# Patient Record
Sex: Female | Born: 1965 | Race: White | Hispanic: No | Marital: Single | Smoking: Never smoker
Health system: Southern US, Community
[De-identification: ages and names within clinical notes are randomized; demographics above are authoritative.]

## PROBLEM LIST (undated history)

## (undated) DIAGNOSIS — H5213 Myopia, bilateral: Secondary | ICD-10-CM

## (undated) DIAGNOSIS — R011 Cardiac murmur, unspecified: Secondary | ICD-10-CM

## (undated) DIAGNOSIS — T7840XA Allergy, unspecified, initial encounter: Secondary | ICD-10-CM

## (undated) DIAGNOSIS — Q909 Down syndrome, unspecified: Secondary | ICD-10-CM

## (undated) DIAGNOSIS — B019 Varicella without complication: Secondary | ICD-10-CM

## (undated) DIAGNOSIS — F79 Unspecified intellectual disabilities: Secondary | ICD-10-CM

## (undated) HISTORY — DX: Allergy, unspecified, initial encounter: T78.40XA

## (undated) HISTORY — DX: Down syndrome, unspecified: Q90.9

## (undated) HISTORY — DX: Cardiac murmur, unspecified: R01.1

## (undated) HISTORY — DX: Varicella without complication: B01.9

---

## 2011-11-18 HISTORY — PX: KNEE SURGERY: SHX244

## 2013-12-14 ENCOUNTER — Encounter: Payer: Self-pay | Admitting: Physician Assistant

## 2013-12-14 ENCOUNTER — Ambulatory Visit (INDEPENDENT_AMBULATORY_CARE_PROVIDER_SITE_OTHER): Payer: PRIVATE HEALTH INSURANCE | Admitting: Physician Assistant

## 2013-12-14 VITALS — BP 132/74 | HR 96 | Temp 97.4°F | Resp 16 | Wt 174.0 lb

## 2013-12-14 DIAGNOSIS — R4689 Other symptoms and signs involving appearance and behavior: Secondary | ICD-10-CM

## 2013-12-14 DIAGNOSIS — Z0000 Encounter for general adult medical examination without abnormal findings: Secondary | ICD-10-CM

## 2013-12-14 DIAGNOSIS — Q909 Down syndrome, unspecified: Secondary | ICD-10-CM

## 2013-12-14 DIAGNOSIS — F3289 Other specified depressive episodes: Secondary | ICD-10-CM

## 2013-12-14 DIAGNOSIS — F919 Conduct disorder, unspecified: Secondary | ICD-10-CM

## 2013-12-14 DIAGNOSIS — F32A Depression, unspecified: Secondary | ICD-10-CM

## 2013-12-14 DIAGNOSIS — R413 Other amnesia: Secondary | ICD-10-CM

## 2013-12-14 DIAGNOSIS — F329 Major depressive disorder, single episode, unspecified: Secondary | ICD-10-CM

## 2013-12-14 DIAGNOSIS — Z23 Encounter for immunization: Secondary | ICD-10-CM

## 2013-12-14 NOTE — Progress Notes (Signed)
Subjective:   Patient ID: Jasmine Ramirez, female    DOB: 08-Dec-1965, 48 y.o.   MRN: [id number]  HPI Comments: Patient is a 48 year old female who presents to the clinic to establish care. Patient has Down syndrome, is accompanied by her sister, Miki Kinsickey, who is primary care giver and historian. Sister reports she and her husband and patient have recently relocated to this area. Relocated from [place] where patient's father has remained. Father was handling patient's needs for most part however, is aging and less able to do all that is required.  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 Since the move patient does not want to be working and sister explains patient is acting out by lying, throwing away keepsakes, throwing away/ripping up bus tickets, having thought that her father is in the attic and left without saying good bye to her etc. Sister has concerns patient may be experiencing depression. While in [place] patient's provider was starting process to do "baseline Alzheimer's testing" and the sister would like to initiate the process her, stating patient seems to have some memory issues. Family will sign for record release and is requesting referral to GYN, Neurologist and Behavior Medicine. Last mammogram in 2014. Would like flu vaccine.    Review of Systems  Constitutional: Negative for chills and fatigue.  HENT: Negative for ear pain and trouble swallowing.        Wears hearing aids, patient threw out  Eyes: Negative for pain and visual disturbance.  Respiratory: Negative for cough.   Cardiovascular: Negative for chest pain and palpitations.  Gastrointestinal: Negative for nausea, diarrhea and constipation.  Genitourinary: Negative for dysuria.  Skin: Negative for rash.  Neurological: Negative for dizziness, weakness, light-headedness and numbness.  All other  systems reviewed and are negative.      Objective:   Physical Exam  Vitals reviewed. Constitutional: She appears well-developed and well-nourished. No distress.  HENT:  Head: Normocephalic and atraumatic.  Right Ear: Tympanic membrane, external ear and ear canal normal.  Left Ear: Tympanic membrane, external ear and ear canal normal.  Nose: Nose normal.  Mouth/Throat: Uvula is midline, oropharynx is clear and moist and mucous membranes are normal.  Cerumen in right canal removed with loop, patient tolerated well.   Eyes: Conjunctivae are normal. Pupils are equal, round, and reactive to light.  Unable to perform EOM   Neck: Trachea normal and normal range of motion.  Cardiovascular: Normal rate and regular rhythm.  Exam reveals no gallop and no friction rub.   No murmur heard. Pulses:      Radial pulses are 2+ on the right side, and 2+ on the left side.       Posterior tibial pulses are 2+ on the right side, and 2+ on the left side.  Pulmonary/Chest: Effort normal and breath sounds normal. She has no wheezes. She has no rhonchi. She has no rales.  Abdominal: Soft. Normal appearance and bowel sounds are normal. There is no tenderness.  Genitourinary:  Deferred to GYN  Musculoskeletal: Normal range of motion.  FROM U/LE bilateral  Lymphadenopathy:    She has no cervical adenopathy.       Right: No supraclavicular adenopathy present.       Left: No supraclavicular adenopathy present.  Neurological: She is alert. She has normal strength. Coordination and gait normal.  Reflex  Scores:      Patellar reflexes are 2+ on the right side and 2+ on the left side. Skin: Skin is warm and dry.  Psychiatric: She has a normal mood and affect. Her behavior is normal.   Past Medical History  Diagnosis Date  . Chicken pox   . Allergy   . Heart murmur   . Down syndrome    Family History  Problem Relation Age of Onset  . Cancer Father     Prostate  . Diabetes Father   . Cancer Maternal  Grandmother     Breast  . Cancer Paternal Grandfather     Colon   History   Social History  . Marital Status: Single    Spouse Name: N/A    Number of Children: N/A  . Years of Education: N/A   Social History Main Topics  . Smoking status: Never Smoker   . Smokeless tobacco: None  . Alcohol Use: None  . Drug Use: None  . Sexual Activity: None   Other Topics Concern  . None   Social History Narrative  . None   Past Surgical History  Procedure Laterality Date  . Knee surgery  2013      Assessment & Plan:   Patient here to establish care.   Labs ordered and will be reviewed.  Flu vaccine updated.  Patient here with sister, sister very concerned regarding patients recent behavior and acting up.  Discussed counseling for patient and sister, sister states was hoping they could do this, is apprehensive to start oral medications. Counseled sister on use of Debrox to assist in preventing cerumen impaction. Patient recently seen at ENT for impaction removal.   Provided referral to  GYN Neuro - for alzheimer assessment, non -acute Behavior medicine

## 2013-12-14 NOTE — Patient Instructions (Signed)
It was great meeting you today Victorino DikeJennifer and WiltonNickey!  I have made referrals as discussed. Depression, Adult Depression is feeling sad, low, down in the dumps, blue, gloomy, or empty. In general, there are two kinds of depression:  Normal sadness or grief. This can happen after something upsetting. It often goes away on its own within 2 weeks. After losing a loved one (bereavement), normal sadness and grief may last longer than two weeks. It usually gets better with time.  Clinical depression. This kind lasts longer than normal sadness or grief. It keeps you from doing the things you normally do in life. It is often hard to function at home, work, or at school. It may affect your relationships with others. Treatment is often needed. GET HELP RIGHT AWAY IF:  You have thoughts about hurting yourself or others.  You lose touch with reality (psychotic symptoms). You may:  See or hear things that are not real.  Have untrue beliefs about your life or people around you.  Your medicine is giving you problems. MAKE SURE YOU:  Understand these instructions.  Will watch your condition.  Will get help right away if you are not doing well or get worse. Document Released: 12/06/2010 Document Revised: 07/28/2012 Document Reviewed: 03/04/2012 [facility] Patient Information 2014 [place] [place].

## 2013-12-14 NOTE — Progress Notes (Signed)
Pre-visit discussion using our clinic review tool. No additional management support is needed unless otherwise documented below in the visit note.  

## 2014-01-03 ENCOUNTER — Ambulatory Visit: Payer: PRIVATE HEALTH INSURANCE | Admitting: Psychiatry

## 2014-01-11 ENCOUNTER — Ambulatory Visit: Payer: PRIVATE HEALTH INSURANCE | Admitting: Psychiatry

## 2014-01-12 ENCOUNTER — Ambulatory Visit: Payer: PRIVATE HEALTH INSURANCE | Admitting: Psychiatry

## 2014-01-25 ENCOUNTER — Ambulatory Visit (INDEPENDENT_AMBULATORY_CARE_PROVIDER_SITE_OTHER): Payer: PRIVATE HEALTH INSURANCE | Admitting: Neurology

## 2014-01-25 ENCOUNTER — Encounter: Payer: Self-pay | Admitting: Neurology

## 2014-01-25 VITALS — BP 118/68 | HR 70 | Temp 98.5°F | Resp 16 | Ht <= 58 in | Wt 175.7 lb

## 2014-01-25 DIAGNOSIS — F919 Conduct disorder, unspecified: Secondary | ICD-10-CM

## 2014-01-25 DIAGNOSIS — R4689 Other symptoms and signs involving appearance and behavior: Secondary | ICD-10-CM

## 2014-01-25 DIAGNOSIS — F329 Major depressive disorder, single episode, unspecified: Secondary | ICD-10-CM

## 2014-01-25 DIAGNOSIS — F32A Depression, unspecified: Secondary | ICD-10-CM

## 2014-01-25 DIAGNOSIS — R4189 Other symptoms and signs involving cognitive functions and awareness: Secondary | ICD-10-CM

## 2014-01-25 DIAGNOSIS — F3289 Other specified depressive episodes: Secondary | ICD-10-CM

## 2014-01-25 DIAGNOSIS — Q909 Down syndrome, unspecified: Secondary | ICD-10-CM

## 2014-01-25 NOTE — Patient Instructions (Addendum)
I would first focus on treating the depression first, particularly since these symptoms started after moving.  We will check a B12 and thyroid levels.  I would like to re-evaluate in 6 months.  Call with questions or concerns.

## 2014-01-25 NOTE — Progress Notes (Signed)
NEUROLOGY CONSULTATION NOTE  Jasmine Ramirez MRN: [id number] DOB: 05/22/1966  Referring provider: Baltazar Apo, PA-C Primary care provider: Baltazar Apo, PA-C  Reason for consult:  Evaluate cognition.  HISTORY OF PRESENT ILLNESS: Jasmine Ramirez is a 48 year old right-handed woman with history of Down syndrome who presents for evaluation of memory problems.  She is accompanied by her sister..  Records and images were personally reviewed where available.    For over 20 years, she lived in an independent assisted living group home in [place].  She was very social and engaged in many activities.  She also worked part-time as a Public affairs consultant at [employer].  Her father was her primary caregiver and would take her to all her appointments.  At baseline, she is able to communicate fairly well.  She is able to read and write short phrases.  She is oriented to time and place.    She moved to [place] from [place] with her sister, brother-in-law and nephew back in August 2014.  Since her move, she has been a little more depressed.  Her nephew moved out and joined the [employer] in January 2015.  Around this time, she had a drastic decline in cognition and behavior.  After her nephew left, she would accuse him of having stolen her possessions.  Other times, she would be screaming out for him.  She would speak to people that weren't there.  One time, she was found in the middle of the night packing her suitcase.  She said she was packing for a trip.  She refuses to wear her hearing aids.  She is more fatigued.  She was ripping up bus tickets and throws away pictures.  When she is confronted, she will say "I don't know; I'm confused, I'm mixed up; I'm sorry, I apologize."  She is not oriented to time or place as much.  She has been much more depressed.  She wasn't eating as much.  She would go upstairs to her room and not watch her favorite TV shows.  She still is obsessive about keeping her  room clean.  She does not work, but she goes to [employer].  A caretaker was hired to come in the mornings.  She is able to perform her ADLs and the caregiver makes her breakfast.  They will talk or go for walks.  Over the past few weeks, her symptoms have improved and leveled off, but she is still not at baseline as she was prior to moving in August.  PAST MEDICAL HISTORY: Past Medical History  Diagnosis Date  . Chicken pox   . Allergy   . Heart murmur   . Down syndrome   . Hearing loss     PAST SURGICAL HISTORY: Past Surgical History  Procedure Laterality Date  . Knee surgery  2013    MEDICATIONS: Current Outpatient Prescriptions on File Prior to Visit  Medication Sig Dispense Refill  . Cetirizine HCl (ZYRTEC PO) Take by mouth.      . Multiple Vitamins-Minerals (MULTIVITAMIN PO) Take by mouth.       No current facility-administered medications on file prior to visit.    ALLERGIES: No Known Allergies  FAMILY HISTORY: Family History  Problem Relation Age of Onset  . Cancer Father     Prostate  . Diabetes Father   . Cancer Maternal Grandmother     Breast  . Cancer Paternal Grandfather     Colon    SOCIAL HISTORY: History  Social History  . Marital Status: Single    Spouse Name: N/A    Number of Children: N/A  . Years of Education: N/A   Occupational History  . Not on file.   Social History Main Topics  . Smoking status: Never Smoker   . Smokeless tobacco: Not on file  . Alcohol Use: Not on file  . Drug Use: Not on file  . Sexual Activity: Not on file   Other Topics Concern  . Not on file   Social History Narrative  . No narrative on file    REVIEW OF SYSTEMS: Constitutional: No fevers, chills, or sweats, no generalized fatigue, change in appetite Eyes: No visual changes, double vision, eye pain Ear, nose and throat: No hearing loss, ear pain, nasal congestion, sore throat Cardiovascular: No chest pain, palpitations Respiratory:  No shortness of  breath at rest or with exertion, wheezes GastrointestinaI: No nausea, vomiting, diarrhea, abdominal pain, fecal incontinence Genitourinary:  No dysuria, urinary retention or frequency Musculoskeletal:  No neck pain, back pain Integumentary: No rash, pruritus, skin lesions Neurological: as above Psychiatric: No depression, insomnia, anxiety Endocrine: No palpitations, fatigue, diaphoresis, mood swings, change in appetite, change in weight, increased thirst Hematologic/Lymphatic:  No anemia, purpura, petechiae. Allergic/Immunologic: no itchy/runny eyes, nasal congestion, recent allergic reactions, rashes  PHYSICAL EXAM: Filed Vitals:   01/25/14 1237  BP: 118/68  Pulse: 70  Temp: 98.5 F (36.9 C)  Resp: 16   General: No acute distress Head:  Normocephalic/atraumatic Neck: supple, no paraspinal tenderness, full range of motion Back: No paraspinal tenderness Heart: regular rate and rhythm Lungs: Clear to auscultation bilaterally. Vascular: No carotid bruits. Neurological Exam: Mental status: alert and oriented to person, day of week and month, but not year or place.  Speech fluent.  She was able to name, repeat, and read.  Unable to follow 3 step commands across midline.  Unable to spell WORLD backwards but can spell simpler words such as CAT backwards (which is baseline).  Unable to recall 3 words after a couple of minutes.  Able to name.  Unable to repeat "no ifs, ands and buts".  Able to write her name but did not write a brief sentence as requested.  Able to copy intersecting pentagons correctly.  MMSE 7/30 Cranial nerves: CN I: not tested CN II: pupils equal, round and reactive to light, visual fields intact, fundi unremarkable. CN III, IV, VI:  full range of motion, no nystagmus, no ptosis CN V: facial sensation intact CN VII: upper and lower face symmetric CN VIII: hearing reduced bilaterally CN IX, X: gag intact, uvula midline CN XI: sternocleidomastoid and trapezius muscles  intact CN XII: tongue midline Bulk & Tone: normal, no fasciculations. Motor: 5/5 throughout Sensation: pinprick and vibration intact. Deep Tendon Reflexes: 2+ throughout, toes down Finger to nose testing: no dysmetria Gait: mildly wide-based but steady stance and stride.  Able to turn. Romberg negative.  IMPRESSION: Down Syndrome Cognitive impairment and behavioral changes.  At this point, I would favor that it is related to depression over dementia.  The sudden onset of symptoms timed with multiple changes, such as moving to another state, moving away from her father, leaving her friends and job, and her nephew leaving, have likely contributed to this.  Also, it has improved (although not at baseline) over the past few weeks.  PLAN: 1.  I would like to see her in 6 months after having started therapy with Behavioral Medicine for a re-evaluation. 2.  Check B12  and TSH.  60 minutes spent with patient, over 50% spent counseling and coordinating care.  Thank you for allowing me to take part in the care of this patient.  Shon Millet, DO  CC:  Baltazar Apo, PA-C

## 2014-01-26 LAB — VITAMIN B12: Vitamin B-12: 308 pg/mL (ref 211–911)

## 2014-01-26 LAB — TSH: TSH: 3.034 u[IU]/mL (ref 0.350–4.500)

## 2014-01-27 LAB — METHYLMALONIC ACID, SERUM: Methylmalonic Acid, Quant: 0.35 umol/L (ref <0.40)

## 2014-01-31 ENCOUNTER — Ambulatory Visit (INDEPENDENT_AMBULATORY_CARE_PROVIDER_SITE_OTHER): Payer: PRIVATE HEALTH INSURANCE | Admitting: Psychiatry

## 2014-01-31 DIAGNOSIS — F4324 Adjustment disorder with disturbance of conduct: Secondary | ICD-10-CM

## 2014-02-01 ENCOUNTER — Encounter: Payer: TRICARE For Life (TFL) | Admitting: Obstetrics & Gynecology

## 2014-02-22 ENCOUNTER — Ambulatory Visit (INDEPENDENT_AMBULATORY_CARE_PROVIDER_SITE_OTHER): Payer: PRIVATE HEALTH INSURANCE | Admitting: Psychiatry

## 2014-02-22 DIAGNOSIS — F4324 Adjustment disorder with disturbance of conduct: Secondary | ICD-10-CM

## 2014-02-27 ENCOUNTER — Encounter: Payer: Self-pay | Admitting: Internal Medicine

## 2014-02-27 ENCOUNTER — Ambulatory Visit (INDEPENDENT_AMBULATORY_CARE_PROVIDER_SITE_OTHER): Payer: PRIVATE HEALTH INSURANCE | Admitting: Internal Medicine

## 2014-02-27 ENCOUNTER — Telehealth: Payer: Self-pay

## 2014-02-27 ENCOUNTER — Other Ambulatory Visit: Payer: PRIVATE HEALTH INSURANCE

## 2014-02-27 VITALS — BP 120/72 | HR 50 | Temp 98.6°F | Wt 175.5 lb

## 2014-02-27 DIAGNOSIS — H6123 Impacted cerumen, bilateral: Secondary | ICD-10-CM

## 2014-02-27 DIAGNOSIS — Q909 Down syndrome, unspecified: Secondary | ICD-10-CM

## 2014-02-27 DIAGNOSIS — H9190 Unspecified hearing loss, unspecified ear: Secondary | ICD-10-CM

## 2014-02-27 DIAGNOSIS — F32A Depression, unspecified: Secondary | ICD-10-CM | POA: Insufficient documentation

## 2014-02-27 DIAGNOSIS — F3289 Other specified depressive episodes: Secondary | ICD-10-CM

## 2014-02-27 DIAGNOSIS — H6120 Impacted cerumen, unspecified ear: Secondary | ICD-10-CM

## 2014-02-27 DIAGNOSIS — F329 Major depressive disorder, single episode, unspecified: Secondary | ICD-10-CM | POA: Insufficient documentation

## 2014-02-27 MED ORDER — PAROXETINE HCL 10 MG PO TABS: 10.0000 mg | ORAL_TABLET | Freq: Every day | ORAL | Status: DC

## 2014-02-27 MED ORDER — PAROXETINE HCL 10 MG PO TABS
10.0000 mg | ORAL_TABLET | Freq: Every day | ORAL | Status: DC
Start: 2014-02-27 — End: 2015-03-26

## 2014-02-27 NOTE — Telephone Encounter (Signed)
MD informed and labs entered.  Tried to call the sister of this patient to inform to bring the patient back in at convenience to do labs.  Had to leave a message.

## 2014-02-27 NOTE — Patient Instructions (Signed)
It was good to see you today.  We have reviewed your prior records including labs and tests today  Test(s) ordered today. Your results will be released to MyChart (or called to you) after review, usually within 72hours after test completion. If any changes need to be made, you will be notified at that same time.  Medications reviewed and updated Begin low-dose generic Paxil 10 mg once daily for depression and behavioral changes - no other changes recommended at this time. Your prescription(s) have been submitted to your pharmacy. Please take as directed and contact our office if you believe you are having problem(s) with the medication(s).  Your ears have been irrigated of wax today -  we'll make referral to audiologist for hearing test and review of hearing aides/ear molds . Our office will contact you regarding appointment(s) once made.  Please schedule followup in 6-8 weeks to review medications , call sooner if problems.

## 2014-02-27 NOTE — Progress Notes (Signed)
Subjective:    Patient ID: Jasmine DodgeJennifer Ramirez, female    DOB: 03/17/1966, 48 y.o.   MRN: [id number]  HPI  Patient here today for follow up.   New to me today - previously seen by [name], PA.    Chronic medical issues reviewed. [name]. Recommendation to check estrogen level to look for any other causes of depression.    Neurology consult 01/2014 reviewed.    Sister reports mood improving, still not at baseline.  Also requesting cerumen removal today.    Past Medical History  Diagnosis Date  . Chicken pox   . Allergy   . Heart murmur   . Down syndrome   . Hearing loss     Review of Systems  Constitutional: Negative for fever, chills, activity change and appetite change.  HENT: Negative for congestion, postnasal drip, sinus pressure and sore throat.   Respiratory: Negative for cough, choking, shortness of breath and wheezing.   Cardiovascular: Negative for chest pain, palpitations and leg swelling.  Gastrointestinal: Negative for nausea, vomiting, diarrhea and constipation.  Musculoskeletal: Negative for arthralgias, back pain and myalgias.  Skin: Negative for rash and wound.  Neurological: Negative for dizziness, seizures, syncope, weakness and headaches.  Psychiatric/Behavioral: Positive for behavioral problems (chronic - improving), dysphoric mood and decreased concentration. Negative for sleep disturbance.       Objective:   Physical Exam  Constitutional: She appears well-developed and well-nourished. No distress.  Typical Down's features -sister at side  HENT:  Head: Normocephalic and atraumatic.  Mouth/Throat: No oropharyngeal exudate.  Bilateral cerumen impaction  Eyes: Conjunctivae are normal. Pupils are equal, round, and reactive to light. Right eye exhibits no discharge. Left eye exhibits no discharge.  Neck: Normal range of motion. Neck supple. No thyromegaly present.  Cardiovascular: Normal  rate, regular rhythm and normal heart sounds.   Pulmonary/Chest: Effort normal and breath sounds normal. No respiratory distress. She has no wheezes.  Abdominal: Soft. Bowel sounds are normal. She exhibits no distension. There is no tenderness.  Musculoskeletal: Normal range of motion. She exhibits no edema and no tenderness.  Lymphadenopathy:    She has no cervical adenopathy.  Neurological: She is alert.  Skin: Skin is warm and dry. No rash noted. She is not diaphoretic.  Psychiatric: She has a normal mood and affect. Her behavior is normal. Judgment and thought content normal.    Wt Readings from Last 3 Encounters:  02/27/14 175 lb 8 oz (79.606 kg)  01/25/14 175 lb 11.2 oz (79.697 kg)  12/14/13 174 lb (78.926 kg)   BP Readings from Last 3 Encounters:  02/27/14 120/72  01/25/14 118/68  12/14/13 132/74   Lab Results  Component Value Date   TSH 3.034 01/25/2014   Procedure: wax removal, bilateral Reason: wax impaction, bilateral Risks and benefits of procedure discussed with the patient who agrees to proceed. Ear(s) irrigated with warm water. Large amount of wax removed. Instrumentation with metal ear loop was performed to accomplish wax removal. the patient tolerated procedure well.     Assessment & Plan:   Problem List Items Addressed This Visit   Depression     sister reports improvements in behavior since initiating counseling with behavioral medicine ( Dr. [name]) Given neurology opinion that depression more prevalent and dementia, we'll initiate SSRI therapy at low dose at this time paxil 10,g qd - we reviewed potential risk/benefit and possible side effects - pt understands and agrees to same  Followup  in 6-8 weeks to review symptoms and titer medication as needed, sister agrees to call if symptoms worse or unimproved No SI/HI evident    Relevant Medications      PARoxetine (PAXIL) tablet   Down syndrome - Primary     Progressive disease with behavioral changes noted  in past 9 months [facility] -felt depression more likely than dementia contributing to underlying behavioral change Review screening labs today to ensure no underlying metabolic abnormality Soft heart murmur without change, consider 2-D echo at future date to watch for MR Sister reports contemplation of [place] back [place] lived for [place] years -will help support medically until which time a decision was made to return there    Relevant Orders      Ambulatory referral to Audiology    Other Visit Diagnoses   Impacted cerumen of both ears        Change in hearing        Relevant Orders       Ambulatory referral to Audiology      Cerumen irrigation performed today, see procedure note above. Will refer to audiology as noted

## 2014-02-27 NOTE — Telephone Encounter (Signed)
The patient called from the lab downstairs hoping to get orders updated.  Please call pt back as soon as possible, thanks!

## 2014-02-27 NOTE — Assessment & Plan Note (Signed)
sister reports improvements in behavior since initiating counseling with behavioral medicine ( Dr. [name]) Given neurology opinion that depression more prevalent and dementia, we'll initiate SSRI therapy at low dose at this time paxil 10,g qd - we reviewed potential risk/benefit and possible side effects - pt understands and agrees to same  Followup in 6-8 weeks to review symptoms and titer medication as needed, sister agrees to call if symptoms worse or unimproved No SI/HI evident

## 2014-02-27 NOTE — Progress Notes (Signed)
Pre visit review using our clinic review tool, if applicable. No additional management support is needed unless otherwise documented below in the visit note. 

## 2014-02-27 NOTE — Assessment & Plan Note (Signed)
Progressive disease with behavioral changes noted in past 9 months since [facility] -felt depression more likely than dementia contributing to underlying behavioral change Review screening labs today to ensure no underlying metabolic abnormality Soft heart murmur without change, consider 2-D echo at future date to watch for MR Sister reports contemplation of [place] lived for [place] years -will help support medically until which time a decision was made to return there

## 2014-08-02 ENCOUNTER — Ambulatory Visit: Payer: TRICARE For Life (TFL) | Admitting: Neurology

## 2015-03-26 ENCOUNTER — Ambulatory Visit (INDEPENDENT_AMBULATORY_CARE_PROVIDER_SITE_OTHER): Payer: PRIVATE HEALTH INSURANCE | Admitting: Internal Medicine

## 2015-03-26 ENCOUNTER — Encounter: Payer: Self-pay | Admitting: Internal Medicine

## 2015-03-26 VITALS — BP 100/80 | HR 56 | Temp 98.5°F | Ht <= 58 in | Wt 177.5 lb

## 2015-03-26 DIAGNOSIS — Q909 Down syndrome, unspecified: Secondary | ICD-10-CM | POA: Diagnosis not present

## 2015-03-26 DIAGNOSIS — F79 Unspecified intellectual disabilities: Secondary | ICD-10-CM | POA: Diagnosis not present

## 2015-03-26 NOTE — Progress Notes (Signed)
Pre visit review using our clinic review tool, if applicable. No additional management support is needed unless otherwise documented below in the visit note. 

## 2015-03-26 NOTE — Progress Notes (Signed)
   Subjective:    Patient ID: Jasmine Ramirez, female    DOB: Apr 13, 1966, 49 y.o.   MRN: [id number]  HPI She is a 49 year old female with Down syndrome. She has [place] in [place] group home [place]. Her father who was her healthcare power of attorney died of leukemia in October 2015 and [place] with her sister Jasmine Ramirez.  She was depressed leaving [place] and with the death of her father. There was difficulty getting coverage for Paxil last Fall and it was never taken. She's on no prescription drugs @ this time.  According to her sister she has no active symptoms.  Document completion to allow continuation of the child support is necessary in reference to her mental ability. Additionally mental status evaluation is needed by psychologist or psychiatrist.   Review of Systems Review of systems cannot be completed. When she is asked any question ; she may reply positively but then quickly says "no, I am fine".  Her sister does not feel she's depressed at this time .       Objective:   Physical Exam Pertinent or positive findings include: Subtle Down's findings facially and of body habitus. Decreased hearing.  Increased second heart sound.  Minimal crepitus of the knees.  General appearance :adequately nourished; in no distress. Eyes: No conjunctival inflammation or scleral icterus is present. Oral exam:  Lips and gums are healthy appearing.There is no oropharyngeal erythema or exudate noted. Dental hygiene is good. Heart:  Normal rate and regular rhythm. S1 normal without gallop, murmur, click, rub or other extra sounds  Lungs:Chest clear to auscultation; no wheezes, rhonchi,rales ,or rubs present.No increased work of breathing.  Abdomen: bowel sounds normal, soft and non-tender without masses, organomegaly or hernias noted.  No guarding or rebound.  Vascular : all pulses equal ; no bruits present. Skin:Warm & dry.  Intact  without suspicious lesions or rashes ; no tenting or jaundice  Lymphatic: No lymphadenopathy is noted about the head, neck, axilla Neuro: Strength, tone & DTRs normal.        Assessment & Plan:  #1 Down syndrome; she is mentally incapable of caring for self or conducting her financial affairs  Plan: Form completed. Referral will be made to our psychology associates for the mental status evaluation.

## 2015-03-26 NOTE — Patient Instructions (Signed)
The Psychology referral will be scheduled and you'll be notified of the time.Please call the Referral Co-Ordinator @ [phone] if you have not been notified of appointment time within 7-10 days. 

## 2015-05-04 ENCOUNTER — Ambulatory Visit (INDEPENDENT_AMBULATORY_CARE_PROVIDER_SITE_OTHER): Payer: PRIVATE HEALTH INSURANCE | Admitting: Psychology

## 2015-05-04 DIAGNOSIS — F71 Moderate intellectual disabilities: Secondary | ICD-10-CM

## 2015-05-04 DIAGNOSIS — F438 Other reactions to severe stress: Secondary | ICD-10-CM | POA: Diagnosis not present

## 2015-06-29 ENCOUNTER — Ambulatory Visit (INDEPENDENT_AMBULATORY_CARE_PROVIDER_SITE_OTHER): Payer: PRIVATE HEALTH INSURANCE | Admitting: Psychology

## 2015-06-29 DIAGNOSIS — F430 Acute stress reaction: Secondary | ICD-10-CM | POA: Diagnosis not present

## 2015-06-29 DIAGNOSIS — F70 Mild intellectual disabilities: Secondary | ICD-10-CM | POA: Diagnosis not present

## 2015-10-09 ENCOUNTER — Ambulatory Visit (INDEPENDENT_AMBULATORY_CARE_PROVIDER_SITE_OTHER): Payer: PRIVATE HEALTH INSURANCE | Admitting: Internal Medicine

## 2015-10-09 ENCOUNTER — Other Ambulatory Visit (INDEPENDENT_AMBULATORY_CARE_PROVIDER_SITE_OTHER): Payer: Medicare Other

## 2015-10-09 ENCOUNTER — Encounter: Payer: Self-pay | Admitting: Internal Medicine

## 2015-10-09 VITALS — BP 100/70 | HR 48 | Wt 167.0 lb

## 2015-10-09 DIAGNOSIS — Z0000 Encounter for general adult medical examination without abnormal findings: Secondary | ICD-10-CM

## 2015-10-09 DIAGNOSIS — R202 Paresthesia of skin: Secondary | ICD-10-CM | POA: Diagnosis not present

## 2015-10-09 DIAGNOSIS — Z23 Encounter for immunization: Secondary | ICD-10-CM | POA: Diagnosis not present

## 2015-10-09 DIAGNOSIS — R21 Rash and other nonspecific skin eruption: Secondary | ICD-10-CM | POA: Diagnosis not present

## 2015-10-09 DIAGNOSIS — Q909 Down syndrome, unspecified: Secondary | ICD-10-CM | POA: Diagnosis not present

## 2015-10-09 DIAGNOSIS — R7989 Other specified abnormal findings of blood chemistry: Secondary | ICD-10-CM | POA: Insufficient documentation

## 2015-10-09 LAB — HEPATIC FUNCTION PANEL
ALBUMIN: 3.9 g/dL (ref 3.5–5.2)
ALT: 13 U/L (ref 0–35)
AST: 18 U/L (ref 0–37)
Alkaline Phosphatase: 69 U/L (ref 39–117)
Bilirubin, Direct: 0.1 mg/dL (ref 0.0–0.3)
Total Bilirubin: 0.4 mg/dL (ref 0.2–1.2)
Total Protein: 7.1 g/dL (ref 6.0–8.3)

## 2015-10-09 LAB — CBC WITH DIFFERENTIAL/PLATELET
Basophils Absolute: 0 10*3/uL (ref 0.0–0.1)
Basophils Relative: 0.7 % (ref 0.0–3.0)
EOS ABS: 0.1 10*3/uL (ref 0.0–0.7)
Eosinophils Relative: 1.9 % (ref 0.0–5.0)
HCT: 45.5 % (ref 36.0–46.0)
Hemoglobin: 15.1 g/dL — ABNORMAL HIGH (ref 12.0–15.0)
LYMPHS ABS: 2.1 10*3/uL (ref 0.7–4.0)
Lymphocytes Relative: 35.2 % (ref 12.0–46.0)
MCHC: 33.2 g/dL (ref 30.0–36.0)
MCV: 96 fl (ref 78.0–100.0)
MONOS PCT: 6.7 % (ref 3.0–12.0)
Monocytes Absolute: 0.4 10*3/uL (ref 0.1–1.0)
NEUTROS ABS: 3.3 10*3/uL (ref 1.4–7.7)
Neutrophils Relative %: 55.5 % (ref 43.0–77.0)
PLATELETS: 212 10*3/uL (ref 150.0–400.0)
RBC: 4.73 Mil/uL (ref 3.87–5.11)
RDW: 15.1 % (ref 11.5–15.5)
WBC: 6 10*3/uL (ref 4.0–10.5)

## 2015-10-09 LAB — BASIC METABOLIC PANEL
BUN: 17 mg/dL (ref 6–23)
CO2: 30 mEq/L (ref 19–32)
Calcium: 9.3 mg/dL (ref 8.4–10.5)
Chloride: 100 mEq/L (ref 96–112)
Creatinine, Ser: 0.8 mg/dL (ref 0.40–1.20)
GFR: 80.35 mL/min (ref >60.00)
Glucose, Bld: 93 mg/dL (ref 70–99)
POTASSIUM: 3.8 meq/L (ref 3.5–5.1)
Sodium: 137 mEq/L (ref 135–145)

## 2015-10-09 LAB — VITAMIN B12: Vitamin B-12: 259 pg/mL (ref 211–911)

## 2015-10-09 LAB — TSH: TSH: 6.16 u[IU]/mL — AB (ref 0.35–4.50)

## 2015-10-09 MED ORDER — TRIAMCINOLONE ACETONIDE 0.5 % EX OINT: 1.0000 "application " | TOPICAL_OINTMENT | Freq: Four times a day (QID) | CUTANEOUS | Status: DC

## 2015-10-09 MED ORDER — VITAMIN B-12 500 MCG SL SUBL: SUBLINGUAL_TABLET | SUBLINGUAL | Status: DC

## 2015-10-09 NOTE — Assessment & Plan Note (Signed)
Group home placement pending

## 2015-10-09 NOTE — Progress Notes (Signed)
Pre visit review using our clinic review tool, if applicable. No additional management support is needed unless otherwise documented below in the visit note. 

## 2015-10-09 NOTE — Assessment & Plan Note (Signed)
11/16 slightly elevated TSH Re-check TSH in 3 mo

## 2015-10-09 NOTE — Progress Notes (Signed)
Subjective:  Patient ID: Jasmine Ramirez, female    DOB: 01/02/1966  Age: 49 y.o. MRN: [id number]  CC: No chief complaint on file.   HPI Nohealani Medinger presents for well exam for a group home admission. C/o rash on lips attributed to a reaction to lotion >7days  Outpatient Prescriptions Prior to Visit  Medication Sig Dispense Refill  . Multiple Vitamins-Minerals (MULTIVITAMIN PO) Take by mouth.    . Cetirizine HCl (ZYRTEC PO) Take by mouth.    . mometasone (NASONEX) 50 MCG/ACT nasal spray Place 2 sprays into the nose daily.     No facility-administered medications prior to visit.    ROS Review of Systems  Constitutional: Negative for chills, activity change, appetite change, fatigue and unexpected weight change.  HENT: Negative for congestion, mouth sores and sinus pressure.   Eyes: Negative for visual disturbance.  Respiratory: Negative for cough and chest tightness.   Gastrointestinal: Negative for nausea and abdominal pain.  Genitourinary: Negative for frequency, difficulty urinating and vaginal pain.  Musculoskeletal: Negative for back pain and gait problem.  Skin: Positive for rash. Negative for pallor.  Neurological: Negative for dizziness, tremors, weakness, numbness and headaches.  Psychiatric/Behavioral: Positive for confusion and decreased concentration. Negative for suicidal ideas, behavioral problems, sleep disturbance, self-injury and agitation. The patient is not nervous/anxious.     Objective:  BP 100/70 mmHg  Pulse 48  Wt 167 lb (75.751 kg)  SpO2 97%  BP Readings from Last 3 Encounters:  10/09/15 100/70  03/26/15 100/80  02/27/14 120/72    Wt Readings from Last 3 Encounters:  10/09/15 167 lb (75.751 kg)  03/26/15 177 lb 8 oz (80.513 kg)  02/27/14 175 lb 8 oz (79.606 kg)    Physical Exam  Constitutional: She appears well-developed. No distress.  HENT:  Head: Normocephalic.  Right Ear: External ear normal.  Left Ear: External ear normal.    Nose: Nose normal.  Mouth/Throat: Oropharynx is clear and moist.  Eyes: Conjunctivae are normal. Pupils are equal, round, and reactive to light. Right eye exhibits no discharge. Left eye exhibits no discharge.  Neck: Normal range of motion. Neck supple. No JVD present. No tracheal deviation present. No thyromegaly present.  Cardiovascular: Normal rate, regular rhythm and normal heart sounds.   Pulmonary/Chest: No stridor. No respiratory distress. She has no wheezes.  Abdominal: Soft. Bowel sounds are normal. She exhibits no distension and no mass. There is no tenderness. There is no rebound and no guarding.  Musculoskeletal: She exhibits no edema or tenderness.  Lymphadenopathy:    She has no cervical adenopathy.  Neurological: She displays normal reflexes. No cranial nerve deficit. She exhibits normal muscle tone. Coordination normal.  Skin: No rash noted. No erythema.  Psychiatric: She has a normal mood and affect.  alert and cooperative Lips w/papular rash - extensive Obese  Lab Results  Component Value Date   WBC 6.0 10/09/2015   HGB 15.1* 10/09/2015   HCT 45.5 10/09/2015   PLT 212.0 10/09/2015   GLUCOSE 93 10/09/2015   ALT 13 10/09/2015   AST 18 10/09/2015   NA 137 10/09/2015   K 3.8 10/09/2015   CL 100 10/09/2015   CREATININE 0.80 10/09/2015   BUN 17 10/09/2015   CO2 30 10/09/2015   TSH 6.16* 10/09/2015    Patient was never admitted.  Assessment & Plan:   Diagnoses and all orders for this visit:  Well adult exam -     Hepatic function panel; Future -     CBC  with Differential/Platelet; Future -     Basic metabolic panel; Future -     TSH; Future -     Urinalysis; Future -     Vitamin B12; Future -     Quantiferon tb gold assay  Down syndrome -     Hepatic function panel; Future -     CBC with Differential/Platelet; Future -     Basic metabolic panel; Future -     TSH; Future -     Urinalysis; Future -     Vitamin B12; Future -     Quantiferon tb gold  assay  Paresthesia -     Hepatic function panel; Future -     CBC with Differential/Platelet; Future -     Basic metabolic panel; Future -     TSH; Future -     Urinalysis; Future -     Vitamin B12; Future -     Quantiferon tb gold assay  Need for influenza vaccination -     Flu Vaccine QUAD 36+ mos IM  Need for prophylactic vaccination against Streptococcus pneumoniae (pneumococcus) -     Pneumococcal polysaccharide vaccine 23-valent greater than or equal to 2yo subcutaneous/IM  Need for TD vaccine -     Td vaccine greater than or equal to 7yo preservative free IM  Other orders -     triamcinolone ointment (KENALOG) 0.5 %; Apply 1 application topically 4 (four) times daily. To lips   I have discontinued Ms. Rafalski's Cetirizine HCl (ZYRTEC PO) and mometasone. I am also having her start on triamcinolone ointment. Additionally, I am having her maintain her Multiple Vitamins-Minerals (MULTIVITAMIN PO).  Meds ordered this encounter  Medications  . triamcinolone ointment (KENALOG) 0.5 %    Sig: Apply 1 application topically 4 (four) times daily. To lips    Dispense:  15 g    Refill:  1     Follow-up: No Follow-up on file.  Sonda PrimesAlex Plotnikov, MD

## 2015-10-09 NOTE — Assessment & Plan Note (Signed)
We discussed age appropriate health related issues, including available/recomended screening tests and vaccinations. We discussed a need for adhering to healthy diet and exercise. Labs were reviewed/ordered. All questions were answered. Group home placement pending

## 2015-10-09 NOTE — Assessment & Plan Note (Signed)
11/16 rash on lips - ?allergic vs other Kenalog oint qid Call if new lesions - would start Valtrex

## 2016-12-19 DIAGNOSIS — Z961 Presence of intraocular lens: Secondary | ICD-10-CM | POA: Diagnosis not present

## 2016-12-19 DIAGNOSIS — H5213 Myopia, bilateral: Secondary | ICD-10-CM | POA: Diagnosis not present

## 2016-12-19 DIAGNOSIS — H26492 Other secondary cataract, left eye: Secondary | ICD-10-CM | POA: Diagnosis not present

## 2017-01-28 DIAGNOSIS — F79 Unspecified intellectual disabilities: Secondary | ICD-10-CM | POA: Diagnosis not present

## 2017-01-28 DIAGNOSIS — E039 Hypothyroidism, unspecified: Secondary | ICD-10-CM | POA: Diagnosis not present

## 2017-01-28 DIAGNOSIS — G26 Extrapyramidal and movement disorders in diseases classified elsewhere: Secondary | ICD-10-CM | POA: Diagnosis not present

## 2017-02-11 DIAGNOSIS — E039 Hypothyroidism, unspecified: Secondary | ICD-10-CM | POA: Diagnosis not present

## 2017-02-11 DIAGNOSIS — F79 Unspecified intellectual disabilities: Secondary | ICD-10-CM | POA: Diagnosis not present

## 2017-05-04 DIAGNOSIS — Z1231 Encounter for screening mammogram for malignant neoplasm of breast: Secondary | ICD-10-CM | POA: Diagnosis not present

## 2017-06-16 DIAGNOSIS — N390 Urinary tract infection, site not specified: Secondary | ICD-10-CM | POA: Diagnosis not present

## 2017-06-24 DIAGNOSIS — E039 Hypothyroidism, unspecified: Secondary | ICD-10-CM | POA: Diagnosis not present

## 2017-06-24 DIAGNOSIS — F79 Unspecified intellectual disabilities: Secondary | ICD-10-CM | POA: Diagnosis not present

## 2017-08-12 DIAGNOSIS — F79 Unspecified intellectual disabilities: Secondary | ICD-10-CM | POA: Diagnosis not present

## 2017-08-12 DIAGNOSIS — L0390 Cellulitis, unspecified: Secondary | ICD-10-CM | POA: Diagnosis not present

## 2017-08-12 DIAGNOSIS — E039 Hypothyroidism, unspecified: Secondary | ICD-10-CM | POA: Diagnosis not present

## 2017-09-09 DIAGNOSIS — Z23 Encounter for immunization: Secondary | ICD-10-CM | POA: Diagnosis not present

## 2017-09-09 DIAGNOSIS — E039 Hypothyroidism, unspecified: Secondary | ICD-10-CM | POA: Diagnosis not present

## 2017-09-09 DIAGNOSIS — F79 Unspecified intellectual disabilities: Secondary | ICD-10-CM | POA: Diagnosis not present

## 2017-09-10 DIAGNOSIS — Z23 Encounter for immunization: Secondary | ICD-10-CM | POA: Diagnosis not present

## 2017-10-21 DIAGNOSIS — H9193 Unspecified hearing loss, bilateral: Secondary | ICD-10-CM | POA: Diagnosis not present

## 2017-10-21 DIAGNOSIS — F79 Unspecified intellectual disabilities: Secondary | ICD-10-CM | POA: Diagnosis not present

## 2017-10-21 DIAGNOSIS — E039 Hypothyroidism, unspecified: Secondary | ICD-10-CM | POA: Diagnosis not present

## 2017-11-25 DIAGNOSIS — R6889 Other general symptoms and signs: Secondary | ICD-10-CM | POA: Diagnosis not present

## 2017-11-25 DIAGNOSIS — E559 Vitamin D deficiency, unspecified: Secondary | ICD-10-CM | POA: Diagnosis not present

## 2017-11-25 DIAGNOSIS — Z1329 Encounter for screening for other suspected endocrine disorder: Secondary | ICD-10-CM | POA: Diagnosis not present

## 2017-11-25 DIAGNOSIS — Z13220 Encounter for screening for lipoid disorders: Secondary | ICD-10-CM | POA: Diagnosis not present

## 2017-11-25 DIAGNOSIS — R7309 Other abnormal glucose: Secondary | ICD-10-CM | POA: Diagnosis not present

## 2017-12-02 DIAGNOSIS — N390 Urinary tract infection, site not specified: Secondary | ICD-10-CM | POA: Diagnosis not present

## 2017-12-09 DIAGNOSIS — Z6839 Body mass index (BMI) 39.0-39.9, adult: Secondary | ICD-10-CM | POA: Diagnosis not present

## 2017-12-09 DIAGNOSIS — F79 Unspecified intellectual disabilities: Secondary | ICD-10-CM | POA: Diagnosis not present

## 2017-12-09 DIAGNOSIS — E039 Hypothyroidism, unspecified: Secondary | ICD-10-CM | POA: Diagnosis not present

## 2017-12-09 DIAGNOSIS — N390 Urinary tract infection, site not specified: Secondary | ICD-10-CM | POA: Diagnosis not present

## 2017-12-16 DIAGNOSIS — G4700 Insomnia, unspecified: Secondary | ICD-10-CM | POA: Diagnosis not present

## 2017-12-16 DIAGNOSIS — Z6839 Body mass index (BMI) 39.0-39.9, adult: Secondary | ICD-10-CM | POA: Diagnosis not present

## 2017-12-16 DIAGNOSIS — F79 Unspecified intellectual disabilities: Secondary | ICD-10-CM | POA: Diagnosis not present

## 2017-12-16 DIAGNOSIS — Q909 Down syndrome, unspecified: Secondary | ICD-10-CM | POA: Diagnosis not present

## 2017-12-29 DIAGNOSIS — E039 Hypothyroidism, unspecified: Secondary | ICD-10-CM | POA: Diagnosis not present

## 2017-12-29 DIAGNOSIS — G26 Extrapyramidal and movement disorders in diseases classified elsewhere: Secondary | ICD-10-CM | POA: Diagnosis not present

## 2017-12-29 DIAGNOSIS — F79 Unspecified intellectual disabilities: Secondary | ICD-10-CM | POA: Diagnosis not present

## 2018-02-04 DIAGNOSIS — E039 Hypothyroidism, unspecified: Secondary | ICD-10-CM | POA: Diagnosis not present

## 2018-02-18 DIAGNOSIS — E039 Hypothyroidism, unspecified: Secondary | ICD-10-CM | POA: Diagnosis not present

## 2018-02-18 DIAGNOSIS — F79 Unspecified intellectual disabilities: Secondary | ICD-10-CM | POA: Diagnosis not present

## 2018-02-24 DIAGNOSIS — E039 Hypothyroidism, unspecified: Secondary | ICD-10-CM | POA: Diagnosis not present

## 2018-02-24 DIAGNOSIS — F79 Unspecified intellectual disabilities: Secondary | ICD-10-CM | POA: Diagnosis not present

## 2018-02-24 DIAGNOSIS — Q909 Down syndrome, unspecified: Secondary | ICD-10-CM | POA: Diagnosis not present

## 2018-02-24 DIAGNOSIS — G4700 Insomnia, unspecified: Secondary | ICD-10-CM | POA: Diagnosis not present

## 2018-02-24 DIAGNOSIS — Z0000 Encounter for general adult medical examination without abnormal findings: Secondary | ICD-10-CM | POA: Diagnosis not present

## 2018-02-24 DIAGNOSIS — H9193 Unspecified hearing loss, bilateral: Secondary | ICD-10-CM | POA: Diagnosis not present

## 2018-03-17 DIAGNOSIS — H26492 Other secondary cataract, left eye: Secondary | ICD-10-CM | POA: Diagnosis not present

## 2018-03-17 DIAGNOSIS — Z961 Presence of intraocular lens: Secondary | ICD-10-CM | POA: Diagnosis not present

## 2018-03-17 DIAGNOSIS — H52223 Regular astigmatism, bilateral: Secondary | ICD-10-CM | POA: Diagnosis not present

## 2018-03-17 DIAGNOSIS — H5213 Myopia, bilateral: Secondary | ICD-10-CM | POA: Diagnosis not present

## 2018-03-18 DIAGNOSIS — E039 Hypothyroidism, unspecified: Secondary | ICD-10-CM | POA: Diagnosis not present

## 2018-03-18 DIAGNOSIS — F79 Unspecified intellectual disabilities: Secondary | ICD-10-CM | POA: Diagnosis not present

## 2018-03-24 DIAGNOSIS — E039 Hypothyroidism, unspecified: Secondary | ICD-10-CM | POA: Diagnosis not present

## 2018-03-24 DIAGNOSIS — H52209 Unspecified astigmatism, unspecified eye: Secondary | ICD-10-CM | POA: Diagnosis not present

## 2018-03-24 DIAGNOSIS — H5210 Myopia, unspecified eye: Secondary | ICD-10-CM | POA: Diagnosis not present

## 2018-03-24 DIAGNOSIS — Q909 Down syndrome, unspecified: Secondary | ICD-10-CM | POA: Diagnosis not present

## 2018-03-24 DIAGNOSIS — G4700 Insomnia, unspecified: Secondary | ICD-10-CM | POA: Diagnosis not present

## 2018-03-24 DIAGNOSIS — F79 Unspecified intellectual disabilities: Secondary | ICD-10-CM | POA: Diagnosis not present

## 2018-04-21 DIAGNOSIS — F79 Unspecified intellectual disabilities: Secondary | ICD-10-CM | POA: Diagnosis not present

## 2018-04-21 DIAGNOSIS — Z01419 Encounter for gynecological examination (general) (routine) without abnormal findings: Secondary | ICD-10-CM | POA: Diagnosis not present

## 2018-04-21 DIAGNOSIS — Z0000 Encounter for general adult medical examination without abnormal findings: Secondary | ICD-10-CM | POA: Diagnosis not present

## 2018-04-21 DIAGNOSIS — M25569 Pain in unspecified knee: Secondary | ICD-10-CM | POA: Diagnosis not present

## 2018-04-21 DIAGNOSIS — E039 Hypothyroidism, unspecified: Secondary | ICD-10-CM | POA: Diagnosis not present

## 2018-05-26 DIAGNOSIS — Q909 Down syndrome, unspecified: Secondary | ICD-10-CM | POA: Diagnosis not present

## 2018-05-26 DIAGNOSIS — R946 Abnormal results of thyroid function studies: Secondary | ICD-10-CM | POA: Diagnosis not present

## 2018-05-26 DIAGNOSIS — E785 Hyperlipidemia, unspecified: Secondary | ICD-10-CM | POA: Diagnosis not present

## 2018-05-26 DIAGNOSIS — H9193 Unspecified hearing loss, bilateral: Secondary | ICD-10-CM | POA: Diagnosis not present

## 2018-05-26 DIAGNOSIS — F79 Unspecified intellectual disabilities: Secondary | ICD-10-CM | POA: Diagnosis not present

## 2018-05-26 DIAGNOSIS — E039 Hypothyroidism, unspecified: Secondary | ICD-10-CM | POA: Diagnosis not present

## 2018-06-11 ENCOUNTER — Emergency Department (HOSPITAL_COMMUNITY): Payer: Medicare Other

## 2018-06-11 ENCOUNTER — Inpatient Hospital Stay (HOSPITAL_COMMUNITY)
Admission: EM | Admit: 2018-06-11 | Discharge: 2018-06-19 | DRG: 052 | Disposition: A | Discharge status: Skilled Nursing Facility | Payer: Medicare Other | Attending: Internal Medicine | Admitting: Internal Medicine

## 2018-06-11 ENCOUNTER — Encounter (HOSPITAL_COMMUNITY): Payer: Self-pay | Admitting: Emergency Medicine

## 2018-06-11 DIAGNOSIS — R260 Ataxic gait: Secondary | ICD-10-CM | POA: Diagnosis present

## 2018-06-11 DIAGNOSIS — S14109D Unspecified injury at unspecified level of cervical spinal cord, subsequent encounter: Secondary | ICD-10-CM | POA: Diagnosis not present

## 2018-06-11 DIAGNOSIS — S14109A Unspecified injury at unspecified level of cervical spinal cord, initial encounter: Secondary | ICD-10-CM | POA: Diagnosis not present

## 2018-06-11 DIAGNOSIS — N179 Acute kidney failure, unspecified: Secondary | ICD-10-CM | POA: Diagnosis not present

## 2018-06-11 DIAGNOSIS — S0083XA Contusion of other part of head, initial encounter: Secondary | ICD-10-CM | POA: Diagnosis present

## 2018-06-11 DIAGNOSIS — M2550 Pain in unspecified joint: Secondary | ICD-10-CM | POA: Diagnosis not present

## 2018-06-11 DIAGNOSIS — F0280 Dementia in other diseases classified elsewhere without behavioral disturbance: Secondary | ICD-10-CM | POA: Diagnosis not present

## 2018-06-11 DIAGNOSIS — Z6836 Body mass index (BMI) 36.0-36.9, adult: Secondary | ICD-10-CM

## 2018-06-11 DIAGNOSIS — R6250 Unspecified lack of expected normal physiological development in childhood: Secondary | ICD-10-CM | POA: Diagnosis not present

## 2018-06-11 DIAGNOSIS — G4700 Insomnia, unspecified: Secondary | ICD-10-CM | POA: Diagnosis not present

## 2018-06-11 DIAGNOSIS — E669 Obesity, unspecified: Secondary | ICD-10-CM | POA: Diagnosis present

## 2018-06-11 DIAGNOSIS — M542 Cervicalgia: Secondary | ICD-10-CM | POA: Diagnosis present

## 2018-06-11 DIAGNOSIS — R531 Weakness: Secondary | ICD-10-CM | POA: Diagnosis not present

## 2018-06-11 DIAGNOSIS — R488 Other symbolic dysfunctions: Secondary | ICD-10-CM | POA: Diagnosis not present

## 2018-06-11 DIAGNOSIS — Z79890 Hormone replacement therapy (postmenopausal): Secondary | ICD-10-CM | POA: Diagnosis not present

## 2018-06-11 DIAGNOSIS — S1980XA Other specified injuries of unspecified part of neck, initial encounter: Secondary | ICD-10-CM | POA: Diagnosis not present

## 2018-06-11 DIAGNOSIS — G9589 Other specified diseases of spinal cord: Secondary | ICD-10-CM | POA: Diagnosis not present

## 2018-06-11 DIAGNOSIS — R451 Restlessness and agitation: Secondary | ICD-10-CM | POA: Diagnosis not present

## 2018-06-11 DIAGNOSIS — Z79899 Other long term (current) drug therapy: Secondary | ICD-10-CM

## 2018-06-11 DIAGNOSIS — S14102A Unspecified injury at C2 level of cervical spinal cord, initial encounter: Principal | ICD-10-CM | POA: Diagnosis present

## 2018-06-11 DIAGNOSIS — B019 Varicella without complication: Secondary | ICD-10-CM

## 2018-06-11 DIAGNOSIS — F0281 Dementia in other diseases classified elsewhere with behavioral disturbance: Secondary | ICD-10-CM | POA: Diagnosis not present

## 2018-06-11 DIAGNOSIS — K219 Gastro-esophageal reflux disease without esophagitis: Secondary | ICD-10-CM | POA: Diagnosis not present

## 2018-06-11 DIAGNOSIS — T7840XA Allergy, unspecified, initial encounter: Secondary | ICD-10-CM

## 2018-06-11 DIAGNOSIS — E039 Hypothyroidism, unspecified: Secondary | ICD-10-CM | POA: Diagnosis not present

## 2018-06-11 DIAGNOSIS — W19XXXA Unspecified fall, initial encounter: Secondary | ICD-10-CM | POA: Diagnosis present

## 2018-06-11 DIAGNOSIS — S13131S Dislocation of C2/C3 cervical vertebrae, sequela: Secondary | ICD-10-CM | POA: Diagnosis not present

## 2018-06-11 DIAGNOSIS — M79672 Pain in left foot: Secondary | ICD-10-CM | POA: Diagnosis not present

## 2018-06-11 DIAGNOSIS — I5030 Unspecified diastolic (congestive) heart failure: Secondary | ICD-10-CM | POA: Diagnosis not present

## 2018-06-11 DIAGNOSIS — D72829 Elevated white blood cell count, unspecified: Secondary | ICD-10-CM | POA: Diagnosis not present

## 2018-06-11 DIAGNOSIS — F419 Anxiety disorder, unspecified: Secondary | ICD-10-CM | POA: Diagnosis not present

## 2018-06-11 DIAGNOSIS — Q909 Down syndrome, unspecified: Secondary | ICD-10-CM

## 2018-06-11 DIAGNOSIS — W19XXXS Unspecified fall, sequela: Secondary | ICD-10-CM | POA: Diagnosis not present

## 2018-06-11 DIAGNOSIS — G300 Alzheimer's disease with early onset: Secondary | ICD-10-CM | POA: Diagnosis not present

## 2018-06-11 DIAGNOSIS — M6281 Muscle weakness (generalized): Secondary | ICD-10-CM | POA: Diagnosis not present

## 2018-06-11 DIAGNOSIS — H9190 Unspecified hearing loss, unspecified ear: Secondary | ICD-10-CM | POA: Diagnosis not present

## 2018-06-11 DIAGNOSIS — S14102D Unspecified injury at C2 level of cervical spinal cord, subsequent encounter: Secondary | ICD-10-CM | POA: Diagnosis not present

## 2018-06-11 DIAGNOSIS — Z7401 Bed confinement status: Secondary | ICD-10-CM | POA: Diagnosis not present

## 2018-06-11 DIAGNOSIS — L03116 Cellulitis of left lower limb: Secondary | ICD-10-CM | POA: Diagnosis not present

## 2018-06-11 DIAGNOSIS — S14103A Unspecified injury at C3 level of cervical spinal cord, initial encounter: Secondary | ICD-10-CM | POA: Diagnosis not present

## 2018-06-11 DIAGNOSIS — R740 Nonspecific elevation of levels of transaminase and lactic acid dehydrogenase [LDH]: Secondary | ICD-10-CM | POA: Diagnosis not present

## 2018-06-11 DIAGNOSIS — G309 Alzheimer's disease, unspecified: Secondary | ICD-10-CM | POA: Diagnosis present

## 2018-06-11 DIAGNOSIS — R2681 Unsteadiness on feet: Secondary | ICD-10-CM | POA: Diagnosis not present

## 2018-06-11 DIAGNOSIS — R5383 Other fatigue: Secondary | ICD-10-CM | POA: Diagnosis not present

## 2018-06-11 DIAGNOSIS — X509XXA Other and unspecified overexertion or strenuous movements or postures, initial encounter: Secondary | ICD-10-CM

## 2018-06-11 DIAGNOSIS — Z9181 History of falling: Secondary | ICD-10-CM | POA: Diagnosis not present

## 2018-06-11 DIAGNOSIS — S90822D Blister (nonthermal), left foot, subsequent encounter: Secondary | ICD-10-CM | POA: Diagnosis not present

## 2018-06-11 DIAGNOSIS — F0390 Unspecified dementia without behavioral disturbance: Secondary | ICD-10-CM | POA: Diagnosis not present

## 2018-06-11 DIAGNOSIS — R011 Cardiac murmur, unspecified: Secondary | ICD-10-CM

## 2018-06-11 HISTORY — DX: Down syndrome, unspecified: Q90.9

## 2018-06-11 HISTORY — DX: Allergy, unspecified, initial encounter: T78.40XA

## 2018-06-11 HISTORY — DX: Varicella without complication: B01.9

## 2018-06-11 HISTORY — DX: Cardiac murmur, unspecified: R01.1

## 2018-06-11 LAB — URINALYSIS, ROUTINE W REFLEX MICROSCOPIC
Bilirubin Urine: NEGATIVE
GLUCOSE, UA: NEGATIVE mg/dL
Hgb urine dipstick: NEGATIVE
Ketones, ur: NEGATIVE mg/dL
LEUKOCYTES UA: NEGATIVE
NITRITE: NEGATIVE
PH: 8 (ref 5.0–8.0)
Protein, ur: NEGATIVE mg/dL
Specific Gravity, Urine: 1.014 (ref 1.005–1.030)

## 2018-06-11 LAB — COMPREHENSIVE METABOLIC PANEL
ALK PHOS: 53 U/L (ref 38–126)
ALT: 27 U/L (ref 0–44)
AST: 30 U/L (ref 15–41)
Albumin: 3.6 g/dL (ref 3.5–5.0)
Anion gap: 8 (ref 5–15)
BILIRUBIN TOTAL: 0.7 mg/dL (ref 0.3–1.2)
BUN: 19 mg/dL (ref 6–20)
CHLORIDE: 102 mmol/L (ref 98–111)
CO2: 27 mmol/L (ref 22–32)
CREATININE: 0.87 mg/dL (ref 0.44–1.00)
Calcium: 9 mg/dL (ref 8.9–10.3)
GLUCOSE: 117 mg/dL — AB (ref 70–99)
Potassium: 4.4 mmol/L (ref 3.5–5.1)
Sodium: 137 mmol/L (ref 135–145)
Total Protein: 6.7 g/dL (ref 6.5–8.1)

## 2018-06-11 LAB — CBC
HEMATOCRIT: 42.5 % (ref 36.0–46.0)
Hemoglobin: 14.7 g/dL (ref 12.0–15.0)
MCH: 33.3 pg (ref 26.0–34.0)
MCHC: 34.6 g/dL (ref 30.0–36.0)
MCV: 96.4 fL (ref 78.0–100.0)
PLATELETS: 171 10*3/uL (ref 150–400)
RBC: 4.41 MIL/uL (ref 3.87–5.11)
RDW: 14.5 % (ref 11.5–15.5)
WBC: 7.4 10*3/uL (ref 4.0–10.5)

## 2018-06-11 LAB — I-STAT TROPONIN, ED
TROPONIN I, POC: 0.02 ng/mL (ref 0.00–0.08)
Troponin i, poc: 0.01 ng/mL (ref 0.00–0.08)

## 2018-06-11 LAB — MAGNESIUM: MAGNESIUM: 2 mg/dL (ref 1.7–2.4)

## 2018-06-11 LAB — I-STAT BETA HCG BLOOD, ED (MC, WL, AP ONLY): I-stat hCG, quantitative: <5 m[IU]/mL (ref <5)

## 2018-06-11 MED ORDER — LORAZEPAM 2 MG/ML IJ SOLN
1.0000 mg | Freq: Once | INTRAMUSCULAR | Status: AC
  Administered 2018-06-11: 1 mg via INTRAVENOUS
  Filled 2018-06-11: qty 1

## 2018-06-11 MED ORDER — VITAMIN D 1000 UNITS PO TABS
2000.0000 [IU] | ORAL_TABLET | Freq: Every day | ORAL | Status: DC
  Administered 2018-06-13 – 2018-06-19 (×7): 2000 [IU] via ORAL
  Filled 2018-06-11 (×7): qty 2

## 2018-06-11 MED ORDER — CALCIUM CARBONATE-VITAMIN D 500-200 MG-UNIT PO TABS
2.0000 | ORAL_TABLET | Freq: Every day | ORAL | Status: DC
  Administered 2018-06-13 – 2018-06-19 (×7): 2 via ORAL
  Filled 2018-06-11 (×7): qty 2

## 2018-06-11 MED ORDER — SODIUM CHLORIDE 0.9 % IV SOLN
INTRAVENOUS | Status: DC
  Administered 2018-06-11 – 2018-06-13 (×4): via INTRAVENOUS

## 2018-06-11 MED ORDER — ONDANSETRON HCL 4 MG/2ML IJ SOLN: 4.0000 mg | Freq: Four times a day (QID) | INTRAMUSCULAR | Status: DC | PRN

## 2018-06-11 MED ORDER — IBUPROFEN 200 MG PO TABS
600.0000 mg | ORAL_TABLET | Freq: Once | ORAL | Status: AC
  Administered 2018-06-12: 600 mg via ORAL
  Filled 2018-06-11 (×3): qty 3

## 2018-06-11 MED ORDER — ACETAMINOPHEN 325 MG PO TABS
650.0000 mg | ORAL_TABLET | Freq: Once | ORAL | Status: DC
  Filled 2018-06-11 (×2): qty 2

## 2018-06-11 MED ORDER — LEVOTHYROXINE SODIUM 25 MCG PO TABS
25.0000 ug | ORAL_TABLET | Freq: Every day | ORAL | Status: DC
  Administered 2018-06-13 – 2018-06-19 (×7): 25 ug via ORAL
  Filled 2018-06-11 (×7): qty 1

## 2018-06-11 MED ORDER — ACETAMINOPHEN 650 MG RE SUPP: 650.0000 mg | Freq: Four times a day (QID) | RECTAL | Status: DC | PRN

## 2018-06-11 MED ORDER — MORPHINE SULFATE (PF) 4 MG/ML IV SOLN
4.0000 mg | Freq: Once | INTRAVENOUS | Status: AC
  Administered 2018-06-11: 4 mg via INTRAVENOUS
  Filled 2018-06-11: qty 1

## 2018-06-11 MED ORDER — DEXAMETHASONE SODIUM PHOSPHATE 4 MG/ML IJ SOLN
4.0000 mg | Freq: Three times a day (TID) | INTRAMUSCULAR | Status: DC
  Administered 2018-06-11: 4 mg via INTRAVENOUS
  Filled 2018-06-11: qty 1

## 2018-06-11 MED ORDER — ONDANSETRON HCL 4 MG PO TABS: 4.0000 mg | ORAL_TABLET | Freq: Four times a day (QID) | ORAL | Status: DC | PRN

## 2018-06-11 MED ORDER — ACETAMINOPHEN 325 MG PO TABS
650.0000 mg | ORAL_TABLET | Freq: Four times a day (QID) | ORAL | Status: DC | PRN
  Administered 2018-06-12 – 2018-06-17 (×4): 650 mg via ORAL
  Filled 2018-06-11 (×3): qty 2

## 2018-06-11 NOTE — ED Provider Notes (Signed)
[facility]-EMERGENCY DEPT Provider Note   CSN: [id number] Arrival date & time: 06/11/18  1043     History   Chief Complaint Chief Complaint  Patient presents with  . Altered Mental Status  . Fall  . Abrasion    HPI Jasmine Ramirez is a 52 y.o. female.  The history is provided by the patient and a caregiver. No language interpreter was used.  Altered Mental Status    Fall    Jasmine Ramirez is a 52 y.o. female who presents to the Emergency Department complaining of weakness.  Level V caveat due to altered mental status. History is provided by a group home care taker. She presents to the emergency department for altered mental status with difficulty holding things in her hands. They state that she was found in the bathroom today with stool around her. When they went to clean her they noticed that she had an abrasion to her forehead. No reports of falls but she has difficulty with communication. She has been complaining of pain to both of her shoulders to staff. Staff note that she has been having difficulty holding a cup in her left hand or trying to eat or drink due to frequent dropping of objects. No vomiting, diarrhea, chest pain, abdominal pain. She has no medical problems other than Down syndrome. Past Medical History:  Diagnosis Date  . Allergy   . Chicken pox   . Down syndrome   . Heart murmur     Patient Active Problem List   Diagnosis Date Noted  . Well adult exam 10/09/2015  . Abnormal TSH 10/09/2015  . Rash and nonspecific skin eruption 10/09/2015  . Depression 02/27/2014  . Down syndrome 12/14/2013    Past Surgical History:  Procedure Laterality Date  . KNEE SURGERY  2013     OB History    Gravida  0   Para  0   Term  0   Preterm  0   AB  0   Living  0     SAB  0   TAB  0   Ectopic  0   Multiple  0   Live Births               Home Medications    Prior to Admission medications   Medication Sig Start  Date End Date Taking? Authorizing Provider  Calcium Carbonate-Vitamin D (CALCIUM 500/D PO) Take 500 mg by mouth daily.   Yes [provider]  Cholecalciferol (VITAMIN D3) 2000 units TABS Take 2,000 Units by mouth daily.   Yes [provider]  levothyroxine (SYNTHROID, LEVOTHROID) 25 MCG tablet Take 25 mcg by mouth daily before breakfast.   Yes [provider]  Melatonin 3 MG TABS Take 3 mg by mouth at bedtime.   Yes [provider]  Multiple Vitamins-Minerals (MULTIVITAMIN PO) Take by mouth.   Yes [provider]  Cyanocobalamin (VITAMIN B-12) 500 MCG SUBL Use 1 tab SL qd Patient not taking: Reported on 06/11/2018 10/09/15   Plotnikov, Georgina Quint, MD  triamcinolone ointment (KENALOG) 0.5 % Apply 1 application topically 4 (four) times daily. To lips Patient not taking: Reported on 06/11/2018 10/09/15   Plotnikov, Georgina Quint, MD    Family History Family History  Problem Relation Age of Onset  . Cancer Father        Prostate  . Diabetes Father   . Cancer Maternal Grandmother        Breast  . Cancer Paternal  Grandfather        Colon    Social History Social History   Tobacco Use  . Smoking status: Never Smoker  Substance Use Topics  . Alcohol use: Not on file  . Drug use: Not on file     Allergies   Patient has no known allergies.   Review of Systems Review of Systems  All other systems reviewed and are negative.    Physical Exam Updated Vital Signs BP (!) 135/110   Pulse (!) 51   Temp 98.5 F (36.9 C) (Oral)   Resp 17   SpO2 100%   Physical Exam  Constitutional: She is oriented to person, place, and time. She appears well-developed and well-nourished.  HENT:  Head: Normocephalic.  Ecchymosis to forehead  Cardiovascular: Normal rate and regular rhythm.  No murmur heard. Pulmonary/Chest: Effort normal and breath sounds normal. No respiratory distress.  Abdominal: Soft. There is no tenderness. There is no rebound and no  guarding.  Musculoskeletal: She exhibits no edema or tenderness.  2+ radial pulses bilaterally. No consistently reproducible tenderness to palpation on the arms or back.  Neurological: She is alert and oriented to person, place, and time.  Confused. Developmental delay. 3+ to 4/5 strength and bilateral upper extremities, weakness in the proximal upper extremities. Five out of five strength in bilateral lower extremities. Ataxic gait.  Skin: Skin is warm and dry.  Psychiatric: She has a normal mood and affect. Her behavior is normal.  Nursing note and vitals reviewed.    ED Treatments / Results  Labs (all labs ordered are listed, but only abnormal results are displayed) Labs Reviewed  COMPREHENSIVE METABOLIC PANEL - Abnormal; Notable for the following components:      Result Value   Glucose, Bld 117 (*)    All other components within normal limits  CBC  URINALYSIS, ROUTINE W REFLEX MICROSCOPIC  CBG MONITORING, ED  I-STAT BETA HCG BLOOD, ED ([facility], [facility], [facility] ONLY)  I-STAT TROPONIN, ED  I-STAT TROPONIN, ED    EKG EKG Interpretation  Date/Time:  Friday June 11 2018 12:43:01 EDT Ventricular Rate:  52 PR Interval:    QRS Duration: 104 QT Interval:  456 QTC Calculation: 425 R Axis:   99 Text Interpretation:  Right and left arm electrode reversal, interpretation assumes no reversal Sinus rhythm Atrial premature complex Borderline right axis deviation no prior available for comparison Confirmed by Tilden Fossa [phone]) on 06/11/2018 12:49:38 PM   Radiology Dg Chest 2 View  Result Date: 06/11/2018 CLINICAL DATA:  Altered mental status.  Recent fall. EXAM: CHEST - 2 VIEW COMPARISON:  None. FINDINGS: There is mild left base atelectasis. No edema or consolidation. Heart size and pulmonary vascularity are normal. No adenopathy. No pneumothorax. There is a bone island in the right proximal humerus. There is a probable bone infarct in the proximal left humerus. IMPRESSION: Slight atelectasis  left base. No edema or consolidation. Heart size within normal limits. No pneumothorax evident. Electronically Signed   By: Bretta Bang III M.D.   On: 06/11/2018 15:05   Ct Head Wo Contrast  Result Date: 06/11/2018 CLINICAL DATA:  Altered mental status.  Fall this morning. EXAM: CT HEAD WITHOUT CONTRAST CT CERVICAL SPINE WITHOUT CONTRAST TECHNIQUE: Multidetector CT imaging of the head and cervical spine was performed following the standard protocol without intravenous contrast. Multiplanar CT image reconstructions of the cervical spine were also generated. COMPARISON:  None. FINDINGS: Examination is limited by motion artifact. CT HEAD FINDINGS Brain: No evidence of  acute infarction, hemorrhage, hydrocephalus, extra-axial collection or mass lesion/mass effect. Mild generalized cerebral atrophy, advanced for age. Vascular: No hyperdense vessel or unexpected calcification. Skull: Normal. Negative for fracture or focal lesion. Sinuses/Orbits: No acute finding.  Underdeveloped mastoid air cells. Other: Small right frontal scalp hematoma. CT CERVICAL SPINE FINDINGS Alignment: Degenerative trace retrolisthesis at C2-C3 and trace anterolisthesis at C4-C5. No traumatic malalignment. Skull base and vertebrae: No acute fracture. No primary bone lesion or focal pathologic process. Soft tissues and spinal canal: No prevertebral fluid or swelling. No visible canal hematoma. Disc levels: Moderate degenerative disc disease and uncovertebral hypertrophy at C2-C3 and from C5-C6 through C7-T1. Moderate left-sided facet arthropathy throughout the cervical spine. Upper chest: Negative. Other: None. IMPRESSION: 1. Motion limited exam. No definite acute intracranial abnormality. Small right frontal scalp hematoma. 2. No definite acute cervical spine fracture. Moderate degenerative changes of the cervical spine. Electronically Signed   By: Obie Dredge M.D.   On: 06/11/2018 12:42   Ct Cervical Spine Wo Contrast  Result  Date: 06/11/2018 CLINICAL DATA:  Altered mental status.  Fall this morning. EXAM: CT HEAD WITHOUT CONTRAST CT CERVICAL SPINE WITHOUT CONTRAST TECHNIQUE: Multidetector CT imaging of the head and cervical spine was performed following the standard protocol without intravenous contrast. Multiplanar CT image reconstructions of the cervical spine were also generated. COMPARISON:  None. FINDINGS: Examination is limited by motion artifact. CT HEAD FINDINGS Brain: No evidence of acute infarction, hemorrhage, hydrocephalus, extra-axial collection or mass lesion/mass effect. Mild generalized cerebral atrophy, advanced for age. Vascular: No hyperdense vessel or unexpected calcification. Skull: Normal. Negative for fracture or focal lesion. Sinuses/Orbits: No acute finding.  Underdeveloped mastoid air cells. Other: Small right frontal scalp hematoma. CT CERVICAL SPINE FINDINGS Alignment: Degenerative trace retrolisthesis at C2-C3 and trace anterolisthesis at C4-C5. No traumatic malalignment. Skull base and vertebrae: No acute fracture. No primary bone lesion or focal pathologic process. Soft tissues and spinal canal: No prevertebral fluid or swelling. No visible canal hematoma. Disc levels: Moderate degenerative disc disease and uncovertebral hypertrophy at C2-C3 and from C5-C6 through C7-T1. Moderate left-sided facet arthropathy throughout the cervical spine. Upper chest: Negative. Other: None. IMPRESSION: 1. Motion limited exam. No definite acute intracranial abnormality. Small right frontal scalp hematoma. 2. No definite acute cervical spine fracture. Moderate degenerative changes of the cervical spine. Electronically Signed   By: Obie Dredge M.D.   On: 06/11/2018 12:42    Procedures Procedures (including critical care time)  Medications Ordered in ED Medications  acetaminophen (TYLENOL) tablet 650 mg (0 mg Oral Hold 06/11/18 1646)  ibuprofen (ADVIL,MOTRIN) tablet 600 mg (0 mg Oral Hold 06/11/18 1647)  LORazepam  (ATIVAN) injection 1 mg (1 mg Intravenous Given 06/11/18 1744)     Initial Impression / Assessment and Plan / ED Course  I have reviewed the triage vital signs and the nursing notes.  Pertinent labs & imaging results that were available during my care of the patient were reviewed by me and considered in my medical decision making (see chart for details).     Patient here for evaluation of injuries following a fall, change in her mental status. Developmental delay limits examination as it is difficult to ascertain points of tenderness and she has difficulty following commands. She does appear to be weak, greatest in her proximal upper extremities. She does not want to put forth effort for strength testing and does not want to try to reach for objects. CT head and neck are negative for acute injuries, concern for possible  occult C-spine injury versus CVA. Plan to obtain MRI to further evaluate. Patient care transferred pending imaging.  Final Clinical Impressions(s) / ED Diagnoses   Final diagnoses:  None    ED Discharge Orders    None       Tilden Fossaees, Keaton Stirewalt, MD 06/11/18 1758

## 2018-06-11 NOTE — H&P (Signed)
History and Physical    [name] ZOX:[id number] DOB: 1966-10-17 DOA: 06/11/2018  PCP: Lucretia Field  Patient coming from: SNF / Group home  I have personally briefly reviewed patient's old medical records in [facility]  Chief Complaint: Weakness, fall  HPI: Jasmine Ramirez is a 52 y.o. female with medical history significant of Down syndrome, dementia.  Today patient seems to have had unwitnessed fall at facility.  Likely struck front of forehead (based on the early bruising to forehead).  Since that time she has had to have 2 person assist with walking (normally ambulates unassisted without walker), inability to feed self with BUE, apparent weakness in BUE dropping things, etc.   ED Course: CT head and neck neg for acute fx or finding.  MRI head showed nothing acute on the parts of it they could get, but patient couldn't tolerate any further in the scan.  MRI neck couldn't be completed.   Review of Systems: As per HPI otherwise 10 point review of systems negative.   Past Medical History:  Diagnosis Date  . Allergy   . Chicken pox   . Down syndrome   . Heart murmur     Past Surgical History:  Procedure Laterality Date  . KNEE SURGERY  2013     reports that she has never smoked. She does not have any smokeless tobacco history on file. Her alcohol and drug histories are not on file.  No Known Allergies  Family History  Problem Relation Age of Onset  . Cancer Father        Prostate  . Diabetes Father   . Cancer Maternal Grandmother        Breast  . Cancer Paternal Grandfather        Colon     Prior to Admission medications   Medication Sig Start Date End Date Taking? Authorizing Provider  Calcium Carbonate-Vitamin D (CALCIUM 500/D PO) Take 500 mg by mouth daily.   Yes [provider]  Cholecalciferol (VITAMIN D3) 2000 units TABS Take 2,000 Units by mouth daily.   Yes [provider]  levothyroxine (SYNTHROID, LEVOTHROID) 25  MCG tablet Take 25 mcg by mouth daily before breakfast.   Yes [provider]  Melatonin 3 MG TABS Take 3 mg by mouth at bedtime.   Yes [provider]  Multiple Vitamins-Minerals (MULTIVITAMIN PO) Take by mouth.   Yes [provider]    Physical Exam: Vitals:   06/11/18 1315 06/11/18 1418 06/11/18 1907 06/11/18 2026  BP:  (!) 135/110 112/78 123/65  Pulse: 60 (!) 51 (!) 58 62  Resp: 16 17 15    Temp:      TempSrc:      SpO2: 100% 100% 100% 97%    Constitutional: NAD, calm, comfortable Eyes: PERRL, lids and conjunctivae normal ENMT: Mucous membranes are moist. Posterior pharynx clear of any exudate or lesions.Normal dentition.  Neck: In aspen collar, TTP posterior Respiratory: clear to auscultation bilaterally, no wheezing, no crackles. Normal respiratory effort. No accessory muscle use.  Cardiovascular: Regular rate and rhythm, no murmurs / rubs / gallops. No extremity edema. 2+ pedal pulses. No carotid bruits.  Abdomen: no tenderness, no masses palpated. No hepatosplenomegaly. Bowel sounds positive.  Musculoskeletal: no clubbing / cyanosis. No joint deformity upper and lower extremities. Good ROM, no contractures. Normal muscle tone.  Skin: Bruising to forehead Neurologic: Weakness in BUE, BLE.  Difficult exam though as patient only intermittently obeys commands. Psychiatric: Minimally verbal, intermittently obeying commands, not  really able to answer questions.  However, it seems that all this is apparently baseline per care-giver   Labs on Admission: I have personally reviewed following labs and imaging studies  CBC: Recent Labs  Lab 06/11/18 1139  WBC 7.4  HGB 14.7  HCT 42.5  MCV 96.4  PLT 171   Basic Metabolic Panel: Recent Labs  Lab 06/11/18 1139  NA 137  K 4.4  CL 102  CO2 27  GLUCOSE 117*  BUN 19  CREATININE 0.87  CALCIUM 9.0   GFR: CrCl cannot be calculated (Unknown ideal weight.). Liver Function Tests: Recent Labs  Lab  06/11/18 1139  AST 30  ALT 27  ALKPHOS 53  BILITOT 0.7  PROT 6.7  ALBUMIN 3.6   No results for input(s): LIPASE, AMYLASE in the last 168 hours. No results for input(s): AMMONIA in the last 168 hours. Coagulation Profile: No results for input(s): INR, PROTIME in the last 168 hours. Cardiac Enzymes: No results for input(s): CKTOTAL, CKMB, CKMBINDEX, TROPONINI in the last 168 hours. BNP (last 3 results) No results for input(s): PROBNP in the last 8760 hours. HbA1C: No results for input(s): HGBA1C in the last 72 hours. CBG: No results for input(s): GLUCAP in the last 168 hours. Lipid Profile: No results for input(s): CHOL, HDL, LDLCALC, TRIG, CHOLHDL, LDLDIRECT in the last 72 hours. Thyroid Function Tests: No results for input(s): TSH, T4TOTAL, FREET4, T3FREE, THYROIDAB in the last 72 hours. Anemia Panel: No results for input(s): VITAMINB12, FOLATE, FERRITIN, TIBC, IRON, RETICCTPCT in the last 72 hours. Urine analysis:    Component Value Date/Time   COLORURINE YELLOW 06/11/2018 1644   APPEARANCEUR CLEAR 06/11/2018 1644   LABSPEC 1.014 06/11/2018 1644   PHURINE 8.0 06/11/2018 1644   GLUCOSEU NEGATIVE 06/11/2018 1644   HGBUR NEGATIVE 06/11/2018 1644   BILIRUBINUR NEGATIVE 06/11/2018 1644   KETONESUR NEGATIVE 06/11/2018 1644   PROTEINUR NEGATIVE 06/11/2018 1644   NITRITE NEGATIVE 06/11/2018 1644   LEUKOCYTESUR NEGATIVE 06/11/2018 1644    Radiological Exams on Admission: Dg Chest 2 View  Result Date: 06/11/2018 CLINICAL DATA:  Altered mental status.  Recent fall. EXAM: CHEST - 2 VIEW COMPARISON:  None. FINDINGS: There is mild left base atelectasis. No edema or consolidation. Heart size and pulmonary vascularity are normal. No adenopathy. No pneumothorax. There is a bone island in the right proximal humerus. There is a probable bone infarct in the proximal left humerus. IMPRESSION: Slight atelectasis left base. No edema or consolidation. Heart size within normal limits. No  pneumothorax evident. Electronically Signed   By: Bretta Bang III M.D.   On: 06/11/2018 15:05   Ct Head Wo Contrast  Result Date: 06/11/2018 CLINICAL DATA:  Altered mental status.  Fall this morning. EXAM: CT HEAD WITHOUT CONTRAST CT CERVICAL SPINE WITHOUT CONTRAST TECHNIQUE: Multidetector CT imaging of the head and cervical spine was performed following the standard protocol without intravenous contrast. Multiplanar CT image reconstructions of the cervical spine were also generated. COMPARISON:  None. FINDINGS: Examination is limited by motion artifact. CT HEAD FINDINGS Brain: No evidence of acute infarction, hemorrhage, hydrocephalus, extra-axial collection or mass lesion/mass effect. Mild generalized cerebral atrophy, advanced for age. Vascular: No hyperdense vessel or unexpected calcification. Skull: Normal. Negative for fracture or focal lesion. Sinuses/Orbits: No acute finding.  Underdeveloped mastoid air cells. Other: Small right frontal scalp hematoma. CT CERVICAL SPINE FINDINGS Alignment: Degenerative trace retrolisthesis at C2-C3 and trace anterolisthesis at C4-C5. No traumatic malalignment. Skull base and vertebrae: No acute fracture. No primary bone lesion  or focal pathologic process. Soft tissues and spinal canal: No prevertebral fluid or swelling. No visible canal hematoma. Disc levels: Moderate degenerative disc disease and uncovertebral hypertrophy at C2-C3 and from C5-C6 through C7-T1. Moderate left-sided facet arthropathy throughout the cervical spine. Upper chest: Negative. Other: None. IMPRESSION: 1. Motion limited exam. No definite acute intracranial abnormality. Small right frontal scalp hematoma. 2. No definite acute cervical spine fracture. Moderate degenerative changes of the cervical spine. Electronically Signed   By: Obie Dredge M.D.   On: 06/11/2018 12:42   Ct Cervical Spine Wo Contrast  Result Date: 06/11/2018 CLINICAL DATA:  Altered mental status.  Fall this morning.  EXAM: CT HEAD WITHOUT CONTRAST CT CERVICAL SPINE WITHOUT CONTRAST TECHNIQUE: Multidetector CT imaging of the head and cervical spine was performed following the standard protocol without intravenous contrast. Multiplanar CT image reconstructions of the cervical spine were also generated. COMPARISON:  None. FINDINGS: Examination is limited by motion artifact. CT HEAD FINDINGS Brain: No evidence of acute infarction, hemorrhage, hydrocephalus, extra-axial collection or mass lesion/mass effect. Mild generalized cerebral atrophy, advanced for age. Vascular: No hyperdense vessel or unexpected calcification. Skull: Normal. Negative for fracture or focal lesion. Sinuses/Orbits: No acute finding.  Underdeveloped mastoid air cells. Other: Small right frontal scalp hematoma. CT CERVICAL SPINE FINDINGS Alignment: Degenerative trace retrolisthesis at C2-C3 and trace anterolisthesis at C4-C5. No traumatic malalignment. Skull base and vertebrae: No acute fracture. No primary bone lesion or focal pathologic process. Soft tissues and spinal canal: No prevertebral fluid or swelling. No visible canal hematoma. Disc levels: Moderate degenerative disc disease and uncovertebral hypertrophy at C2-C3 and from C5-C6 through C7-T1. Moderate left-sided facet arthropathy throughout the cervical spine. Upper chest: Negative. Other: None. IMPRESSION: 1. Motion limited exam. No definite acute intracranial abnormality. Small right frontal scalp hematoma. 2. No definite acute cervical spine fracture. Moderate degenerative changes of the cervical spine. Electronically Signed   By: Obie Dredge M.D.   On: 06/11/2018 12:42   Mr Brain Wo Contrast  Result Date: 06/11/2018 CLINICAL DATA:  Initial evaluation for neuro decline, head trauma. EXAM: MRI HEAD WITHOUT CONTRAST TECHNIQUE: Multiplanar, multiecho pulse sequences of the brain and surrounding structures were obtained without intravenous contrast. COMPARISON:  Prior CT from earlier the same  day. FINDINGS: Brain: Examination technically limited as the patient was unable to tolerate the full length of the exam. Axial DWI and T2 weighted sequences, with sagittal T1 weighted sequence were performed. Additionally, images provided are degraded by motion. Diffusion-weighted imaging demonstrates no evidence for acute or subacute ischemia. Cerebral volume grossly within normal limits for age. No definite encephalomalacia to suggest chronic infarction. No appreciable mass lesion. No midline shift or mass effect. No hydrocephalus. No extra-axial fluid collection. Vascular: Major intracranial vascular flow voids are grossly maintained at the skull base. Skull and upper cervical spine: Craniocervical junction demonstrates no acute finding. Degree of basilar invagination related to down syndrome suspected. Bone marrow signal intensity within normal limits. No scalp soft tissue abnormality. Sinuses/Orbits: Globes and orbital soft tissues demonstrate no obvious abnormality. Paranasal sinuses grossly clear. Other: None. IMPRESSION: 1. Limited study due to patient's inability to tolerate the full length of the exam and motion artifact. 2. No definite acute intracranial abnormality. Electronically Signed   By: Rise Mu M.D.   On: 06/11/2018 18:40    EKG: Independently reviewed.  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 Hyperextension injury of cervical spine, suspect cord injury - 1. EDP spoke with Dr. Wynetta Emeryram: 1. Decadron 4mg  Q8H 2. Get MRI 2. [facility] which will require anesthesia, suspect they wont do this till AM however. 3. Aspen collar 4. No displaced or obvious bony fx on CT scan. 5. Dr. Amada JupiterKirkpatrick evaluated patient: 1. Agrees traumatic cord injury is most  likely. 2. Recd calling NS (which was then done as above). 6. NPO after MN 7. IVF: NS at 73 2. Down's syndrome with alzheimer's - 1. Chronic and stable 2. Mental status is actually at baseline it seems per caregivers  DVT prophylaxis: SCDs Code Status: Full Family Communication: Caregiver's at bedside Disposition Plan: SNF after admit Consults called: Dr. Amada JupiterKirkpatrick evaluated patient at bedside.  EDP spoke with Dr. Wynetta Emeryram Admission status: Admit to inpatient   Hillary BowGARDNER, Maria Gallicchio M. DO [facility] Pager [phone] Only works nights!  If 7AM-7PM, please contact the primary day team physician taking care of patient  [email or web address] Password [email or web address]  06/11/2018, 9:07 PM

## 2018-06-11 NOTE — ED Provider Notes (Signed)
Assumed care at change of shift with MRIs pending.  Unfortunately, patient was unable to tolerate MRI. Only able to obtain some motion degraded images of brain. Clinically she has a cord injury. She will be need to be admitted for imaging under sedation.  She was placed in Aspen collar.  She admitted to the hospitalist service. Daughter and caretaker report that she does appear to be in pain and she does seem tender on palpation of cervical spine. Some pain meds were ordered. Will discuss with neurosurgery if they have any further recommendations pending MRI.  Discussed with neurosurgery. 4mg  decadron q8h. Not much more to add at this point without imaging.   CRITICAL CARE Performed by: Raeford RazorStephen Taite Baldassari Total critical care time: 40 minutes Critical care time was exclusive of separately billable procedures and treating other patients. Critical care was necessary to treat or prevent imminent or life-threatening deterioration. Trauma. Neck pain with neurological deficits. Clinically spinal cord injury. Critical care was time spent personally by me on the following activities: development of treatment plan with patient and/or surrogate as well as nursing, discussions with consultants, evaluation of patient's response to treatment, examination of patient, obtaining history from patient or surrogate, ordering and performing treatments and interventions, ordering and review of laboratory studies, ordering and review of radiographic studies, pulse oximetry and re-evaluation of patient's condition.    Raeford RazorKohut, Shukri Nistler, MD 06/13/18 2004

## 2018-06-11 NOTE — ED Triage Notes (Signed)
Patient here from group home with complaints of altered mental status. More increased than normal. Unable to feed self and hold a cup. Fell this morning abrasion to face.

## 2018-06-11 NOTE — ED Notes (Signed)
Patient transported to MRI 

## 2018-06-11 NOTE — Consult Note (Signed)
Neurology Consultation Reason for Consult: Weakness Referring Physician: Juleen ChinaKohut, S  CC: Weakness  History is obtained from: Patient's caregiver  HPI: Jasmine Ramirez is a 52 y.o. female with a history of Down syndrome and memory problems who presents with weakness after a fall today. The timing of the fall is unclear, the patient was sitting on the toilet, then another caregiver came in the room and found her still sitting on the toilet but in the interim had developed a scrape on her for head indicating injury there.  At baseline, she is not able to give much history regarding events like this. She is, however able to feed herself and was noted that she was not doing as well which prompted evaluation in the emergency department.   ROS:  Unable to obtain due to altered mental status.   Past Medical History:  Diagnosis Date  . Allergy   . Chicken pox   . Down syndrome   . Heart murmur      Family History  Problem Relation Age of Onset  . Cancer Father        Prostate  . Diabetes Father   . Cancer Maternal Grandmother        Breast  . Cancer Paternal Grandfather        Colon     Social History:  reports that she has never smoked. She does not have any smokeless tobacco history on file. Her alcohol and drug histories are not on file.   Exam: Current vital signs: BP 123/65 (BP Location: Left Arm)   Pulse 62   Temp 98.5 F (36.9 C) (Oral)   Resp 15   SpO2 97%  Vital signs in last 24 hours: Temp:  [98.5 F (36.9 C)] 98.5 F (36.9 C) (07/26 1107) Pulse Rate:  [50-62] 62 (07/26 2026) Resp:  [13-18] 15 (07/26 1907) BP: (103-135)/(65-110) 123/65 (07/26 2026) SpO2:  [97 %-100 %] 97 % (07/26 2026)   Physical Exam  Constitutional: Appears well-developed and well-nourished.  Psych: Affect appropriate to situation Eyes: No scleral injection HENT: No OP obstrucion Head: Normocephalic.  Cardiovascular: Normal rate and regular rhythm.  Respiratory: Effort normal,  non-labored breathing GI: Soft.  No distension. There is no tenderness.  Skin: WDI  Neuro: Mental Status: Patient is awake, alert, she is able to tell me her name, but response to other questions do not seem very reliable.(Baseline per caregiver's) Cranial Nerves: II: Blinks to threat bilaterally. Pupils are equal, round, and reactive to light.   III,IV, VI: EOMI without ptosis or diploplia.  V: Facial sensation is symmetric to temperature VII: Facial movement is symmetric.  VIII: hearing is intact to voice X: Uvula elevates symmetrically XI: Shoulder shrug is symmetric. XII: tongue is midline without atrophy or fasciculations.  Motor: She doe snot cooperate with formal testing. I do see her lift her right arm against gravity reaching for her collar, however I do not see her lift her left arm or her left leg against gravity, but there is some movement in both Sensory: She responds to noxious stimulation in all 4 extremities Deep Tendon Reflexes: 3+ and symmetric in the biceps and patellae.  Plantars: Toes are downgoing bilaterally.  Cerebellar: She does not perform   I have reviewed labs in epic and the results pertinent to this consultation are: CMP-unremarkable  I have reviewed the images obtained: CT C-spine-no bony injury  Impression: 52 year old female with Down syndrome and likely Alzheimer's who presents with weakness following fall. I suspect she  had a hyperextension injury to her cervical spine given the negative MRI of her brain.  Recommendations: 1) MRI C-spine 2) neurology will be available if MRI of her cervical spine does not reveal an etiology for her weakness.   Ritta Slot, MD [facility] [phone]  If 7pm- 7am, please page neurology on call as listed in AMION.

## 2018-06-12 ENCOUNTER — Inpatient Hospital Stay (HOSPITAL_COMMUNITY): Payer: Medicare Other | Admitting: Certified Registered Nurse Anesthetist

## 2018-06-12 ENCOUNTER — Encounter (HOSPITAL_COMMUNITY): Payer: Self-pay

## 2018-06-12 ENCOUNTER — Other Ambulatory Visit: Payer: Self-pay

## 2018-06-12 ENCOUNTER — Encounter (HOSPITAL_COMMUNITY)
Admission: EM | Disposition: A | Discharge status: Skilled Nursing Facility | Payer: Self-pay | Source: Home / Self Care | Attending: Internal Medicine

## 2018-06-12 ENCOUNTER — Inpatient Hospital Stay (HOSPITAL_COMMUNITY): Payer: Medicare Other

## 2018-06-12 DIAGNOSIS — S14109A Unspecified injury at unspecified level of cervical spinal cord, initial encounter: Secondary | ICD-10-CM

## 2018-06-12 HISTORY — PX: RADIOLOGY WITH ANESTHESIA: SHX6223

## 2018-06-12 SURGERY — MRI WITH ANESTHESIA
Anesthesia: General

## 2018-06-12 MED ORDER — LACTATED RINGERS IV SOLN: INTRAVENOUS | Status: DC

## 2018-06-12 MED ORDER — DEXAMETHASONE SODIUM PHOSPHATE 4 MG/ML IJ SOLN
6.0000 mg | Freq: Three times a day (TID) | INTRAMUSCULAR | Status: DC
  Administered 2018-06-12 – 2018-06-18 (×18): 6 mg via INTRAVENOUS
  Filled 2018-06-12 (×19): qty 2

## 2018-06-12 MED ORDER — MIDAZOLAM HCL 2 MG/2ML IJ SOLN
INTRAMUSCULAR | Status: AC
  Filled 2018-06-12: qty 2

## 2018-06-12 MED ORDER — DEXAMETHASONE SODIUM PHOSPHATE 4 MG/ML IJ SOLN: 6.0000 mg | Freq: Three times a day (TID) | INTRAMUSCULAR | Status: DC

## 2018-06-12 MED ORDER — FENTANYL CITRATE (PF) 250 MCG/5ML IJ SOLN
INTRAMUSCULAR | Status: AC
  Filled 2018-06-12: qty 5

## 2018-06-12 NOTE — Anesthesia Postprocedure Evaluation (Signed)
Anesthesia Post Note  Patient: Henrene DodgeJennifer Sellick  Procedure(s) Performed: MRI WITH ANESTHESIA (N/A )     Patient location during evaluation: PACU Anesthesia Type: General Level of consciousness: awake and alert Pain management: pain level controlled Vital Signs Assessment: post-procedure vital signs reviewed and stable Respiratory status: spontaneous breathing, nonlabored ventilation, respiratory function stable and patient connected to nasal cannula oxygen Cardiovascular status: blood pressure returned to baseline and stable Postop Assessment: no apparent nausea or vomiting Anesthetic complications: no    Last Vitals:  Vitals:   06/12/18 1334 06/12/18 1336  BP: (!) 98/47   Pulse: 72 (!) 55  Resp: 12 11  Temp:  (!) 36.4 C  SpO2: 96% 95%    Last Pain:  Vitals:   06/12/18 0843  TempSrc: Axillary  PainSc:                  Shelton SilvasKevin D Hollis

## 2018-06-12 NOTE — Progress Notes (Signed)
PROGRESS NOTE  Jasmine DodgeJennifer Ramirez XBJ:[id number] DOB: Feb 09, 1966 DOA: 06/11/2018 PCP: Lucretia Fieldoyals, [place] M   LOS: 1 day   Brief Narrative / Interim history: 52 year old female with history of dementia, Down syndrome, presented to the hospital with an unwitnessed fall in her group home.  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 Wynetta Emeryram and recommended Decadron and MRI of the C-spine.  Limited MRI of the brain and CT scan imaging did not show anything acute -Patient to get the MRI under general anesthesia today -Neurology aware, discussed with Dr. Laurence SlateAroor  Down syndrome with Alzheimer's -Chronic and baseline  Hypothyroidism -Continue Synthroid   DVT prophylaxis: SCDs Code Status: Full code Family Communication: Discussed with sister at bedside Disposition Plan: To be determined based on MRI findings  Consultants:   Neurology  Procedures:   None   Antimicrobials:  None    Subjective: -No complaints, not able to give any history  Objective: Vitals:   06/11/18 2252 06/12/18 0427 06/12/18 0434 06/12/18 0843  BP: (!) 116/52 (!) 86/45 (!) 92/52 115/69  Pulse: 63 (!) 43 (!) 43 (!) 45  Resp: 16 16 16 20   Temp: 98.6 F (37 C)   97.7 F (36.5 C)  TempSrc: Oral   Axillary  SpO2: 100% 100% 95% 97%  Weight:   78.3 kg (172 lb 9.9 oz)   Height: 4\' 10"  (1.473 m)       Intake/Output Summary (Last 24 hours) at 06/12/2018 1218 Last data filed at 06/12/2018 0900 Gross per 24 hour  Intake 762.5 ml  Output -  Net 762.5 ml   Filed Weights   06/12/18 0434    Weight: 78.3 kg (172 lb 9.9 oz)    Examination:  Constitutional: NAD Eyes: No scleral icterus ENMT: Mucous membranes are moist. Neck: normal, supple Respiratory: clear to auscultation bilaterally, no wheezing, no crackles. Cardiovascular: Regular rate and rhythm, no murmurs / rubs / gallops. No LE edema. Abdomen: no tenderness. Bowel sounds positive.  Skin: no rashes Neurologic: Unable to assess accurately and she intermittently follows commands    Data Reviewed: I have independently reviewed following labs and imaging studies   CT scan of the C-spine, head, MRI of the brain personally reviewed-I see no significant acute findings  CBC: Recent Labs  Lab 06/11/18 1139  WBC 7.4  HGB 14.7  HCT 42.5  MCV 96.4  PLT 171   Basic Metabolic Panel: Recent Labs  Lab 06/11/18 1139 06/11/18 2030  NA 137  --   K 4.4  --   CL 102  --   CO2 27  --   GLUCOSE 117*  --   BUN 19  --   CREATININE 0.87  --   CALCIUM 9.0  --   MG  --  2.0   GFR: Estimated Creatinine Clearance: 66 mL/min (by C-G formula based on SCr of 0.87 mg/dL). Liver Function Tests: Recent Labs  Lab 06/11/18 1139  AST 30  ALT 27  ALKPHOS 53  BILITOT 0.7  PROT 6.7  ALBUMIN 3.6   No results for input(s): LIPASE, AMYLASE in the last  168 hours. No results for input(s): AMMONIA in the last 168 hours. Coagulation Profile: No results for input(s): INR, PROTIME in the last 168 hours. Cardiac Enzymes: No results for input(s): CKTOTAL, CKMB, CKMBINDEX, TROPONINI in the last 168 hours. BNP (last 3 results) No results for input(s): PROBNP in the last 8760 hours. HbA1C: No results for input(s): HGBA1C in the last 72 hours. CBG: No results for input(s): GLUCAP in the last 168 hours. Lipid Profile: No results for input(s): CHOL, HDL, LDLCALC, TRIG, CHOLHDL, LDLDIRECT in the last 72 hours. Thyroid Function Tests: No results for input(s): TSH, T4TOTAL, FREET4, T3FREE, THYROIDAB in the last 72 hours. Anemia  Panel: No results for input(s): VITAMINB12, FOLATE, FERRITIN, TIBC, IRON, RETICCTPCT in the last 72 hours. Urine analysis:    Component Value Date/Time   COLORURINE YELLOW 06/11/2018 1644   APPEARANCEUR CLEAR 06/11/2018 1644   LABSPEC 1.014 06/11/2018 1644   PHURINE 8.0 06/11/2018 1644   GLUCOSEU NEGATIVE 06/11/2018 1644   HGBUR NEGATIVE 06/11/2018 1644   BILIRUBINUR NEGATIVE 06/11/2018 1644   KETONESUR NEGATIVE 06/11/2018 1644   PROTEINUR NEGATIVE 06/11/2018 1644   NITRITE NEGATIVE 06/11/2018 1644   LEUKOCYTESUR NEGATIVE 06/11/2018 1644   Sepsis Labs: Invalid input(s): PROCALCITONIN, LACTICIDVEN  No results found for this or any previous visit (from the past 240 hour(s)).    Radiology Studies: Dg Chest 2 View  Result Date: 06/11/2018 CLINICAL DATA:  Altered mental status.  Recent fall. EXAM: CHEST - 2 VIEW COMPARISON:  None. FINDINGS: There is mild left base atelectasis. No edema or consolidation. Heart size and pulmonary vascularity are normal. No adenopathy. No pneumothorax. There is a bone island in the right proximal humerus. There is a probable bone infarct in the proximal left humerus. IMPRESSION: Slight atelectasis left base. No edema or consolidation. Heart size within normal limits. No pneumothorax evident. Electronically Signed   By: Bretta Bang III M.D.   On: 06/11/2018 15:05   Ct Head Wo Contrast  Result Date: 06/11/2018 CLINICAL DATA:  Altered mental status.  Fall this morning. EXAM: CT HEAD WITHOUT CONTRAST CT CERVICAL SPINE WITHOUT CONTRAST TECHNIQUE: Multidetector CT imaging of the head and cervical spine was performed following the standard protocol without intravenous contrast. Multiplanar CT image reconstructions of the cervical spine were also generated. COMPARISON:  None. FINDINGS: Examination is limited by motion artifact. CT HEAD FINDINGS Brain: No evidence of acute infarction, hemorrhage, hydrocephalus, extra-axial collection or mass lesion/mass effect.  Mild generalized cerebral atrophy, advanced for age. Vascular: No hyperdense vessel or unexpected calcification. Skull: Normal. Negative for fracture or focal lesion. Sinuses/Orbits: No acute finding.  Underdeveloped mastoid air cells. Other: Small right frontal scalp hematoma. CT CERVICAL SPINE FINDINGS Alignment: Degenerative trace retrolisthesis at C2-C3 and trace anterolisthesis at C4-C5. No traumatic malalignment. Skull base and vertebrae: No acute fracture. No primary bone lesion or focal pathologic process. Soft tissues and spinal canal: No prevertebral fluid or swelling. No visible canal hematoma. Disc levels: Moderate degenerative disc disease and uncovertebral hypertrophy at C2-C3 and from C5-C6 through C7-T1. Moderate left-sided facet arthropathy throughout the cervical spine. Upper chest: Negative. Other: None. IMPRESSION: 1. Motion limited exam. No definite acute intracranial abnormality. Small right frontal scalp hematoma. 2. No definite acute cervical spine fracture. Moderate degenerative changes of the cervical spine. Electronically Signed   By: Obie Dredge M.D.   On: 06/11/2018 12:42   Ct Cervical Spine Wo Contrast  Result Date: 06/11/2018 CLINICAL DATA:  Altered mental status.  Fall this morning. EXAM: CT HEAD WITHOUT CONTRAST  CT CERVICAL SPINE WITHOUT CONTRAST TECHNIQUE: Multidetector CT imaging of the head and cervical spine was performed following the standard protocol without intravenous contrast. Multiplanar CT image reconstructions of the cervical spine were also generated. COMPARISON:  None. FINDINGS: Examination is limited by motion artifact. CT HEAD FINDINGS Brain: No evidence of acute infarction, hemorrhage, hydrocephalus, extra-axial collection or mass lesion/mass effect. Mild generalized cerebral atrophy, advanced for age. Vascular: No hyperdense vessel or unexpected calcification. Skull: Normal. Negative for fracture or focal lesion. Sinuses/Orbits: No acute finding.   Underdeveloped mastoid air cells. Other: Small right frontal scalp hematoma. CT CERVICAL SPINE FINDINGS Alignment: Degenerative trace retrolisthesis at C2-C3 and trace anterolisthesis at C4-C5. No traumatic malalignment. Skull base and vertebrae: No acute fracture. No primary bone lesion or focal pathologic process. Soft tissues and spinal canal: No prevertebral fluid or swelling. No visible canal hematoma. Disc levels: Moderate degenerative disc disease and uncovertebral hypertrophy at C2-C3 and from C5-C6 through C7-T1. Moderate left-sided facet arthropathy throughout the cervical spine. Upper chest: Negative. Other: None. IMPRESSION: 1. Motion limited exam. No definite acute intracranial abnormality. Small right frontal scalp hematoma. 2. No definite acute cervical spine fracture. Moderate degenerative changes of the cervical spine. Electronically Signed   By: Obie Dredge M.D.   On: 06/11/2018 12:42   Mr Brain Wo Contrast  Result Date: 06/11/2018 CLINICAL DATA:  Initial evaluation for neuro decline, head trauma. EXAM: MRI HEAD WITHOUT CONTRAST TECHNIQUE: Multiplanar, multiecho pulse sequences of the brain and surrounding structures were obtained without intravenous contrast. COMPARISON:  Prior CT from earlier the same day. FINDINGS: Brain: Examination technically limited as the patient was unable to tolerate the full length of the exam. Axial DWI and T2 weighted sequences, with sagittal T1 weighted sequence were performed. Additionally, images provided are degraded by motion. Diffusion-weighted imaging demonstrates no evidence for acute or subacute ischemia. Cerebral volume grossly within normal limits for age. No definite encephalomalacia to suggest chronic infarction. No appreciable mass lesion. No midline shift or mass effect. No hydrocephalus. No extra-axial fluid collection. Vascular: Major intracranial vascular flow voids are grossly maintained at the skull base. Skull and upper cervical spine:  Craniocervical junction demonstrates no acute finding. Degree of basilar invagination related to down syndrome suspected. Bone marrow signal intensity within normal limits. No scalp soft tissue abnormality. Sinuses/Orbits: Globes and orbital soft tissues demonstrate no obvious abnormality. Paranasal sinuses grossly clear. Other: None. IMPRESSION: 1. Limited study due to patient's inability to tolerate the full length of the exam and motion artifact. 2. No definite acute intracranial abnormality. Electronically Signed   By: Rise Mu M.D.   On: 06/11/2018 18:40     Scheduled Meds: . [MAR Hold] acetaminophen  650 mg Oral Once  . [MAR Hold] calcium-vitamin D  2 tablet Oral Daily  . [MAR Hold] cholecalciferol  2,000 Units Oral Daily  . [MAR Hold] dexamethasone  4 mg Intravenous Q8H  . [MAR Hold] levothyroxine  25 mcg Oral QAC breakfast   Continuous Infusions: . sodium chloride 75 mL/hr at 06/11/18 2338  . lactated ringers      Pamella Pert, MD, PhD [facility] Pager [phone]  If 7PM-7AM, please contact night-coverage [email or web address] Password [facility] 06/12/2018, 12:18 PM

## 2018-06-12 NOTE — Progress Notes (Signed)
Reviewed MRI C spine- shows spinal cord trauma, no acute compression.  Primary team informed me and that neurosurgery has been consulted. Will sign off.

## 2018-06-12 NOTE — Progress Notes (Signed)
Orthopedic Tech Progress Note Patient Details:  Jasmine DodgeJennifer Ramirez Jun 03, 1966 [id number]  Patient ID: Jasmine DodgeJennifer Ramirez, female   DOB: Jun 03, 1966, 52 y.o.   MRN: [id number]   [name] 06/12/2018, 9:37 AMCalled [employer] for aspen collar.

## 2018-06-12 NOTE — Progress Notes (Signed)
Attempt to call report.

## 2018-06-12 NOTE — Transfer of Care (Signed)
Immediate Anesthesia Transfer of Care Note  Patient: Jasmine Ramirez  Procedure(s) Performed: MRI WITH ANESTHESIA (N/A )  Patient Location: PACU  Anesthesia Type:General  Level of Consciousness: drowsy  Airway & Oxygen Therapy: Patient Spontanous Breathing  Post-op Assessment: Report given to RN, Post -op Vital signs reviewed and stable and Patient moving all extremities  Post vital signs: Reviewed and stable  Last Vitals:  Vitals Value Taken Time  BP 99/50 06/12/2018  1:09 PM  Temp    Pulse 71 06/12/2018  1:17 PM  Resp 12 06/12/2018  1:17 PM  SpO2 96 % 06/12/2018  1:17 PM  Vitals shown include unvalidated device data.  Last Pain:  Vitals:   06/12/18 0843  TempSrc: Axillary  PainSc:          Complications: No apparent anesthesia complications

## 2018-06-12 NOTE — Anesthesia Preprocedure Evaluation (Addendum)
Anesthesia Evaluation  Patient identified by MRN, date of birth, ID band Patient awake    Reviewed: Allergy & Precautions, NPO status , Patient's Chart, lab work & pertinent test results  Airway Mallampati: III  TM Distance: <3 FB Neck ROM: Limited    Dental  (+) Teeth Intact, Dental Advisory Given   Pulmonary neg pulmonary ROS,    breath sounds clear to auscultation       Cardiovascular + Peripheral Vascular Disease  + Valvular Problems/Murmurs  Rhythm:Regular Rate:Normal     Neuro/Psych PSYCHIATRIC DISORDERS Depression Dementia negative neurological ROS     GI/Hepatic negative GI ROS, Neg liver ROS,   Endo/Other  Hypothyroidism   Renal/GU negative Renal ROS     Musculoskeletal negative musculoskeletal ROS (+)   Abdominal (+) + obese,   Peds  Hematology   Anesthesia Other Findings   Reproductive/Obstetrics                            Anesthesia Physical Anesthesia Plan  ASA: II  Anesthesia Plan: General   Post-op Pain Management:    Induction: Intravenous  PONV Risk Score and Plan: 4 or greater and Ondansetron and Midazolam  Airway Management Planned: Oral ETT and Video Laryngoscope Planned  Additional Equipment: None  Intra-op Plan:   Post-operative Plan: Extubation in OR  Informed Consent: I have reviewed the patients History and Physical, chart, labs and discussed the procedure including the risks, benefits and alternatives for the proposed anesthesia with the patient or authorized representative who has indicated his/her understanding and acceptance.   Dental advisory given  Plan Discussed with: CRNA  Anesthesia Plan Comments:        Anesthesia Quick Evaluation

## 2018-06-12 NOTE — Consult Note (Signed)
Reason for Consult:cord contusion Referring Physician: edp  Mellody Masri is an 52 y.o. female.   HPI:  52 year old patient presented to the ED last night after falling forward on her forehead. She resides in an assisted living. She has a history of down syndrome and dementia.  After her fall she was unable to ambulate without assistance which is different from her baseline as she is independent. She was unable to cooperate last night to get an MRI so they had to do it under anesthesia this morning. She does not report any neck or arm pain but history and exam is difficult to obtain as she is not very verbal and does not follow commands very well. Spoke with physician last night, ordered aspen collar and decadron until MRI could be done  Past Medical History:  Diagnosis Date  . Allergy 06/11/2018  . Chicken pox 06/11/2018  . Down syndrome 06/11/2018  . Heart murmur 06/11/2018    Past Surgical History:  Procedure Laterality Date  . KNEE SURGERY  2013    No Known Allergies  Social History   Tobacco Use  . Smoking status: Never Smoker  Substance Use Topics  . Alcohol use: Not on file    Family History  Problem Relation Age of Onset  . Cancer Father        Prostate  . Diabetes Father   . Cancer Maternal Grandmother        Breast  . Cancer Paternal Grandfather        Colon     Review of Systems  Positive ROS: as above  All other systems have been reviewed and were otherwise negative with the exception of those mentioned in the HPI and as above.  Objective: Vital signs in last 24 hours: Temp:  [97.5 F (36.4 C)-98.6 F (37 C)] 97.5 F (36.4 C) (07/27 1336) Pulse Rate:  [43-94] 55 (07/27 1336) Resp:  [9-20] 11 (07/27 1336) BP: (84-123)/(45-78) 98/47 (07/27 1334) SpO2:  [92 %-100 %] 95 % (07/27 1336) Weight:  [78.3 kg (172 lb 9.9 oz)] 78.3 kg (172 lb 9.9 oz) (07/27 0434)  General Appearance: Alert,not cooperative, no distress, appears stated age Head:  Normocephalic, without obvious abnormality, bruise to forehead Eyes: conjunctiva/corneas clear, EOM's intact  Back: Symmetric, no curvature, ROM normal, no CVA tenderness Lungs: respirations unlabored Heart: Regular rate and rhythm Skin: Skin color, texture, turgor normal, no rashes or lesions, bruising on forehead  NEUROLOGIC:   Mental status: unable to examine Motor Exam - RUE 2+, LUE4+, lower extremities 4+ Sensory Exam - grossly normal Reflexes: symmetric, no pathologic reflexes, No Hoffman's, No clonus Coordination - not tested Gait - not tested Balance - not tested Cranial Nerves: I: smell Not tested  II: visual acuity  OS: na  OD: na  II: visual fields   II: pupils   III,VII: ptosis   III,IV,VI: extraocular muscles    V: mastication   V: facial light touch sensation    V,VII: corneal reflex    VII: facial muscle function - upper    VII: facial muscle function - lower   VIII: hearing   IX: soft palate elevation    IX,X: gag reflex   XI: trapezius strength    XI: sternocleidomastoid strength   XI: neck flexion strength    XII: tongue strength      Data Review Lab Results  Component Value Date   WBC 7.4 06/11/2018   HGB 14.7 06/11/2018   HCT 42.5 06/11/2018  MCV 96.4 06/11/2018   PLT 171 06/11/2018   Lab Results  Component Value Date   NA 137 06/11/2018   K 4.4 06/11/2018   CL 102 06/11/2018   CO2 27 06/11/2018   BUN 19 06/11/2018   CREATININE 0.87 06/11/2018   GLUCOSE 117 (H) 06/11/2018   No results found for: INR, PROTIME  Radiology: Dg Chest 2 View  Result Date: 06/11/2018 CLINICAL DATA:  Altered mental status.  Recent fall. EXAM: CHEST - 2 VIEW COMPARISON:  None. FINDINGS: There is mild left base atelectasis. No edema or consolidation. Heart size and pulmonary vascularity are normal. No adenopathy. No pneumothorax. There is a bone island in the right proximal humerus. There is a probable bone infarct in the proximal left humerus. IMPRESSION: Slight  atelectasis left base. No edema or consolidation. Heart size within normal limits. No pneumothorax evident. Electronically Signed   By: Bretta BangWilliam  Woodruff III M.D.   On: 06/11/2018 15:05   Ct Head Wo Contrast  Result Date: 06/11/2018 CLINICAL DATA:  Altered mental status.  Fall this morning. EXAM: CT HEAD WITHOUT CONTRAST CT CERVICAL SPINE WITHOUT CONTRAST TECHNIQUE: Multidetector CT imaging of the head and cervical spine was performed following the standard protocol without intravenous contrast. Multiplanar CT image reconstructions of the cervical spine were also generated. COMPARISON:  None. FINDINGS: Examination is limited by motion artifact. CT HEAD FINDINGS Brain: No evidence of acute infarction, hemorrhage, hydrocephalus, extra-axial collection or mass lesion/mass effect. Mild generalized cerebral atrophy, advanced for age. Vascular: No hyperdense vessel or unexpected calcification. Skull: Normal. Negative for fracture or focal lesion. Sinuses/Orbits: No acute finding.  Underdeveloped mastoid air cells. Other: Small right frontal scalp hematoma. CT CERVICAL SPINE FINDINGS Alignment: Degenerative trace retrolisthesis at C2-C3 and trace anterolisthesis at C4-C5. No traumatic malalignment. Skull base and vertebrae: No acute fracture. No primary bone lesion or focal pathologic process. Soft tissues and spinal canal: No prevertebral fluid or swelling. No visible canal hematoma. Disc levels: Moderate degenerative disc disease and uncovertebral hypertrophy at C2-C3 and from C5-C6 through C7-T1. Moderate left-sided facet arthropathy throughout the cervical spine. Upper chest: Negative. Other: None. IMPRESSION: 1. Motion limited exam. No definite acute intracranial abnormality. Small right frontal scalp hematoma. 2. No definite acute cervical spine fracture. Moderate degenerative changes of the cervical spine. Electronically Signed   By: Obie DredgeWilliam T Derry M.D.   On: 06/11/2018 12:42   Ct Cervical Spine Wo  Contrast  Result Date: 06/11/2018 CLINICAL DATA:  Altered mental status.  Fall this morning. EXAM: CT HEAD WITHOUT CONTRAST CT CERVICAL SPINE WITHOUT CONTRAST TECHNIQUE: Multidetector CT imaging of the head and cervical spine was performed following the standard protocol without intravenous contrast. Multiplanar CT image reconstructions of the cervical spine were also generated. COMPARISON:  None. FINDINGS: Examination is limited by motion artifact. CT HEAD FINDINGS Brain: No evidence of acute infarction, hemorrhage, hydrocephalus, extra-axial collection or mass lesion/mass effect. Mild generalized cerebral atrophy, advanced for age. Vascular: No hyperdense vessel or unexpected calcification. Skull: Normal. Negative for fracture or focal lesion. Sinuses/Orbits: No acute finding.  Underdeveloped mastoid air cells. Other: Small right frontal scalp hematoma. CT CERVICAL SPINE FINDINGS Alignment: Degenerative trace retrolisthesis at C2-C3 and trace anterolisthesis at C4-C5. No traumatic malalignment. Skull base and vertebrae: No acute fracture. No primary bone lesion or focal pathologic process. Soft tissues and spinal canal: No prevertebral fluid or swelling. No visible canal hematoma. Disc levels: Moderate degenerative disc disease and uncovertebral hypertrophy at C2-C3 and from C5-C6 through C7-T1. Moderate left-sided facet arthropathy throughout  the cervical spine. Upper chest: Negative. Other: None. IMPRESSION: 1. Motion limited exam. No definite acute intracranial abnormality. Small right frontal scalp hematoma. 2. No definite acute cervical spine fracture. Moderate degenerative changes of the cervical spine. Electronically Signed   By: Obie Dredge M.D.   On: 06/11/2018 12:42   Mr Brain Wo Contrast  Result Date: 06/11/2018 CLINICAL DATA:  Initial evaluation for neuro decline, head trauma. EXAM: MRI HEAD WITHOUT CONTRAST TECHNIQUE: Multiplanar, multiecho pulse sequences of the brain and surrounding  structures were obtained without intravenous contrast. COMPARISON:  Prior CT from earlier the same day. FINDINGS: Brain: Examination technically limited as the patient was unable to tolerate the full length of the exam. Axial DWI and T2 weighted sequences, with sagittal T1 weighted sequence were performed. Additionally, images provided are degraded by motion. Diffusion-weighted imaging demonstrates no evidence for acute or subacute ischemia. Cerebral volume grossly within normal limits for age. No definite encephalomalacia to suggest chronic infarction. No appreciable mass lesion. No midline shift or mass effect. No hydrocephalus. No extra-axial fluid collection. Vascular: Major intracranial vascular flow voids are grossly maintained at the skull base. Skull and upper cervical spine: Craniocervical junction demonstrates no acute finding. Degree of basilar invagination related to down syndrome suspected. Bone marrow signal intensity within normal limits. No scalp soft tissue abnormality. Sinuses/Orbits: Globes and orbital soft tissues demonstrate no obvious abnormality. Paranasal sinuses grossly clear. Other: None. IMPRESSION: 1. Limited study due to patient's inability to tolerate the full length of the exam and motion artifact. 2. No definite acute intracranial abnormality. Electronically Signed   By: Rise Mu M.D.   On: 06/11/2018 18:40   Mr Cervical Spine Wo Contrast  Result Date: 06/12/2018 CLINICAL DATA:  Cervical spine trauma with ligamentous injury suspected. Arm and leg weakness after fall with forehead injury today. EXAM: MRI CERVICAL SPINE WITHOUT CONTRAST TECHNIQUE: Multiplanar, multisequence MR imaging of the cervical spine was performed. No intravenous contrast was administered. COMPARISON:  Cervical spine CT from yesterday FINDINGS: Alignment: C4-5 2 mm of anterolisthesis which is likely chronic and facet mediated. Slight anterolisthesis at C7-T1. No edema at this level. Vertebrae: No  occult fracture is noted. Cord: Focal central and dorsal cord edema at C2 and C3 with mild expansion. Given the history this is most consistent with contusion. No blood products or canal collection. Posterior Fossa, vertebral arteries, paraspinal tissues: Prevertebral edema at C2 and C3. Intrinsic neck muscle edema at C2 and C3 dorsally The ligamentum flavum appears to be intact. No acute disc injury is seen. Disc levels: C2-3: Disc narrowing and endplate ridging. Left uncovertebral and facet spurring. Mild to moderate left foraminal narrowing C3-4: Unremarkable. C4-5: Facet spurring greater on the left. Mild anterolisthesis. No herniation or impingement C5-6: Disc narrowing with endplate ridging. Patent canal and foramina C6-7: Disc narrowing with endplate ridging. Negative facets. No impingement C7-T1:Mild facet spurring on the left. Slight anterolisthesis. Disc narrowing and endplate degeneration. No impingement. These results were called by telephone at the time of interpretation on 06/12/2018 at 1:19 pm to Dr. Elvera Lennox , who verbally acknowledged these results. IMPRESSION: 1. Cord contusion at C2 and C3 without blood products. No canal hematoma or impingement. 2. Prevertebral edema and posterior muscular strain at C2 and C3. No major ligamentous disruption noted. No occult fracture. 3. Degenerative changes described above. Electronically Signed   By: Marnee Spring M.D.   On: 06/12/2018 13:32     Assessment/Plan: 52 year old female with a history of down syndrome and dementia presented  to the ED last night after a fall. CT was unremarkable for any fractures. MRI showed a cord contusion at C2-3 with no spinal stenosis or cord compression. Chronic anterolisthesis at C7-T1 and C4-5. We will put her on scheduled decadron and keep her in the aspen collar. Will get flex-ex xrays in a couple weeks to make sure she does not have any ligamentous injury. No surgical intervention indicated at this time.    Tiana Loft Meyran 06/12/2018 5:05 PM

## 2018-06-13 ENCOUNTER — Encounter (HOSPITAL_COMMUNITY): Payer: Self-pay

## 2018-06-13 DIAGNOSIS — E039 Hypothyroidism, unspecified: Secondary | ICD-10-CM

## 2018-06-13 DIAGNOSIS — S14109D Unspecified injury at unspecified level of cervical spinal cord, subsequent encounter: Secondary | ICD-10-CM

## 2018-06-13 DIAGNOSIS — F0280 Dementia in other diseases classified elsewhere without behavioral disturbance: Secondary | ICD-10-CM

## 2018-06-13 NOTE — Progress Notes (Signed)
NEUROSURGERY PROGRESS NOTE  Doing well. Aspen collar was fitted appropriately. Resting comfortably in bed  Temp:  [97.5 F (36.4 C)-98.5 F (36.9 C)] 98.5 F (36.9 C) (07/28 0815) Pulse Rate:  [50-94] 50 (07/28 0815) Resp:  [9-18] 16 (07/28 0815) BP: (84-115)/(46-67) 110/67 (07/28 0815) SpO2:  [86 %-98 %] 96 % (07/28 0815)  Plan: Would recommend a 2 week taper of decadron. Will follow up with her in our office in a couple weeks. No new nsgy recom.   Sherryl MangesKimberly Hannah Jnya Brossard, NP 06/13/2018 8:47 AM

## 2018-06-13 NOTE — Progress Notes (Signed)
PROGRESS NOTE  Jasmine Ramirez ZOX:[id number] DOB: Sep 27, 1966 DOA: 06/11/2018 PCP: Lucretia Field   LOS: 2 days   Brief Narrative / Interim history: 52 year old female with history of dementia, Down syndrome, presented to the hospital with an unwitnessed fall in her group home.  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  Neurosurgery consulted, discussed with Dr. Wynetta Emery this morning, recommending a 2-week taper of Decadron and outpatient follow-up in 2 weeks.  She is to wear a c-collar meanwhile -Obtain PT evaluation, the outpatient will be able to go to group home and may need to be in an SNF until this is improving  Down syndrome with Alzheimer's -Chronic and at baseline  Hypothyroidism -Continue Synthroid   DVT prophylaxis: SCDs Code Status: Full code Family Communication: Discussed with sister outside the room Disposition Plan: Will likely need SNF, PT evaluation pending  Consultants:   Neurology   Neurosurgery  Procedures:   None   Antimicrobials:  None    Subjective: -Seems comfortable, answers my questions intermittently but does not seem to have any acute complaints  Objective: Vitals:   06/12/18 1742 06/12/18 2302 06/13/18 0357 06/13/18 0815  BP: (!) 101/53 (!) 115/56 (!) 110/54 110/67  Pulse: 69 (!) 54 61 (!) 50  Resp: 18 16  16   Temp:  98.3 F (36.8 C) 98.3 F (36.8 C) 98.5 F (36.9 C)  TempSrc:    Axillary  SpO2: 96% (!) 86% 91% 96%  Weight:       Height:        Intake/Output Summary (Last 24 hours) at 06/13/2018 0948 Last data filed at 06/13/2018 0700 Gross per 24 hour  Intake 1998.73 ml  Output 1750 ml  Net 248.73 ml   Filed Weights   06/12/18 0434  Weight: 78.3 kg (172 lb 9.9 oz)    Examination:  Constitutional: No distress Eyes: No scleral icterus, lids and conjunctivae normal ENMT: Moist mucous membranes Neck: C-collar in place Respiratory: Clear to auscultation bilaterally on anterior fields without wheezing or crackles Cardiovascular: Regular rate and rhythm without murmurs.  No peripheral edema Abdomen: Soft, nontender, nondistended, positive bowel sounds Skin: No rashes Neurologic: Appears to spontaneously move bilateral lower extremities and right upper extremity, still does not move left upper arm for me    Data Reviewed: I have independently reviewed following labs and imaging studies   CBC: Recent Labs  Lab 06/11/18 1139  WBC 7.4  HGB 14.7  HCT 42.5  MCV 96.4  PLT 171   Basic Metabolic Panel: Recent Labs  Lab 06/11/18 1139 06/11/18 2030  NA 137  --   K 4.4  --   CL 102  --   CO2 27  --   GLUCOSE 117*  --   BUN 19  --   CREATININE 0.87  --   CALCIUM 9.0  --   MG  --  2.0   GFR: Estimated Creatinine Clearance: 66 mL/min (by C-G formula based on SCr of 0.87 mg/dL). Liver Function Tests: Recent  Labs  Lab 06/11/18 1139  AST 30  ALT 27  ALKPHOS 53  BILITOT 0.7  PROT 6.7  ALBUMIN 3.6   No results for input(s): LIPASE, AMYLASE in the last 168 hours. No results for input(s): AMMONIA in the last 168 hours. Coagulation Profile: No results for input(s): INR, PROTIME in the last 168 hours. Cardiac Enzymes: No results for input(s): CKTOTAL, CKMB, CKMBINDEX, TROPONINI in the last 168 hours. BNP (last 3 results) No results for input(s): PROBNP in the last 8760 hours. HbA1C: No results for input(s): HGBA1C in the last 72 hours. CBG: No results for input(s): GLUCAP in the last 168  hours. Lipid Profile: No results for input(s): CHOL, HDL, LDLCALC, TRIG, CHOLHDL, LDLDIRECT in the last 72 hours. Thyroid Function Tests: No results for input(s): TSH, T4TOTAL, FREET4, T3FREE, THYROIDAB in the last 72 hours. Anemia Panel: No results for input(s): VITAMINB12, FOLATE, FERRITIN, TIBC, IRON, RETICCTPCT in the last 72 hours. Urine analysis:    Component Value Date/Time   COLORURINE YELLOW 06/11/2018 1644   APPEARANCEUR CLEAR 06/11/2018 1644   LABSPEC 1.014 06/11/2018 1644   PHURINE 8.0 06/11/2018 1644   GLUCOSEU NEGATIVE 06/11/2018 1644   HGBUR NEGATIVE 06/11/2018 1644   BILIRUBINUR NEGATIVE 06/11/2018 1644   KETONESUR NEGATIVE 06/11/2018 1644   PROTEINUR NEGATIVE 06/11/2018 1644   NITRITE NEGATIVE 06/11/2018 1644   LEUKOCYTESUR NEGATIVE 06/11/2018 1644   Sepsis Labs: Invalid input(s): PROCALCITONIN, LACTICIDVEN  No results found for this or any previous visit (from the past 240 hour(s)).    Radiology Studies: Dg Chest 2 View  Result Date: 06/11/2018 CLINICAL DATA:  Altered mental status.  Recent fall. EXAM: CHEST - 2 VIEW COMPARISON:  None. FINDINGS: There is mild left base atelectasis. No edema or consolidation. Heart size and pulmonary vascularity are normal. No adenopathy. No pneumothorax. There is a bone island in the right proximal humerus. There is a probable bone infarct in the proximal left humerus. IMPRESSION: Slight atelectasis left base. No edema or consolidation. Heart size within normal limits. No pneumothorax evident. Electronically Signed   By: Bretta Bang III M.D.   On: 06/11/2018 15:05   Ct Head Wo Contrast  Result Date: 06/11/2018 CLINICAL DATA:  Altered mental status.  Fall this morning. EXAM: CT HEAD WITHOUT CONTRAST CT CERVICAL SPINE WITHOUT CONTRAST TECHNIQUE: Multidetector CT imaging of the head and cervical spine was performed following the standard protocol without intravenous contrast. Multiplanar CT image reconstructions of the cervical  spine were also generated. COMPARISON:  None. FINDINGS: Examination is limited by motion artifact. CT HEAD FINDINGS Brain: No evidence of acute infarction, hemorrhage, hydrocephalus, extra-axial collection or mass lesion/mass effect. Mild generalized cerebral atrophy, advanced for age. Vascular: No hyperdense vessel or unexpected calcification. Skull: Normal. Negative for fracture or focal lesion. Sinuses/Orbits: No acute finding.  Underdeveloped mastoid air cells. Other: Small right frontal scalp hematoma. CT CERVICAL SPINE FINDINGS Alignment: Degenerative trace retrolisthesis at C2-C3 and trace anterolisthesis at C4-C5. No traumatic malalignment. Skull base and vertebrae: No acute fracture. No primary bone lesion or focal pathologic process. Soft tissues and spinal canal: No prevertebral fluid or swelling. No visible canal hematoma. Disc levels: Moderate degenerative disc disease and uncovertebral hypertrophy at C2-C3 and from C5-C6 through C7-T1. Moderate left-sided facet arthropathy throughout the cervical spine. Upper chest: Negative. Other: None. IMPRESSION: 1. Motion limited exam. No definite acute intracranial abnormality. Small right frontal scalp hematoma. 2. No definite acute cervical spine fracture. Moderate degenerative changes of the cervical spine. Electronically Signed   By: Chrissie Noa  Howell Pringle M.D.   On: 06/11/2018 12:42   Ct Cervical Spine Wo Contrast  Result Date: 06/11/2018 CLINICAL DATA:  Altered mental status.  Fall this morning. EXAM: CT HEAD WITHOUT CONTRAST CT CERVICAL SPINE WITHOUT CONTRAST TECHNIQUE: Multidetector CT imaging of the head and cervical spine was performed following the standard protocol without intravenous contrast. Multiplanar CT image reconstructions of the cervical spine were also generated. COMPARISON:  None. FINDINGS: Examination is limited by motion artifact. CT HEAD FINDINGS Brain: No evidence of acute infarction, hemorrhage, hydrocephalus, extra-axial collection or  mass lesion/mass effect. Mild generalized cerebral atrophy, advanced for age. Vascular: No hyperdense vessel or unexpected calcification. Skull: Normal. Negative for fracture or focal lesion. Sinuses/Orbits: No acute finding.  Underdeveloped mastoid air cells. Other: Small right frontal scalp hematoma. CT CERVICAL SPINE FINDINGS Alignment: Degenerative trace retrolisthesis at C2-C3 and trace anterolisthesis at C4-C5. No traumatic malalignment. Skull base and vertebrae: No acute fracture. No primary bone lesion or focal pathologic process. Soft tissues and spinal canal: No prevertebral fluid or swelling. No visible canal hematoma. Disc levels: Moderate degenerative disc disease and uncovertebral hypertrophy at C2-C3 and from C5-C6 through C7-T1. Moderate left-sided facet arthropathy throughout the cervical spine. Upper chest: Negative. Other: None. IMPRESSION: 1. Motion limited exam. No definite acute intracranial abnormality. Small right frontal scalp hematoma. 2. No definite acute cervical spine fracture. Moderate degenerative changes of the cervical spine. Electronically Signed   By: Obie Dredge M.D.   On: 06/11/2018 12:42   Mr Brain Wo Contrast  Result Date: 06/11/2018 CLINICAL DATA:  Initial evaluation for neuro decline, head trauma. EXAM: MRI HEAD WITHOUT CONTRAST TECHNIQUE: Multiplanar, multiecho pulse sequences of the brain and surrounding structures were obtained without intravenous contrast. COMPARISON:  Prior CT from earlier the same day. FINDINGS: Brain: Examination technically limited as the patient was unable to tolerate the full length of the exam. Axial DWI and T2 weighted sequences, with sagittal T1 weighted sequence were performed. Additionally, images provided are degraded by motion. Diffusion-weighted imaging demonstrates no evidence for acute or subacute ischemia. Cerebral volume grossly within normal limits for age. No definite encephalomalacia to suggest chronic infarction. No  appreciable mass lesion. No midline shift or mass effect. No hydrocephalus. No extra-axial fluid collection. Vascular: Major intracranial vascular flow voids are grossly maintained at the skull base. Skull and upper cervical spine: Craniocervical junction demonstrates no acute finding. Degree of basilar invagination related to down syndrome suspected. Bone marrow signal intensity within normal limits. No scalp soft tissue abnormality. Sinuses/Orbits: Globes and orbital soft tissues demonstrate no obvious abnormality. Paranasal sinuses grossly clear. Other: None. IMPRESSION: 1. Limited study due to patient's inability to tolerate the full length of the exam and motion artifact. 2. No definite acute intracranial abnormality. Electronically Signed   By: Rise Mu M.D.   On: 06/11/2018 18:40   Mr Cervical Spine Wo Contrast  Result Date: 06/12/2018 CLINICAL DATA:  Cervical spine trauma with ligamentous injury suspected. Arm and leg weakness after fall with forehead injury today. EXAM: MRI CERVICAL SPINE WITHOUT CONTRAST TECHNIQUE: Multiplanar, multisequence MR imaging of the cervical spine was performed. No intravenous contrast was administered. COMPARISON:  Cervical spine CT from yesterday FINDINGS: Alignment: C4-5 2 mm of anterolisthesis which is likely chronic and facet mediated. Slight anterolisthesis at C7-T1. No edema at this level. Vertebrae: No occult fracture is noted. Cord: Focal central and dorsal cord edema at C2 and C3 with mild expansion. Given the history this is most consistent with contusion. No blood products or canal  collection. Posterior Fossa, vertebral arteries, paraspinal tissues: Prevertebral edema at C2 and C3. Intrinsic neck muscle edema at C2 and C3 dorsally The ligamentum flavum appears to be intact. No acute disc injury is seen. Disc levels: C2-3: Disc narrowing and endplate ridging. Left uncovertebral and facet spurring. Mild to moderate left foraminal narrowing C3-4:  Unremarkable. C4-5: Facet spurring greater on the left. Mild anterolisthesis. No herniation or impingement C5-6: Disc narrowing with endplate ridging. Patent canal and foramina C6-7: Disc narrowing with endplate ridging. Negative facets. No impingement C7-T1:Mild facet spurring on the left. Slight anterolisthesis. Disc narrowing and endplate degeneration. No impingement. These results were called by telephone at the time of interpretation on 06/12/2018 at 1:19 pm to Dr. Elvera LennoxGherghe , who verbally acknowledged these results. IMPRESSION: 1. Cord contusion at C2 and C3 without blood products. No canal hematoma or impingement. 2. Prevertebral edema and posterior muscular strain at C2 and C3. No major ligamentous disruption noted. No occult fracture. 3. Degenerative changes described above. Electronically Signed   By: Marnee SpringJonathon  Watts M.D.   On: 06/12/2018 13:32     Scheduled Meds: . acetaminophen  650 mg Oral Once  . calcium-vitamin D  2 tablet Oral Daily  . cholecalciferol  2,000 Units Oral Daily  . dexamethasone  6 mg Intravenous Q8H  . levothyroxine  25 mcg Oral QAC breakfast   Continuous Infusions: . sodium chloride 75 mL/hr at 06/13/18 0700    Pamella Pertostin Varonica Siharath, MD, PhD [facility] Pager [phone]  If 7PM-7AM, please contact night-coverage [email or web address] Password [email or web address] 06/13/2018, 9:48 AM

## 2018-06-14 ENCOUNTER — Encounter (HOSPITAL_COMMUNITY): Payer: Self-pay | Admitting: *Deleted

## 2018-06-14 NOTE — Plan of Care (Signed)
Jasmine Ramirez has a history of Down's Syndrome and impaired hearing.  She is oriented to herself and her situation and is calm/cooperative within the defined limits of her condition.  The patient has 1:1 sitter for safety, social presence, and to maintain the integrity of the patient's collar.  Nursing POC:  Continue 1:1 sitter for safety, assess collar frequently for proper positioning, 2 [facility] to [facility].

## 2018-06-14 NOTE — Progress Notes (Signed)
PROGRESS NOTE  Jasmine DodgeJennifer Vanderschaaf RUE:[id number] DOB: 05/10/1966 DOA: 06/11/2018 PCP: Lucretia Fieldoyals, [place] M   LOS: 3 days   Brief Narrative / Interim history: 52 year old female with history of dementia, Down syndrome, presented to the hospital with an unwitnessed fall in her group home.  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  Neurosurgery consulted, discussed with Dr. Wynetta Emeryram this morning, recommending a 2-week taper of Decadron and outpatient follow-up in 2 weeks.  She is to wear a c-collar meanwhile -PT evaluation pending today -Neuro status is stable, she still will not move her left upper extremity for me  Down syndrome with Alzheimer's -Chronic and at baseline  Hypothyroidism -Continue Synthroid   DVT prophylaxis: SCDs Code Status: Full code Family Communication: No family at bedside Disposition Plan: Will likely need SNF, PT evaluation pending  Consultants:   Neurology   Neurosurgery  Procedures:   None   Antimicrobials:  None    Subjective: -Seems comfortable, difficult communication due to underlying dementia and hard of hearing  Objective: Vitals:   06/13/18 2300 06/14/18 0046 06/14/18 0528 06/14/18 0803  BP: 113/65 115/66 114/70 116/63  Pulse:  (!) 59 89 (!) 59  Resp:  18 19 18   Temp: 98 F (36.7 C) 98.5 F (36.9 C) (!) 97.5 F (36.4 C) 98.3 F (36.8 C)  TempSrc:  Oral Axillary Oral  SpO2: 94% 94% 97% 100%  Weight:      Height:         Intake/Output Summary (Last 24 hours) at 06/14/2018 0916 Last data filed at 06/14/2018 0700 Gross per 24 hour  Intake 1830.9 ml  Output 900 ml  Net 930.9 ml   Filed Weights   06/12/18 0434  Weight: 78.3 kg (172 lb 9.9 oz)    Examination:  Constitutional: NAD Neck: C-collar in place Respiratory: CTA biL Cardiovascular: RRR Neuro: non focal except for LUE, she doesn't follow commands and still won't move that arm for me  Data Reviewed: I have independently reviewed following labs and imaging studies   CBC: Recent Labs  Lab 06/11/18 1139  WBC 7.4  HGB 14.7  HCT 42.5  MCV 96.4  PLT 171   Basic Metabolic Panel: Recent Labs  Lab 06/11/18 1139 06/11/18 2030  NA 137  --   K 4.4  --   CL 102  --   CO2 27  --   GLUCOSE 117*  --   BUN 19  --   CREATININE 0.87  --   CALCIUM 9.0  --   MG  --  2.0   GFR: Estimated Creatinine Clearance: 66 mL/min (by C-G formula based on SCr of 0.87 mg/dL). Liver Function Tests: Recent Labs  Lab 06/11/18 1139  AST 30  ALT 27  ALKPHOS 53  BILITOT 0.7  PROT 6.7  ALBUMIN 3.6   No results for input(s): LIPASE, AMYLASE in the last 168 hours. No results for input(s): AMMONIA in the last 168 hours. Coagulation Profile: No results for input(s): INR, PROTIME in the last  168 hours. Cardiac Enzymes: No results for input(s): CKTOTAL, CKMB, CKMBINDEX, TROPONINI in the last 168 hours. BNP (last 3 results) No results for input(s): PROBNP in the last 8760 hours. HbA1C: No results for input(s): HGBA1C in the last 72 hours. CBG: No results for input(s): GLUCAP in the last 168 hours. Lipid Profile: No results for input(s): CHOL, HDL, LDLCALC, TRIG, CHOLHDL, LDLDIRECT in the last 72 hours. Thyroid Function Tests: No results for input(s): TSH, T4TOTAL, FREET4, T3FREE, THYROIDAB in the last 72 hours. Anemia Panel: No results for input(s): VITAMINB12, FOLATE, FERRITIN, TIBC, IRON, RETICCTPCT in the last 72 hours. Urine analysis:     Component Value Date/Time   COLORURINE YELLOW 06/11/2018 1644   APPEARANCEUR CLEAR 06/11/2018 1644   LABSPEC 1.014 06/11/2018 1644   PHURINE 8.0 06/11/2018 1644   GLUCOSEU NEGATIVE 06/11/2018 1644   HGBUR NEGATIVE 06/11/2018 1644   BILIRUBINUR NEGATIVE 06/11/2018 1644   KETONESUR NEGATIVE 06/11/2018 1644   PROTEINUR NEGATIVE 06/11/2018 1644   NITRITE NEGATIVE 06/11/2018 1644   LEUKOCYTESUR NEGATIVE 06/11/2018 1644   Sepsis Labs: Invalid input(s): PROCALCITONIN, LACTICIDVEN  No results found for this or any previous visit (from the past 240 hour(s)).    Radiology Studies: Mr Cervical Spine Wo Contrast  Result Date: 06/12/2018 CLINICAL DATA:  Cervical spine trauma with ligamentous injury suspected. Arm and leg weakness after fall with forehead injury today. EXAM: MRI CERVICAL SPINE WITHOUT CONTRAST TECHNIQUE: Multiplanar, multisequence MR imaging of the cervical spine was performed. No intravenous contrast was administered. COMPARISON:  Cervical spine CT from yesterday FINDINGS: Alignment: C4-5 2 mm of anterolisthesis which is likely chronic and facet mediated. Slight anterolisthesis at C7-T1. No edema at this level. Vertebrae: No occult fracture is noted. Cord: Focal central and dorsal cord edema at C2 and C3 with mild expansion. Given the history this is most consistent with contusion. No blood products or canal collection. Posterior Fossa, vertebral arteries, paraspinal tissues: Prevertebral edema at C2 and C3. Intrinsic neck muscle edema at C2 and C3 dorsally The ligamentum flavum appears to be intact. No acute disc injury is seen. Disc levels: C2-3: Disc narrowing and endplate ridging. Left uncovertebral and facet spurring. Mild to moderate left foraminal narrowing C3-4: Unremarkable. C4-5: Facet spurring greater on the left. Mild anterolisthesis. No herniation or impingement C5-6: Disc narrowing with endplate ridging. Patent canal and foramina C6-7: Disc narrowing with endplate ridging.  Negative facets. No impingement C7-T1:Mild facet spurring on the left. Slight anterolisthesis. Disc narrowing and endplate degeneration. No impingement. These results were called by telephone at the time of interpretation on 06/12/2018 at 1:19 pm to Dr. Elvera Lennox , who verbally acknowledged these results. IMPRESSION: 1. Cord contusion at C2 and C3 without blood products. No canal hematoma or impingement. 2. Prevertebral edema and posterior muscular strain at C2 and C3. No major ligamentous disruption noted. No occult fracture. 3. Degenerative changes described above. Electronically Signed   By: Marnee Spring M.D.   On: 06/12/2018 13:32     Scheduled Meds: . acetaminophen  650 mg Oral Once  . calcium-vitamin D  2 tablet Oral Daily  . cholecalciferol  2,000 Units Oral Daily  . dexamethasone  6 mg Intravenous Q8H  . levothyroxine  25 mcg Oral QAC breakfast   Continuous Infusions:   Pamella Pert, MD, PhD [facility] Pager [phone]  If 7PM-7AM, please contact night-coverage [email or web address] Password [facility] 06/14/2018, 9:16 AM

## 2018-06-14 NOTE — Evaluation (Signed)
Physical Therapy Evaluation Patient Details Name: Jasmine Ramirez MRN: [id number] DOB: Apr 29, 1966 Today's Date: 06/14/2018   History of Present Illness  Patient is a 52 y/o female presenting with an unwitnessed fall, likely to forehead due to noted bruising. MRI revealing cord contusion at C2 and C3 without canal hematoma or impingement. Admitted for hyperextension injury of cervical spine. Patient with a PMH significant for Down syndrome, dementia.    Clinical Impression  Patient admitted with the above listed diagnosis. PLOF gleaned from medical chart as patient is a limited historian. Patient was a resident of a group home and was independent with mobility. Patient today requiring Mod A for sit to stand/stand pivot transfer with patient demonstrating poor safety awareness with limited carryover with max cueing. Further mobility not progressed today due to patient safety concerns. PT to recommend SNF at discharge to continue to progress safe functional mobility. PT to continue to follow acutely.     Follow Up Recommendations SNF;Supervision/Assistance - 24 hour    Equipment Recommendations  Other (comment)(TBD)    Recommendations for Other Services OT consult     Precautions / Restrictions Precautions Precautions: Fall;Cervical Required Braces or Orthoses: Cervical Brace Cervical Brace: Hard collar;At all times Restrictions Weight Bearing Restrictions: No      Mobility  Bed Mobility Overal bed mobility: Needs Assistance Bed Mobility: Supine to Sit     Supine to sit: Min assist;HOB elevated     General bed mobility comments: max cueing to complete bed mobility to EOB; Min A for trunk control  Transfers Overall transfer level: Needs assistance Equipment used: 1 person hand held assist Transfers: Sit to/from [employer] Sit to Stand: Mod assist Stand pivot transfers: Mod assist       General transfer comment: patient very unsafe with limited  carryover of safety cueing; tendency for excessive forward flexion; will likely need +2 for further mobility due to safety concerns  Ambulation/Gait                Stairs            Wheelchair Mobility    Modified Rankin (Stroke Patients Only)       Balance Overall balance assessment: Needs assistance Sitting-balance support: Single extremity supported;Feet supported Sitting balance-Leahy Scale: Fair     Standing balance support: Single extremity supported;During functional activity Standing balance-Leahy Scale: Poor                               Pertinent Vitals/Pain Pain Assessment: Faces Faces Pain Scale: Hurts a little bit Pain Location: at times beginning to cry, but unable to state presence or absence of pain Pain Intervention(s): Monitored during session    Home Living Family/patient expects to be discharged to:: Group home(vs SNF)                      Prior Function Level of Independence: Independent         Comments: per chart, ambulated without assistance     Hand Dominance        Extremity/Trunk Assessment   Upper Extremity Assessment Upper Extremity Assessment: Defer to OT evaluation    Lower Extremity Assessment Lower Extremity Assessment: Generalized weakness    Cervical / Trunk Assessment Cervical / Trunk Assessment: Kyphotic  Communication   Communication: Expressive difficulties(answers most questions with "yes")  Cognition Arousal/Alertness: Awake/alert Behavior During Therapy: Impulsive Overall Cognitive Status: No family/caregiver present to determine  baseline cognitive functioning                                 General Comments: history of cognitive impairments      General Comments      Exercises     Assessment/Plan    PT Assessment Patient needs continued PT services  PT Problem List Decreased strength;Decreased range of motion;Decreased activity tolerance;Decreased  balance;Decreased mobility;Decreased coordination;Decreased cognition;Decreased knowledge of use of DME;Decreased safety awareness       PT Treatment Interventions DME instruction;Gait training;Functional mobility training;Therapeutic activities;Therapeutic exercise;Balance training;Neuromuscular re-education;Patient/family education    PT Goals (Current goals can be found in the Care Plan section)  Acute Rehab PT Goals Patient Stated Goal: none stated PT Goal Formulation: With patient Time For Goal Achievement: 06/28/18 Potential to Achieve Goals: Fair    Frequency Min 3X/week   Barriers to discharge        Co-evaluation               AM-PAC PT "6 Clicks" Daily Activity  Outcome Measure Difficulty turning over in bed (including adjusting bedclothes, sheets and blankets)?: Unable Difficulty moving from lying on back to sitting on the side of the bed? : Unable Difficulty sitting down on and standing up from a chair with arms (e.g., wheelchair, bedside commode, etc,.)?: Unable Help needed moving to and from a bed to chair (including a wheelchair)?: A Lot Help needed walking in hospital room?: A Lot Help needed climbing 3-5 steps with a railing? : Total 6 Click Score: 8    End of Session Equipment Utilized During Treatment: Gait belt;Cervical collar Activity Tolerance: Patient tolerated treatment well Patient left: in chair;with call bell/phone within reach;with chair alarm set;with nursing/sitter in room Nurse Communication: Mobility status PT Visit Diagnosis: Unsteadiness on feet (R26.81);Other abnormalities of gait and mobility (R26.89);Muscle weakness (generalized) (M62.81);History of falling (Z91.81)    Time: [id number] PT Time Calculation (min) (ACUTE ONLY): 19 min   Charges:   PT Evaluation $PT Eval Moderate Complexity: 1 Mod          Kipp LaurenceStephanie R Aaron, PT, DPT 06/14/18 12:46 PM Pager: [phone]

## 2018-06-14 NOTE — Care Management Note (Signed)
Case Management Note  Patient Details  Name: Jasmine DodgeJennifer Oley MRN: [id number] Date of Birth: [birth year]  Subjective/Objective:      Pt in with C2-3 cord contusion after a fall. She is from [facility] group home.           Action/Plan: Awaiting PT/OT evals. CM following for d/c disposition.   Expected Discharge Date:  (unknown)               Expected Discharge Plan:  Group Home  In-House Referral:  Clinical Social Work  Discharge planning Services     Post Acute Care Choice:    Choice offered to:     DME Arranged:    DME Agency:     HH Arranged:    HH Agency:     Status of Service:  In process, will continue to follow  If discussed at Long Length of Stay Meetings, dates discussed:    Additional Comments:  Kermit BaloKelli F Krystyl Cannell, RN 06/14/2018, 11:39 AM

## 2018-06-15 NOTE — NC FL2 (Signed)
[place] MEDICAID FL2 LEVEL OF CARE SCREENING TOOL     IDENTIFICATION  Patient Name: Jasmine Ramirez Birthdate: Apr 12, 1966 Sex: female Admission Date (Current Location): 06/11/2018  [facility] and [place] Number:  Producer, television/film/video and Address:  [facility], [address] [place], [place] [address]      Provider Number: [id number]  Attending Physician Name and Address:  Leatha Gilding, MD  Relative Name and Phone Number:       Current Level of Care: Hospital Recommended Level of Care: Skilled Nursing Facility Prior Approval Number:    Date Approved/Denied:   PASRR Number: Manual review  Discharge Plan: SNF    Current Diagnoses: Patient Active Problem List   Diagnosis Date Noted  . Hyperextension injury of cervical spine, initial encounter 06/11/2018  . Alzheimer's disease with early onset 06/11/2018  . Spinal cord injury to cervical region without bone injury (HCC) 06/11/2018  . Well adult exam 10/09/2015  . Abnormal TSH 10/09/2015  . Rash and nonspecific skin eruption 10/09/2015  . Depression 02/27/2014  . Down syndrome 12/14/2013    Orientation RESPIRATION BLADDER Height & Weight     Self, Situation  Normal Continent Weight: 172 lb 9.9 oz (78.3 kg) Height:  4\' 10"  (147.3 cm)  BEHAVIORAL SYMPTOMS/MOOD NEUROLOGICAL BOWEL NUTRITION STATUS      Continent Diet(regular)  AMBULATORY STATUS COMMUNICATION OF NEEDS Skin   Limited Assist Verbally Normal                       Personal Care Assistance Level of Assistance  Bathing, Feeding, Dressing Bathing Assistance: Limited assistance Feeding assistance: Limited assistance Dressing Assistance: Limited assistance     Functional Limitations Info  Sight, Hearing, Speech Sight Info: Adequate Hearing Info: Impaired(hard of hearing) Speech Info: Adequate    SPECIAL CARE FACTORS FREQUENCY  PT (By licensed PT), OT (By licensed OT)     PT Frequency: 5x/wk OT Frequency:  5x/wk            Contractures Contractures Info: Not present    Additional Factors Info  Code Status, Allergies Code Status Info: Full Allergies Info: NKA           Current Medications (06/15/2018):  This is the current hospital active medication list Current Facility-Administered Medications  Medication Dose Route Frequency Provider Last Rate Last Dose  . acetaminophen (TYLENOL) tablet 650 mg  650 mg Oral Q6H PRN Hillary Bow, DO   650 mg at 06/14/18 0045   Or  . acetaminophen (TYLENOL) suppository 650 mg  650 mg Rectal Q6H PRN Hillary Bow, DO      . acetaminophen (TYLENOL) tablet 650 mg  650 mg Oral Once Tilden Fossa, MD   Stopped at 06/11/18 1646  . calcium-vitamin D (OSCAL WITH D) 500-200 MG-UNIT per tablet 2 tablet  2 tablet Oral Daily Hillary Bow, DO   2 tablet at 06/14/18 0846  . cholecalciferol (VITAMIN D) tablet 2,000 Units  2,000 Units Oral Daily Hillary Bow, DO   2,000 Units at 06/14/18 0845  . dexamethasone (DECADRON) injection 6 mg  6 mg Intravenous Q8H Leatha Gilding, MD   6 mg at 06/14/18 2144  . levothyroxine (SYNTHROID, LEVOTHROID) tablet 25 mcg  25 mcg Oral QAC breakfast Hillary Bow, DO   25 mcg at 06/14/18 0845  . ondansetron (ZOFRAN) tablet 4 mg  4 mg Oral Q6H PRN Hillary Bow, DO  Or  . ondansetron (ZOFRAN) injection 4 mg  4 mg Intravenous Q6H PRN Hillary BowGardner, Jared M, DO         Discharge Medications: Please see discharge summary for a list of discharge medications.  Relevant Imaging Results:  Relevant Lab Results:   Additional Information SS#: [id number]  Baldemar LenisElizabeth M Leondro Coryell, LCSW

## 2018-06-15 NOTE — Progress Notes (Signed)
PROGRESS NOTE  Jasmine Ramirez WJX:[id number] DOB: 1966-05-23 DOA: 06/11/2018 PCP: Lucretia Field   LOS: 4 days   Brief Narrative / Interim history: 52 year old female with history of dementia, Down syndrome, presented to the hospital with an unwitnessed fall in her group home.  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  Neurosurgery consulted, discussed with Dr. Wynetta Emery this morning, recommending a 2-week taper of Decadron and outpatient follow-up in 2 weeks.  She is to wear a c-collar meanwhile -Neuro status is stable, she still will not move her left upper extremity for me -PT evaluation recommending SNF, patient sister in agreement, discussed with Child psychotherapist.  Will discontinue sitter today, if stable potential he can be discharged tomorrow.  Down syndrome with Alzheimer's -Chronic and at baseline  Hypothyroidism -Continue Synthroid   DVT prophylaxis: SCDs Code Status: Full code Family Communication: No family at bedside Disposition Plan: SNF once 24 hours without sitter  Consultants:   Neurology   Neurosurgery  Procedures:   None   Antimicrobials:  None    Subjective: -Appears comfortable, has no complaints  Objective: Vitals:   06/14/18 1600 06/14/18 2350 06/15/18 0300 06/15/18 0947  BP: 124/63 (!) 113/58 116/71 112/61  Pulse: (!) 55 (!) 43 (!) 44 (!) 57  Resp: 18   16  Temp:  97.7 F (36.5 C) 97.9 F (36.6 C) 97.6 F (36.4 C)  TempSrc:  Oral Oral  Oral  SpO2: 97% 93% (!) 75% 96%  Weight:      Height:        Intake/Output Summary (Last 24 hours) at 06/15/2018 1034 Last data filed at 06/14/2018 1700 Gross per 24 hour  Intake 480 ml  Output -  Net 480 ml   Filed Weights   06/12/18 0434  Weight: 78.3 kg (172 lb 9.9 oz)    Examination:  Constitutional: NAD Neck: C-collar in place Respiratory: CTA Cardiovascular: RRR Neuro: non focal except for LUE, she doesn't follow commands and still won't move that arm for me  Data Reviewed: I have independently reviewed following labs and imaging studies   CBC: Recent Labs  Lab 06/11/18 1139  WBC 7.4  HGB 14.7  HCT 42.5  MCV 96.4  PLT 171   Basic Metabolic Panel: Recent Labs  Lab 06/11/18 1139 06/11/18 2030  NA 137  --   K 4.4  --   CL 102  --   CO2 27  --   GLUCOSE 117*  --   BUN 19  --   CREATININE 0.87  --   CALCIUM 9.0  --   MG  --  2.0   GFR: Estimated Creatinine Clearance: 66 mL/min (by C-G formula based on SCr of 0.87 mg/dL). Liver Function Tests: Recent Labs  Lab 06/11/18 1139  AST 30  ALT 27  ALKPHOS 53  BILITOT 0.7  PROT 6.7  ALBUMIN 3.6   No results for input(s): LIPASE, AMYLASE in the last 168 hours. No results for input(s): AMMONIA in the last 168 hours.  Coagulation Profile: No results for input(s): INR, PROTIME in the last 168 hours. Cardiac Enzymes: No results for input(s): CKTOTAL, CKMB, CKMBINDEX, TROPONINI in the last 168 hours. BNP (last 3 results) No results for input(s): PROBNP in the last 8760 hours. HbA1C: No results for input(s): HGBA1C in the last 72 hours. CBG: No results for input(s): GLUCAP in the last 168 hours. Lipid Profile: No results for input(s): CHOL, HDL, LDLCALC, TRIG, CHOLHDL, LDLDIRECT in the last 72 hours. Thyroid Function Tests: No results for input(s): TSH, T4TOTAL, FREET4, T3FREE, THYROIDAB in the last 72 hours. Anemia Panel: No results for input(s): VITAMINB12, FOLATE, FERRITIN, TIBC, IRON, RETICCTPCT in  the last 72 hours. Urine analysis:    Component Value Date/Time   COLORURINE YELLOW 06/11/2018 1644   APPEARANCEUR CLEAR 06/11/2018 1644   LABSPEC 1.014 06/11/2018 1644   PHURINE 8.0 06/11/2018 1644   GLUCOSEU NEGATIVE 06/11/2018 1644   HGBUR NEGATIVE 06/11/2018 1644   BILIRUBINUR NEGATIVE 06/11/2018 1644   KETONESUR NEGATIVE 06/11/2018 1644   PROTEINUR NEGATIVE 06/11/2018 1644   NITRITE NEGATIVE 06/11/2018 1644   LEUKOCYTESUR NEGATIVE 06/11/2018 1644   Sepsis Labs: Invalid input(s): PROCALCITONIN, LACTICIDVEN  No results found for this or any previous visit (from the past 240 hour(s)).    Radiology Studies: No results found.   Scheduled Meds: . acetaminophen  650 mg Oral Once  . calcium-vitamin D  2 tablet Oral Daily  . cholecalciferol  2,000 Units Oral Daily  . dexamethasone  6 mg Intravenous Q8H  . levothyroxine  25 mcg Oral QAC breakfast   Continuous Infusions:   Pamella Pertostin Jamilla Galli, MD, PhD [facility] Pager [phone]  If 7PM-7AM, please contact night-coverage [email or web address] Password [email or web address] 06/15/2018, 10:34 AM

## 2018-06-15 NOTE — Progress Notes (Signed)
NEUROSURGERY PROGRESS NOTE  Doing well. Aspen collar on and good alignment. Patient sitting up in bed eating breakfast. No changes overnight.   Temp:  [97.6 F (36.4 C)-97.9 F (36.6 C)] 97.9 F (36.6 C) (07/30 0300) Pulse Rate:  [43-67] 44 (07/30 0300) Resp:  [18] 18 (07/29 1600) BP: (111-124)/(58-71) 116/71 (07/30 0300) SpO2:  [75 %-98 %] 75 % (07/30 0300)   Sherryl MangesKimberly Hannah Nima Bamburg, NP 06/15/2018 8:29 AM

## 2018-06-15 NOTE — Clinical Social Work Note (Signed)
Clinical Social Work Assessment  Patient Details  Name: Jasmine Ramirez MRN: [id number] Date of Birth: 12-Nov-1966  Date of referral:  06/14/18               Reason for consult:  Facility Placement                Permission sought to share information with:  Facility Medical sales representativeContact Representative, Family Supports Permission granted to share information::  Yes, Verbal Permission Granted  Name::     Programmer, systemsicole  Agency::  SNF  Relationship::  Sister  Contact Information:     Housing/Transportation Living arrangements for the past 2 months:  Group Home Source of Information:  Medical Team, Siblings Patient Interpreter Needed:  None Criminal Activity/Legal Involvement Pertinent to Current Situation/Hospitalization:  No - Comment as needed Significant Relationships:  Siblings, Parents Lives with:  Self, Facility Resident Do you feel safe going back to the place where you live?  Yes Need for family participation in patient care:  No (Coment)  Care giving concerns:  Patient lives at a group home and was independent for mobility, but will need short term rehab to improve ability to mobilize on her own before returning to group home at discharge.   Social Worker assessment / plan:  CSW spoke with patient's sister over the phone to discuss recommendation for SNF. CSW explained recommendation and options available, will leave a list of SNF options for the sister to review. CSW answered questions and received permission to send out referral. CSW to follow.  Employment status:  Disabled (Comment on whether or not currently receiving Disability) Insurance information:  Managed Medicare PT Recommendations:  Skilled Nursing Facility Information / Referral to community resources:  Skilled Nursing Facility  Patient/Family's Response to care:  Patient's family agreeable to SNF placement.  Patient/Family's Understanding of and Emotional Response to Diagnosis, Current Treatment, and Prognosis:  Patient's  sister indicated that she's never had to be placed in rehab before so they are unfamiliar with the process. Patient's sister indicated that she would appreciate information and would do research to determine where to go.  Emotional Assessment Appearance:  Appears stated age Attitude/Demeanor/Rapport:  Unable to Assess Affect (typically observed):  Unable to Assess Orientation:  Oriented to Self, Oriented to Situation Alcohol / Substance use:  Not Applicable Psych involvement (Current and /or in the community):  No (Comment)  Discharge Needs  Concerns to be addressed:  Care Coordination Readmission within the last 30 days:  No Current discharge risk:  Physical Impairment, Cognitively Impaired, Dependent with Mobility Barriers to Discharge:  Continued Medical Work up, English as a second language teachernsurance Authorization, Requiring sitter/restraints   Baldemar Lenislizabeth M Omelia Marquart, LCSW 06/15/2018, 12:06 PM

## 2018-06-16 DIAGNOSIS — S1980XA Other specified injuries of unspecified part of neck, initial encounter: Secondary | ICD-10-CM

## 2018-06-16 LAB — BASIC METABOLIC PANEL
Anion gap: 9 (ref 5–15)
BUN: 28 mg/dL — ABNORMAL HIGH (ref 6–20)
CO2: 25 mmol/L (ref 22–32)
CREATININE: 1.1 mg/dL — AB (ref 0.44–1.00)
Calcium: 8.2 mg/dL — ABNORMAL LOW (ref 8.9–10.3)
Chloride: 104 mmol/L (ref 98–111)
GFR calc Af Amer: >60 mL/min (ref >60)
GFR calc non Af Amer: 56 mL/min — ABNORMAL LOW (ref >60)
Glucose, Bld: 187 mg/dL — ABNORMAL HIGH (ref 70–99)
Potassium: 3.8 mmol/L (ref 3.5–5.1)
SODIUM: 138 mmol/L (ref 135–145)

## 2018-06-16 LAB — GLUCOSE, CAPILLARY
GLUCOSE-CAPILLARY: 126 mg/dL — AB (ref 70–99)
GLUCOSE-CAPILLARY: 109 mg/dL — AB (ref 70–99)

## 2018-06-16 MED ORDER — SODIUM CHLORIDE 0.9 % IV SOLN
INTRAVENOUS | Status: DC
  Administered 2018-06-16 – 2018-06-18 (×3): via INTRAVENOUS

## 2018-06-16 MED ORDER — POLYETHYLENE GLYCOL 3350 17 G PO PACK: 17.0000 g | PACK | Freq: Every day | ORAL | Status: DC | PRN

## 2018-06-16 MED ORDER — INSULIN ASPART 100 UNIT/ML ~~LOC~~ SOLN
0.0000 [IU] | Freq: Three times a day (TID) | SUBCUTANEOUS | Status: DC
  Administered 2018-06-16 – 2018-06-17 (×2): 1 [IU] via SUBCUTANEOUS
  Administered 2018-06-18: 2 [IU] via SUBCUTANEOUS

## 2018-06-16 MED FILL — Glycopyrrolate Inj 0.2 MG/ML: INTRAMUSCULAR | Qty: 1 | Status: AC

## 2018-06-16 MED FILL — Lidocaine HCl(Cardiac) IV PF Soln Pref Syr 100 MG/5ML (2%): INTRAVENOUS | Qty: 5 | Status: AC

## 2018-06-16 MED FILL — Propofol IV Emul 200 MG/20ML (10 MG/ML): INTRAVENOUS | Qty: 20 | Status: AC

## 2018-06-16 MED FILL — Fentanyl Citrate Preservative Free (PF) Inj 100 MCG/2ML: INTRAMUSCULAR | Qty: 2 | Status: AC

## 2018-06-16 MED FILL — Ondansetron HCl Inj 4 MG/2ML (2 MG/ML): INTRAMUSCULAR | Qty: 2 | Status: AC

## 2018-06-16 MED FILL — Phenylephrine HCl IV Soln Pref Syr 0.4 MG/10ML (40 MCG/ML): INTRAVENOUS | Qty: 10 | Status: AC

## 2018-06-16 MED FILL — Succinylcholine Chloride Sol Pref Syr 200 MG/10ML (20 MG/ML): INTRAVENOUS | Qty: 10 | Status: AC

## 2018-06-16 NOTE — Progress Notes (Addendum)
PROGRESS NOTE    Jasmine DodgeJennifer Ramirez  WJX:[id number] DOB: 1965/12/28 DOA: 06/11/2018 PCP: Lucretia Fieldoyals, [place] M   Brief Narrative: 52 year old female with history of dementia, Down syndrome, presented to the hospital with an unwitnessed fall in her group home.  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  Neurosurgery consulted, recommend decadron 2 weeks taper.  and C collar.  Will check CBG while on decadron,PRN SSI   Down syndrome with Alzheimer's;  At baseline.   Hypothyroidism; continue with synthroid.   AKI; start IV fluids.     DVT prophylaxis: SCD Code Status: Full code.  Family Communication: multiples family member at bedside.  Disposition Plan: SNF  Consultants:  Neurosurgery   Procedures: none  Antimicrobials:   none   Subjective: In bed, mild neck pain. Tylenol helps.   Objective: Vitals:   06/16/18 0400 06/16/18 0814 06/16/18 1135 06/16/18 1602  BP: 123/62 123/72 100/62 (P) 119/68  Pulse: (!) 46 (!) 47 62 (!) (P) 59  Resp: 15 18 18  (P) 18  Temp: (!) 97.3 F (36.3 C) 97.9 F (36.6 C) 98.6 F (37 C) (P) 98.2 F (36.8 C)  TempSrc: Oral Oral Oral (P) Oral  SpO2: 97% 96% 100% (P) 100%  Weight:      Height:        Intake/Output Summary (Last 24 hours) at 06/16/2018 1633 Last data filed at 06/16/2018 1200 Gross per 24 hour  Intake 840 ml  Output -  Net 840 ml   Filed Weights   06/12/18 0434  Weight: 78.3 kg (172 lb 9.9 oz)     Examination:  General exam: Appears calm and comfortable , down syndrome.  Respiratory system: Clear to auscultation. Respiratory effort normal. Cardiovascular system: S1 & S2 heard, RRR. No JVD, murmurs, rubs, gallops or clicks. No pedal edema. Gastrointestinal system: Abdomen is nondistended, soft and nontender. No organomegaly or masses felt. Normal bowel sounds heard. Central nervous system: Alert, Extremities: Symmetric 5 x 5 power. Skin: No rashes, lesions or ulcers    Data Reviewed: I have personally reviewed following labs and imaging studies  CBC: Recent Labs  Lab 06/11/18 1139  WBC 7.4  HGB 14.7  HCT 42.5  MCV 96.4  PLT 171   Basic Metabolic Panel: Recent Labs  Lab 06/11/18 1139 06/11/18 2030 06/16/18 1241  NA 137  --  138  K 4.4  --  3.8  CL 102  --  104  CO2 27  --  25  GLUCOSE 117*  --  187*  BUN 19  --  28*  CREATININE 0.87  --  1.10*  CALCIUM 9.0  --  8.2*  MG  --  2.0  --    GFR: Estimated Creatinine Clearance: 52.2 mL/min (A) (by C-G formula based on SCr of 1.1 mg/dL (H)). Liver Function Tests: Recent Labs  Lab 06/11/18 1139  AST 30  ALT 27  ALKPHOS 53  BILITOT 0.7  PROT 6.7  ALBUMIN 3.6  No results for input(s): LIPASE, AMYLASE in the last 168 hours. No results for input(s): AMMONIA in the last 168 hours. Coagulation Profile: No results for input(s): INR, PROTIME in the last 168 hours. Cardiac Enzymes: No results for input(s): CKTOTAL, CKMB, CKMBINDEX, TROPONINI in the last 168 hours. BNP (last 3 results) No results for input(s): PROBNP in the last 8760 hours. HbA1C: No results for input(s): HGBA1C in the last 72 hours. CBG: No results for input(s): GLUCAP in the last 168 hours. Lipid Profile: No results for input(s): CHOL, HDL, LDLCALC, TRIG, CHOLHDL, LDLDIRECT in the last 72 hours. Thyroid Function Tests: No results for input(s): TSH, T4TOTAL, FREET4, T3FREE, THYROIDAB in the last 72 hours. Anemia Panel: No results for  input(s): VITAMINB12, FOLATE, FERRITIN, TIBC, IRON, RETICCTPCT in the last 72 hours. Sepsis Labs: No results for input(s): PROCALCITON, LATICACIDVEN in the last 168 hours.  No results found for this or any previous visit (from the past 240 hour(s)).       Radiology Studies: No results found.      Scheduled Meds: . acetaminophen  650 mg Oral Once  . calcium-vitamin D  2 tablet Oral Daily  . cholecalciferol  2,000 Units Oral Daily  . dexamethasone  6 mg Intravenous Q8H  . levothyroxine  25 mcg Oral QAC breakfast   Continuous Infusions: . sodium chloride       LOS: 5 days    Time spent: 35 minutes.     Alba Cory, MD [facility] Pager [phone]  If 7PM-7AM, please contact night-coverage [email or web address] Password [facility] 06/16/2018, 4:33 PM

## 2018-06-16 NOTE — Progress Notes (Addendum)
RN paged neurosurgery regarding pt's neck brace causing skin breakdown around neck, chin and facial area. K Meyran returned call with no order change at this time due to pt's current injury and this brace is the best option. RN explained to family at bedside at this time. Will continue to monitor and frequent skin assessment. Mepiplex borders applied.  Sim BoastHavy, RN

## 2018-06-16 NOTE — Progress Notes (Signed)
CSW following for discharge plan. CSW met earlier with Bethel Born from patient's group home and provided information about care at the hospital, with plan to transition to SNF for short term. Bethel Born requested a team meeting prior to discharge to discuss next steps for the patient's care, and CSW explained that it's probably best to occur at SNF because she won't be at the hospital for very long and the SNF will have more of an idea of how long they will want to keep her for rehabilitation. Sharonda expressed understanding.   Barrier to discharge is the patient's insurance status. SNF is unable to officially start an insurance authorization request as they are unable to figure out who to contact for authorization. Per Admissions at [facility], all of the numbers they have attempted to contact indicate that the patient is not covered through them, it's through a different office. CSW looked into patient's hospital billing account and sent numbers listed within notes there, in hopes that SNF can initiate an authorization request through patient's insurance. Bethel Born at patient's group home is also supposed to send copies of the patient's insurance cards to update patient's chart, as the last scanned copy of patient's insurance card is from 2015.   CSW to continue to follow.  Laveda Abbe, Geraldine Clinical Social Worker [phone]

## 2018-06-16 NOTE — Progress Notes (Signed)
CSW following for discharge plan. CSW spoke with patient's sister, Jasmine Ramirez, who confirmed preference for [name]. CSW contacted [name] and they attempted to run insurance, but they are not able to accept the patient's insurance. CSW provided information to patient's sister and discussed other bed offers; patient's [name]. [name] and they are checking insurance.  CSW to follow.  [name], [place] Clinical Social Worker [phone]

## 2018-06-16 NOTE — Progress Notes (Signed)
Patient resting quietly in bed no attempts at this time to exit bed she has obeyed my requests to stay in bed to this point vital signs are within exceptable range will continue to monitor.

## 2018-06-16 NOTE — Progress Notes (Signed)
Physical Therapy Treatment Patient Details Name: Jasmine Ramirez MRN: [id number] DOB: 11-25-1965 Today's Date: 06/16/2018    History of Present Illness Patient is a 52 y/o female presenting with an unwitnessed fall, likely to forehead due to noted bruising. MRI revealing cord contusion at C2 and C3 without canal hematoma or impingement. Admitted for hyperextension injury of cervical spine. Patient with a PMH significant for Down syndrome, dementia.    PT Comments    PT session today focusing on improving safe functional mobility. Patient requires +2 HHA for all mobility. Attempted use of RW for gait with patient unable/confused with using, therefore used +2 HHA. Very unsteady gait pattern throughout requiring max cueing, however with limited carryover. PT to continue to follow to promote improved functional mobility. PT recommendations remain appropriate.     Follow Up Recommendations  SNF;Supervision/Assistance - 24 hour     Equipment Recommendations  (TBD)    Recommendations for Other Services OT consult     Precautions / Restrictions Precautions Precautions: Fall;Cervical Required Braces or Orthoses: Cervical Brace Cervical Brace: Hard collar;At all times Restrictions Weight Bearing Restrictions: No    Mobility  Bed Mobility Overal bed mobility: Needs Assistance Bed Mobility: Supine to Sit     Supine to sit: HOB elevated;Min assist     General bed mobility comments: with max cueing with bring LE towards EOB, but then requiring assist for trunk control  Transfers Overall transfer level: Needs assistance Equipment used: 2 person hand held assist Transfers: Sit to/from Stand;Stand Pivot Transfers Sit to Stand: Min assist;Mod assist;+2 physical assistance Stand pivot transfers: Min assist;Mod assist;+2 physical assistance       General transfer comment: very unsteady with transfers with limited ability to follow cueing requiring max verbal and tactile  cueing  Ambulation/Gait Ambulation/Gait assistance: Mod assist;+2 physical assistance Gait Distance (Feet): 75 Feet Assistive device: 2 person hand held assist Gait Pattern/deviations: Step-to pattern;Step-through pattern;Decreased stride length;Shuffle;Staggering left;Staggering right;Trunk flexed Gait velocity: decreased   General Gait Details: very unsteady gait pattern; max cueing for safety with limited carryover   Stairs             Wheelchair Mobility    Modified Rankin (Stroke Patients Only)       Balance Overall balance assessment: Needs assistance Sitting-balance support: Single extremity supported;Feet supported Sitting balance-Leahy Scale: Fair     Standing balance support: Bilateral upper extremity supported;During functional activity Standing balance-Leahy Scale: Poor                              Cognition Arousal/Alertness: Awake/alert Behavior During Therapy: Impulsive Overall Cognitive Status: No family/caregiver present to determine baseline cognitive functioning                                 General Comments: history of cognitive impairments      Exercises      General Comments        Pertinent Vitals/Pain Pain Assessment: Faces Faces Pain Scale: Hurts a little bit Pain Location: generalized - neck Pain Intervention(s): Limited activity within patient's tolerance;Monitored during session;Repositioned    Home Living                      Prior Function            PT Goals (current goals can now be found in the care plan section) Acute Rehab  PT Goals Patient Stated Goal: none stated PT Goal Formulation: With patient Time For Goal Achievement: 06/28/18 Potential to Achieve Goals: Fair Progress towards PT goals: Progressing toward goals    Frequency    Min 3X/week      PT Plan Current plan remains appropriate    Co-evaluation              AM-PAC PT "6 Clicks" Daily Activity   Outcome Measure  Difficulty turning over in bed (including adjusting bedclothes, sheets and blankets)?: A Little Difficulty moving from lying on back to sitting on the side of the bed? : Unable Difficulty sitting down on and standing up from a chair with arms (e.g., wheelchair, bedside commode, etc,.)?: Unable Help needed moving to and from a bed to chair (including a wheelchair)?: A Lot Help needed walking in hospital room?: A Lot Help needed climbing 3-5 steps with a railing? : Total 6 Click Score: 10    End of Session Equipment Utilized During Treatment: Gait belt;Cervical collar Activity Tolerance: Patient tolerated treatment well Patient left: in bed;with call bell/phone within reach;with bed alarm set Nurse Communication: Mobility status PT Visit Diagnosis: Unsteadiness on feet (R26.81);Other abnormalities of gait and mobility (R26.89);Muscle weakness (generalized) (M62.81);History of falling (Z91.81)     Time: 1025-1050 PT Time Calculation (min) (ACUTE ONLY): 25 min  Charges:  $Gait Training: 8-22 mins $Therapeutic Activity: 8-22 mins                     Kipp LaurenceStephanie R Mulki Roesler, PT, DPT 06/16/18 12:36 PM Pager: [phone]

## 2018-06-16 NOTE — Care Management Important Message (Signed)
Important Message  Patient Details  Name: Jasmine DodgeJennifer Ramirez MRN: [id number] Date of Birth: 05/10/1966   Medicare Important Message Given:  Yes    Vonzell Lindblad Stefan ChurchBratton 06/16/2018, 4:32 PM

## 2018-06-17 LAB — BASIC METABOLIC PANEL
ANION GAP: 8 (ref 5–15)
BUN: 26 mg/dL — ABNORMAL HIGH (ref 6–20)
CALCIUM: 8.2 mg/dL — AB (ref 8.9–10.3)
CO2: 25 mmol/L (ref 22–32)
Chloride: 106 mmol/L (ref 98–111)
Creatinine, Ser: 0.83 mg/dL (ref 0.44–1.00)
GFR calc Af Amer: >60 mL/min (ref >60)
GFR calc non Af Amer: >60 mL/min (ref >60)
GLUCOSE: 118 mg/dL — AB (ref 70–99)
Potassium: 4.4 mmol/L (ref 3.5–5.1)
Sodium: 139 mmol/L (ref 135–145)

## 2018-06-17 LAB — GLUCOSE, CAPILLARY
GLUCOSE-CAPILLARY: 111 mg/dL — AB (ref 70–99)
GLUCOSE-CAPILLARY: 138 mg/dL — AB (ref 70–99)
Glucose-Capillary: 135 mg/dL — ABNORMAL HIGH (ref 70–99)
Glucose-Capillary: 93 mg/dL (ref 70–99)

## 2018-06-17 NOTE — Progress Notes (Signed)
CSW following for discharge plan. Barrier to discharge continues to be inability to initiate insurance authorization. Patient's sister called with additional information about patient's [employer] coverage, and provided to [facility]. They have continued to work on it throughout the day today with no success. CSW spoke with patient's staff at the group home, and they faxed over copies of the insurance cards that they have on file for the patient. Information is on the patient's shadow chart.   Group home staff is unable to provide adequate care for the patient at this time, they do not have staffing available for 2 person assist. Patient will need short term rehab to improve strength and mobility prior to return to group home.   CSW to continue to follow.  Blenda NicelyElizabeth Willy Pinkerton, [place] Clinical Social Worker [phone]

## 2018-06-17 NOTE — Progress Notes (Signed)
PROGRESS NOTE    Jasmine Ramirez  UEA:[id number] DOB: 1966-06-03 DOA: 06/11/2018 PCP: Lucretia Field   Brief Narrative: 52 year old female with history of dementia, Down syndrome, presented to the hospital with an unwitnessed fall in her group home.  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  Neurosurgery consulted, recommend decadron 2 weeks taper.  and C collar.  check CBG while on decadron,PRN SSI   Down syndrome with Alzheimer's;  At baseline.   Hypothyroidism; continue with synthroid.   AKI; improved with IV fluids.     DVT prophylaxis: SCD Code Status: Full code.  Family Communication: multiples family member at bedside.  Disposition Plan: SNF, awaiting  SNF   Consultants:  Neurosurgery   Procedures: none  Antimicrobials:   none   Subjective: She answer yes to questions. Report neck pain.  Denies abdominal pain   Objective: Vitals:   06/17/18 0050 06/17/18 0502 06/17/18 0740 06/17/18 1139  BP: 123/79 108/66 116/61 (!) 109/54  Pulse: 60 (!) 42 (!) 49 62  Resp: 20 12 16 18   Temp: 97.8 F (36.6 C) 98.8 F (37.1 C) 98.1 F (36.7 C) 98.1 F (36.7 C)  TempSrc: Oral Oral Axillary Oral  SpO2: 98% 96% 100%   Weight:      Height:        Intake/Output Summary (Last 24 hours) at 06/17/2018 1514 Last data filed at 06/17/2018 1400 Gross per 24 hour  Intake 2370.51 ml  Output -  Net 2370.51 ml   Filed Weights   06/12/18 0434  Weight: 78.3 kg (172 lb  9.9 oz)    Examination:  General exam:  down syndrome feature, NAD, pleasant .  Respiratory system: CTA, normal respiratory effort.  Cardiovascular system: S 1, S 2 RRR Gastrointestinal system: BS present, soft, nt Central nervous system: Alert.  Extremities: symmetric power.  Skin: no rashes.     Data Reviewed: I have personally reviewed following labs and imaging studies  CBC: Recent Labs  Lab 06/11/18 1139  WBC 7.4  HGB 14.7  HCT 42.5  MCV 96.4  PLT 171   Basic Metabolic Panel: Recent Labs  Lab 06/11/18 1139 06/11/18 2030 06/16/18 1241 06/17/18 0520  NA 137  --  138 139  K 4.4  --  3.8 4.4  CL 102  --  104 106  CO2 27  --  25 25  GLUCOSE 117*  --  187* 118*  BUN 19  --  28* 26*  CREATININE 0.87  --  1.10* 0.83  CALCIUM 9.0  --  8.2* 8.2*  MG  --  2.0  --   --    GFR: Estimated Creatinine Clearance: 69.2 mL/min (by C-G formula based on SCr of 0.83 mg/dL). Liver Function Tests: Recent Labs  Lab 06/11/18 1139  AST 30  ALT 27  ALKPHOS 53  BILITOT 0.7  PROT 6.7  ALBUMIN 3.6   No results for input(s): LIPASE, AMYLASE in the last 168 hours. No  results for input(s): AMMONIA in the last 168 hours. Coagulation Profile: No results for input(s): INR, PROTIME in the last 168 hours. Cardiac Enzymes: No results for input(s): CKTOTAL, CKMB, CKMBINDEX, TROPONINI in the last 168 hours. BNP (last 3 results) No results for input(s): PROBNP in the last 8760 hours. HbA1C: No results for input(s): HGBA1C in the last 72 hours. CBG: Recent Labs  Lab 06/16/18 1722 06/16/18 2116 06/17/18 0901 06/17/18 1107  GLUCAP 126* 109* 135* 93   Lipid Profile: No results for input(s): CHOL, HDL, LDLCALC, TRIG, CHOLHDL, LDLDIRECT in the last 72 hours. Thyroid Function Tests: No results for input(s): TSH, T4TOTAL, FREET4, T3FREE, THYROIDAB in the last 72 hours. Anemia Panel: No results for input(s): VITAMINB12, FOLATE, FERRITIN, TIBC, IRON, RETICCTPCT in the last 72  hours. Sepsis Labs: No results for input(s): PROCALCITON, LATICACIDVEN in the last 168 hours.  No results found for this or any previous visit (from the past 240 hour(s)).       Radiology Studies: No results found.      Scheduled Meds: . acetaminophen  650 mg Oral Once  . calcium-vitamin D  2 tablet Oral Daily  . cholecalciferol  2,000 Units Oral Daily  . dexamethasone  6 mg Intravenous Q8H  . insulin aspart  0-9 Units Subcutaneous TID WC  . levothyroxine  25 mcg Oral QAC breakfast   Continuous Infusions: . sodium chloride 75 mL/hr at 06/17/18 0855     LOS: 6 days    Time spent: 35 minutes.     Alba CoryBelkys A Persephone Schriever, MD [facility] Pager [phone]  If 7PM-7AM, please contact night-coverage [email or web address] Password [email or web address] 06/17/2018, 3:14 PM

## 2018-06-18 ENCOUNTER — Inpatient Hospital Stay (HOSPITAL_COMMUNITY): Payer: Medicare Other

## 2018-06-18 DIAGNOSIS — I5030 Unspecified diastolic (congestive) heart failure: Secondary | ICD-10-CM

## 2018-06-18 LAB — ECHOCARDIOGRAM COMPLETE
Height: 58 in
WEIGHTICAEL: 2761.92 [oz_av]

## 2018-06-18 LAB — HEPATIC FUNCTION PANEL
ALBUMIN: 2.6 g/dL — AB (ref 3.5–5.0)
ALK PHOS: 64 U/L (ref 38–126)
ALT: 283 U/L — AB (ref 0–44)
AST: 72 U/L — ABNORMAL HIGH (ref 15–41)
Bilirubin, Direct: 0.3 mg/dL — ABNORMAL HIGH (ref 0.0–0.2)
Indirect Bilirubin: 0.5 mg/dL (ref 0.3–0.9)
TOTAL PROTEIN: 5.5 g/dL — AB (ref 6.5–8.1)
Total Bilirubin: 0.8 mg/dL (ref 0.3–1.2)

## 2018-06-18 LAB — BASIC METABOLIC PANEL
ANION GAP: 4 — AB (ref 5–15)
ANION GAP: 13 (ref 5–15)
BUN: 21 mg/dL — ABNORMAL HIGH (ref 6–20)
BUN: 26 mg/dL — ABNORMAL HIGH (ref 6–20)
CHLORIDE: 120 mmol/L — AB (ref 98–111)
CO2: 18 mmol/L — AB (ref 22–32)
CO2: 19 mmol/L — ABNORMAL LOW (ref 22–32)
Calcium: 5.4 mg/dL — CL (ref 8.9–10.3)
Calcium: 8.1 mg/dL — ABNORMAL LOW (ref 8.9–10.3)
Chloride: 108 mmol/L (ref 98–111)
Creatinine, Ser: 0.59 mg/dL (ref 0.44–1.00)
Creatinine, Ser: 0.99 mg/dL (ref 0.44–1.00)
GFR calc Af Amer: >60 mL/min (ref >60)
GFR calc Af Amer: >60 mL/min (ref >60)
GLUCOSE: 95 mg/dL (ref 70–99)
GLUCOSE: 151 mg/dL — AB (ref 70–99)
POTASSIUM: 2.9 mmol/L — AB (ref 3.5–5.1)
POTASSIUM: 4.6 mmol/L (ref 3.5–5.1)
Sodium: 142 mmol/L (ref 135–145)
Sodium: 140 mmol/L (ref 135–145)

## 2018-06-18 LAB — GLUCOSE, CAPILLARY
GLUCOSE-CAPILLARY: 153 mg/dL — AB (ref 70–99)
Glucose-Capillary: 115 mg/dL — ABNORMAL HIGH (ref 70–99)
Glucose-Capillary: 96 mg/dL (ref 70–99)
Glucose-Capillary: 121 mg/dL — ABNORMAL HIGH (ref 70–99)
Glucose-Capillary: 142 mg/dL — ABNORMAL HIGH (ref 70–99)

## 2018-06-18 LAB — TSH: TSH: 0.101 u[IU]/mL — AB (ref 0.350–4.500)

## 2018-06-18 LAB — TROPONIN I

## 2018-06-18 LAB — T4, FREE: Free T4: 1.33 ng/dL (ref 0.82–1.77)

## 2018-06-18 LAB — MRSA PCR SCREENING: MRSA BY PCR: NEGATIVE

## 2018-06-18 MED ORDER — FENTANYL CITRATE (PF) 100 MCG/2ML IJ SOLN
25.0000 ug | Freq: Once | INTRAMUSCULAR | Status: AC
  Administered 2018-06-18: 25 ug via INTRAVENOUS
  Filled 2018-06-18: qty 2

## 2018-06-18 MED ORDER — POTASSIUM CHLORIDE CRYS ER 20 MEQ PO TBCR
40.0000 meq | EXTENDED_RELEASE_TABLET | Freq: Once | ORAL | Status: AC
  Administered 2018-06-18: 40 meq via ORAL
  Filled 2018-06-18: qty 2

## 2018-06-18 MED ORDER — PANTOPRAZOLE SODIUM 40 MG PO TBEC
40.0000 mg | DELAYED_RELEASE_TABLET | Freq: Two times a day (BID) | ORAL | Status: DC
  Administered 2018-06-18 – 2018-06-19 (×3): 40 mg via ORAL
  Filled 2018-06-18 (×3): qty 1

## 2018-06-18 MED ORDER — DEXAMETHASONE SODIUM PHOSPHATE 4 MG/ML IJ SOLN: 4.0000 mg | Freq: Three times a day (TID) | INTRAMUSCULAR | Status: DC

## 2018-06-18 MED ORDER — DEXAMETHASONE 2 MG PO TABS
2.0000 mg | ORAL_TABLET | Freq: Two times a day (BID) | ORAL | Status: DC
  Administered 2018-06-19: 2 mg via ORAL
  Filled 2018-06-18: qty 1

## 2018-06-18 NOTE — Plan of Care (Signed)

## 2018-06-18 NOTE — Progress Notes (Signed)
  Echocardiogram 2D Echocardiogram has been performed.  Jasmine SkeenVijay  [name] 06/18/2018, 1:44 PM

## 2018-06-18 NOTE — Progress Notes (Signed)
Critical lab calcium 5.4 MD notified

## 2018-06-18 NOTE — Progress Notes (Signed)
Physical Therapy Treatment Patient Details Name: Jasmine DodgeJennifer Ramirez MRN: [id number] DOB: Jun 14, 1966 Today's Date: 06/18/2018    History of Present Illness Patient is a 52 y/o female presenting with an unwitnessed fall, likely to forehead due to noted bruising. MRI revealing cord contusion at C2 and C3 without canal hematoma or impingement. Admitted for hyperextension injury of cervical spine. Patient with a PMH significant for Down syndrome, dementia.    PT Comments    Patient received in bed - cervical collar in place; however, very loose - PT attempting to correct fit with patient reporting pain and discomfort with this (nursing calling [employer] for refitting). Patient continues to require assist for bed mobility, transfers and limited gait distances. Unsteady stepping pattern with max cueing provided, however limited carryover. Patient stating x2 she was "scared" with transfers but willing to participate with PT. Will continue to follow.    Follow Up Recommendations  SNF;Supervision/Assistance - 24 hour     Equipment Recommendations  (TBD)    Recommendations for Other Services OT consult     Precautions / Restrictions Precautions Precautions: Fall;Cervical Required Braces or Orthoses: Cervical Brace Cervical Brace: Hard collar;At all times([employer] coming to re-fit patient) Restrictions Weight Bearing Restrictions: No    Mobility  Bed Mobility Overal bed mobility: Needs Assistance Bed Mobility: Rolling;Supine to Sit Rolling: Min assist   Supine to sit: HOB elevated;Min assist     General bed mobility comments: max cueing for sequencoing; attempted to roll for peri-care with discomfort; continues to reuire Min A for trunk control  Transfers Overall transfer level: Needs assistance Equipment used: 2 person hand held assist Transfers: Sit to/from [employer]Stand;Stand Pivot Transfers Sit to Stand: Min assist;+2 physical assistance Stand pivot transfers: Min assist;+2 physical  assistance       General transfer comment: still slightly impulsive; unsteady with transfers requiring +2 HHA for safety and stability; patient stating she's "scared" when sitting EOB  Ambulation/Gait                 Stairs             Wheelchair Mobility    Modified Rankin (Stroke Patients Only)       Balance Overall balance assessment: Needs assistance Sitting-balance support: No upper extremity supported;Feet supported Sitting balance-Leahy Scale: Fair     Standing balance support: Bilateral upper extremity supported;During functional activity Standing balance-Leahy Scale: Poor                              Cognition Arousal/Alertness: Awake/alert Behavior During Therapy: Anxious;Impulsive Overall Cognitive Status: No family/caregiver present to determine baseline cognitive functioning                                 General Comments: history of cognitive impairments      Exercises      General Comments        Pertinent Vitals/Pain Pain Assessment: Faces Faces Pain Scale: Hurts a little bit Pain Location: neck with tightened cervical brace to improve fit Pain Descriptors / Indicators: Moaning;Grimacing Pain Intervention(s): Limited activity within patient's tolerance;Monitored during session    Home Living                      Prior Function            PT Goals (current goals can now be found in the care plan  section) Acute Rehab PT Goals Patient Stated Goal: none stated PT Goal Formulation: With patient Time For Goal Achievement: 06/28/18 Potential to Achieve Goals: Fair Progress towards PT goals: Progressing toward goals    Frequency    Min 3X/week      PT Plan Current plan remains appropriate    Co-evaluation              AM-PAC PT "6 Clicks" Daily Activity  Outcome Measure  Difficulty turning over in bed (including adjusting bedclothes, sheets and blankets)?: A  Little Difficulty moving from lying on back to sitting on the side of the bed? : Unable Difficulty sitting down on and standing up from a chair with arms (e.g., wheelchair, bedside commode, etc,.)?: Unable Help needed moving to and from a bed to chair (including a wheelchair)?: A Lot Help needed walking in hospital room?: A Lot Help needed climbing 3-5 steps with a railing? : Total 6 Click Score: 10    End of Session Equipment Utilized During Treatment: Gait belt;Cervical collar Activity Tolerance: Patient tolerated treatment well Patient left: in bed;with call bell/phone within reach;with bed alarm set;with family/visitor present Nurse Communication: Mobility status PT Visit Diagnosis: Unsteadiness on feet (R26.81);Other abnormalities of gait and mobility (R26.89);Muscle weakness (generalized) (M62.81);History of falling (Z91.81)     Time: 1610-9604 PT Time Calculation (min) (ACUTE ONLY): 26 min  Charges:  $Therapeutic Activity: 23-37 mins                     Kipp Laurence, PT, DPT 06/18/18 11:11 AM Pager: [phone]

## 2018-06-18 NOTE — Plan of Care (Signed)
  Problem: Education: Goal: Knowledge of General Education information will improve Description Including pain rating scale, medication(s)/side effects and non-pharmacologic comfort measures 06/18/2018 0442 by Barnabas HarriesPoudel, Elesa Garman L, RN Outcome: Progressing 06/18/2018 0442 by Barnabas HarriesPoudel, Peter Daquila L, RN Outcome: Progressing   Problem: Health Behavior/Discharge Planning: Goal: Ability to manage health-related needs will improve 06/18/2018 0442 by Barnabas HarriesPoudel, Kinsleigh Ludolph L, RN Outcome: Progressing 06/18/2018 0442 by Barnabas HarriesPoudel, Caliann Leckrone L, RN Outcome: Progressing   Problem: Clinical Measurements: Goal: Ability to maintain clinical measurements within normal limits will improve 06/18/2018 0442 by Barnabas HarriesPoudel, Hershal Eriksson L, RN Outcome: Progressing 06/18/2018 0442 by Barnabas HarriesPoudel, Levenia Skalicky L, RN Outcome: Progressing Goal: Will remain free from infection 06/18/2018 0442 by Barnabas HarriesPoudel, Janel Beane L, RN Outcome: Progressing 06/18/2018 0442 by Barnabas HarriesPoudel, Calandra Madura L, RN Outcome: Progressing Goal: Diagnostic test results will improve 06/18/2018 0442 by Barnabas HarriesPoudel, Lonzie Simmer L, RN Outcome: Progressing 06/18/2018 0442 by Barnabas HarriesPoudel, Gabrille Kilbride L, RN Outcome: Progressing Goal: Respiratory complications will improve 06/18/2018 0442 by Barnabas HarriesPoudel, Korinna Tat L, RN Outcome: Progressing 06/18/2018 0442 by Barnabas HarriesPoudel, Demaria Deeney L, RN Outcome: Progressing Goal: Cardiovascular complication will be avoided 06/18/2018 0442 by Barnabas HarriesPoudel, Desarai Barrack L, RN Outcome: Progressing 06/18/2018 0442 by Barnabas HarriesPoudel, Remington Skalsky L, RN Outcome: Progressing   Problem: Activity: Goal: Risk for activity intolerance will decrease 06/18/2018 0442 by Barnabas HarriesPoudel, Mckenze Slone L, RN Outcome: Progressing 06/18/2018 0442 by Barnabas HarriesPoudel, Rasheeda Mulvehill L, RN Outcome: Progressing   Problem: Nutrition: Goal: Adequate nutrition will be maintained 06/18/2018 0442 by Barnabas HarriesPoudel, Veola Cafaro L, RN Outcome: Progressing 06/18/2018 0442 by Barnabas HarriesPoudel, Jenasis Straley L, RN Outcome: Progressing   Problem: Coping: Goal: Level of anxiety will decrease 06/18/2018 0442 by Barnabas HarriesPoudel, Briannah Lona L, RN Outcome: Progressing 06/18/2018 0442 by Barnabas HarriesPoudel,  Trysten Berti L, RN Outcome: Progressing   Problem: Elimination: Goal: Will not experience complications related to bowel motility 06/18/2018 0442 by Barnabas HarriesPoudel, Leisa Gault L, RN Outcome: Progressing 06/18/2018 0442 by Barnabas HarriesPoudel, Delcie Ruppert L, RN Outcome: Progressing Goal: Will not experience complications related to urinary retention 06/18/2018 0442 by Barnabas HarriesPoudel, Erik Burkett L, RN Outcome: Progressing 06/18/2018 0442 by Barnabas HarriesPoudel, Gissela Bloch L, RN Outcome: Progressing   Problem: Pain Managment: Goal: General experience of comfort will improve 06/18/2018 0442 by Barnabas HarriesPoudel, Clyda Smyth L, RN Outcome: Progressing 06/18/2018 0442 by Barnabas HarriesPoudel, Tyvon Eggenberger L, RN Outcome: Progressing   Problem: Safety: Goal: Ability to remain free from injury will improve 06/18/2018 0442 by Barnabas HarriesPoudel, Luisdaniel Kenton L, RN Outcome: Progressing 06/18/2018 0442 by Barnabas HarriesPoudel, Sheng Pritz L, RN Outcome: Progressing   Problem: Skin Integrity: Goal: Risk for impaired skin integrity will decrease 06/18/2018 0442 by Barnabas HarriesPoudel, Krisha Beegle L, RN Outcome: Progressing 06/18/2018 0442 by Barnabas HarriesPoudel, Sanjuan Sawa L, RN Outcome: Progressing

## 2018-06-18 NOTE — Progress Notes (Signed)
PROGRESS NOTE    Corah Willeford  ZOX:[id number] DOB: 10/01/1966 DOA: 06/11/2018 PCP: Lucretia Field   Brief Narrative: 52 year old female with history of dementia, Down syndrome, presented to the hospital with an unwitnessed fall in her group home.  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  Neurosurgery consulted, recommend decadron 2 weeks taper.  and C collar.  check CBG while on decadron,PRN SSI  Change decadron to 4 mg Q 8 hours.   Down syndrome with Alzheimer's;  At baseline.   Hypothyroidism; continue with synthroid.  TSH low but Free T 4 normal.   AKI; improved with IV fluids.   Hypocalcemia; 5.4 lab error, repeated at 8.1.   Transaminases;  Unclear etiology. Repeat in am.  She has not received significant amount of tylenol.  Will change decadron to oral and decrease dose.   Bradycardia;  Electrolytes corrected.  Troponin negative.  EKG with some ST elevation. ECHO no wall motion abnormalities. Normal EF.  Repeat EKG.    DVT prophylaxis: SCD Code Status: Full code.  Family Communication: multiples family member at bedside.  Disposition Plan: SNF, awaiting  SNF   Consultants:  Neurosurgery   Procedures: none  Antimicrobials:   none   Subjective: Lying down in bed. answer yes, to questions. Report pain.    Objective: Vitals:   06/18/18 0225 06/18/18 0509 06/18/18 0827 06/18/18 1125  BP: (!) 113/59 127/77 129/63 132/70  Pulse: (!) 40 (!) 46  (!) 58 (!) 53  Resp:  18 17 16   Temp: 98.2 F (36.8 C) 97.7 F (36.5 C) 97.9 F (36.6 C) 97.9 F (36.6 C)  TempSrc: Axillary Oral  Oral  SpO2: 91% 100% 99% 100%  Weight:      Height:        Intake/Output Summary (Last 24 hours) at 06/18/2018 1553 Last data filed at 06/18/2018 0500 Gross per 24 hour  Intake 1509.95 ml  Output -  Net 1509.95 ml   Filed Weights   06/12/18 0434  Weight: 78.3 kg (172 lb 9.9 oz)    Examination:  General exam:  NAD, neck collar in place.  Respiratory system: CTA Cardiovascular system: S 1, S 2 RRR Gastrointestinal system: BS present, soft, nt Central nervous system: Alert.  Extremities: Symmetric power.  Skin: no rashes.     Data Reviewed: I have personally reviewed following labs and imaging studies  CBC: No results for input(s): WBC, NEUTROABS, HGB, HCT, MCV, PLT in the last 168 hours. Basic Metabolic Panel: Recent Labs  Lab 06/11/18 2030 06/16/18 1241 06/17/18 0520 06/18/18 0936 06/18/18 1404  NA  --  138 139 142 140  K  --  3.8 4.4 2.9* 4.6  CL  --  104 106 120* 108  CO2  --  25 25 18* 19*  GLUCOSE  --  187* 118* 95 151*  BUN  --  28* 26* 21* 26*  CREATININE  --  1.10* 0.83 0.59 0.99  CALCIUM  --  8.2* 8.2* 5.4* 8.1*  MG 2.0  --   --   --   --    GFR: Estimated Creatinine Clearance: 58 mL/min (by C-G formula based on SCr of 0.99 mg/dL). Liver Function Tests: Recent Labs  Lab 06/18/18 1404  AST 72*  ALT 283*  ALKPHOS 64  BILITOT 0.8  PROT 5.5*  ALBUMIN 2.6*   No results for input(s): LIPASE, AMYLASE in the last 168 hours. No results for input(s): AMMONIA in the last 168 hours. Coagulation Profile: No results for input(s): INR, PROTIME in the last 168 hours. Cardiac Enzymes: Recent Labs  Lab 06/18/18 0434 06/18/18 1404  TROPONINI <0.03 <0.03   BNP (last 3 results) No results for input(s): PROBNP in the last 8760 hours. HbA1C: No results for input(s): HGBA1C in the last 72 hours. CBG: Recent Labs  Lab  06/17/18 0901 06/17/18 1107 06/17/18 1646 06/18/18 0624 06/18/18 1118  GLUCAP 135* 93 138* 115* 96   Lipid Profile: No results for input(s): CHOL, HDL, LDLCALC, TRIG, CHOLHDL, LDLDIRECT in the last 72 hours. Thyroid Function Tests: Recent Labs    06/18/18 0434 06/18/18 0936  TSH 0.101*  --   FREET4  --  1.33   Anemia Panel: No results for input(s): VITAMINB12, FOLATE, FERRITIN, TIBC, IRON, RETICCTPCT in the last 72 hours. Sepsis Labs: No results for input(s): PROCALCITON, LATICACIDVEN in the last 168 hours.  No results found for this or any previous visit (from the past 240 hour(s)).       Radiology Studies: No results found.      Scheduled Meds: . acetaminophen  650 mg Oral Once  . calcium-vitamin D  2 tablet Oral Daily  . cholecalciferol  2,000 Units Oral Daily  . dexamethasone  4 mg Intravenous Q8H  . insulin aspart  0-9 Units Subcutaneous TID WC  . levothyroxine  25 mcg Oral QAC breakfast  . pantoprazole  40 mg Oral BID   Continuous Infusions: . sodium chloride 75 mL/hr at 06/17/18 0855     LOS: 7 days    Time spent: 35 minutes.     Alba CoryBelkys A Krizia Flight, MD [facility] Pager [phone]  If 7PM-7AM, please contact night-coverage [email or web address] Password [email or web address] 06/18/2018, 3:53 PM

## 2018-06-18 NOTE — Progress Notes (Signed)
Patient's   HR keeps dropping down to 32 and stay sustain between 32-41, but she is asymptomatics, following  [place], VSs is WNL except HR, Oncall Physician is notified, & will continue to monitor closely.

## 2018-06-19 DIAGNOSIS — S14109D Unspecified injury at unspecified level of cervical spinal cord, subsequent encounter: Secondary | ICD-10-CM | POA: Diagnosis not present

## 2018-06-19 DIAGNOSIS — W19XXXS Unspecified fall, sequela: Secondary | ICD-10-CM | POA: Diagnosis not present

## 2018-06-19 DIAGNOSIS — B356 Tinea cruris: Secondary | ICD-10-CM | POA: Diagnosis not present

## 2018-06-19 DIAGNOSIS — M6281 Muscle weakness (generalized): Secondary | ICD-10-CM | POA: Diagnosis not present

## 2018-06-19 DIAGNOSIS — E039 Hypothyroidism, unspecified: Secondary | ICD-10-CM | POA: Diagnosis not present

## 2018-06-19 DIAGNOSIS — S0083XA Contusion of other part of head, initial encounter: Secondary | ICD-10-CM | POA: Diagnosis not present

## 2018-06-19 DIAGNOSIS — M79672 Pain in left foot: Secondary | ICD-10-CM | POA: Diagnosis not present

## 2018-06-19 DIAGNOSIS — S13131S Dislocation of C2/C3 cervical vertebrae, sequela: Secondary | ICD-10-CM | POA: Diagnosis not present

## 2018-06-19 DIAGNOSIS — L97429 Non-pressure chronic ulcer of left heel and midfoot with unspecified severity: Secondary | ICD-10-CM | POA: Diagnosis not present

## 2018-06-19 DIAGNOSIS — S1980XA Other specified injuries of unspecified part of neck, initial encounter: Secondary | ICD-10-CM | POA: Diagnosis not present

## 2018-06-19 DIAGNOSIS — W19XXXD Unspecified fall, subsequent encounter: Secondary | ICD-10-CM | POA: Diagnosis not present

## 2018-06-19 DIAGNOSIS — S14102A Unspecified injury at C2 level of cervical spinal cord, initial encounter: Secondary | ICD-10-CM | POA: Diagnosis not present

## 2018-06-19 DIAGNOSIS — G300 Alzheimer's disease with early onset: Secondary | ICD-10-CM | POA: Diagnosis not present

## 2018-06-19 DIAGNOSIS — F0390 Unspecified dementia without behavioral disturbance: Secondary | ICD-10-CM | POA: Diagnosis not present

## 2018-06-19 DIAGNOSIS — F79 Unspecified intellectual disabilities: Secondary | ICD-10-CM | POA: Diagnosis not present

## 2018-06-19 DIAGNOSIS — S14102D Unspecified injury at C2 level of cervical spinal cord, subsequent encounter: Secondary | ICD-10-CM | POA: Diagnosis not present

## 2018-06-19 DIAGNOSIS — N179 Acute kidney failure, unspecified: Secondary | ICD-10-CM | POA: Diagnosis not present

## 2018-06-19 DIAGNOSIS — R488 Other symbolic dysfunctions: Secondary | ICD-10-CM | POA: Diagnosis not present

## 2018-06-19 DIAGNOSIS — D72829 Elevated white blood cell count, unspecified: Secondary | ICD-10-CM | POA: Diagnosis not present

## 2018-06-19 DIAGNOSIS — G309 Alzheimer's disease, unspecified: Secondary | ICD-10-CM | POA: Diagnosis not present

## 2018-06-19 DIAGNOSIS — M542 Cervicalgia: Secondary | ICD-10-CM | POA: Diagnosis not present

## 2018-06-19 DIAGNOSIS — Z7401 Bed confinement status: Secondary | ICD-10-CM | POA: Diagnosis not present

## 2018-06-19 DIAGNOSIS — L03116 Cellulitis of left lower limb: Secondary | ICD-10-CM | POA: Diagnosis not present

## 2018-06-19 DIAGNOSIS — W19XXXA Unspecified fall, initial encounter: Secondary | ICD-10-CM | POA: Diagnosis not present

## 2018-06-19 DIAGNOSIS — R5383 Other fatigue: Secondary | ICD-10-CM | POA: Diagnosis not present

## 2018-06-19 DIAGNOSIS — R609 Edema, unspecified: Secondary | ICD-10-CM | POA: Diagnosis not present

## 2018-06-19 DIAGNOSIS — R2681 Unsteadiness on feet: Secondary | ICD-10-CM | POA: Diagnosis not present

## 2018-06-19 DIAGNOSIS — K219 Gastro-esophageal reflux disease without esophagitis: Secondary | ICD-10-CM | POA: Diagnosis not present

## 2018-06-19 DIAGNOSIS — F0280 Dementia in other diseases classified elsewhere without behavioral disturbance: Secondary | ICD-10-CM | POA: Diagnosis not present

## 2018-06-19 DIAGNOSIS — H9190 Unspecified hearing loss, unspecified ear: Secondary | ICD-10-CM | POA: Diagnosis not present

## 2018-06-19 DIAGNOSIS — R531 Weakness: Secondary | ICD-10-CM | POA: Diagnosis not present

## 2018-06-19 DIAGNOSIS — M2550 Pain in unspecified joint: Secondary | ICD-10-CM | POA: Diagnosis not present

## 2018-06-19 DIAGNOSIS — H6120 Impacted cerumen, unspecified ear: Secondary | ICD-10-CM | POA: Diagnosis not present

## 2018-06-19 DIAGNOSIS — R740 Nonspecific elevation of levels of transaminase and lactic acid dehydrogenase [LDH]: Secondary | ICD-10-CM | POA: Diagnosis not present

## 2018-06-19 DIAGNOSIS — F419 Anxiety disorder, unspecified: Secondary | ICD-10-CM | POA: Diagnosis not present

## 2018-06-19 DIAGNOSIS — R6250 Unspecified lack of expected normal physiological development in childhood: Secondary | ICD-10-CM | POA: Diagnosis not present

## 2018-06-19 DIAGNOSIS — Z9181 History of falling: Secondary | ICD-10-CM | POA: Diagnosis not present

## 2018-06-19 DIAGNOSIS — Z79890 Hormone replacement therapy (postmenopausal): Secondary | ICD-10-CM | POA: Diagnosis not present

## 2018-06-19 DIAGNOSIS — G4700 Insomnia, unspecified: Secondary | ICD-10-CM | POA: Diagnosis not present

## 2018-06-19 DIAGNOSIS — R451 Restlessness and agitation: Secondary | ICD-10-CM | POA: Diagnosis not present

## 2018-06-19 DIAGNOSIS — S90822D Blister (nonthermal), left foot, subsequent encounter: Secondary | ICD-10-CM | POA: Diagnosis not present

## 2018-06-19 DIAGNOSIS — E785 Hyperlipidemia, unspecified: Secondary | ICD-10-CM | POA: Diagnosis not present

## 2018-06-19 DIAGNOSIS — H5210 Myopia, unspecified eye: Secondary | ICD-10-CM | POA: Diagnosis not present

## 2018-06-19 DIAGNOSIS — Q909 Down syndrome, unspecified: Secondary | ICD-10-CM | POA: Diagnosis not present

## 2018-06-19 LAB — GLUCOSE, CAPILLARY
GLUCOSE-CAPILLARY: 96 mg/dL (ref 70–99)
GLUCOSE-CAPILLARY: 80 mg/dL (ref 70–99)

## 2018-06-19 LAB — COMPREHENSIVE METABOLIC PANEL
ALT: 208 U/L — ABNORMAL HIGH (ref 0–44)
ANION GAP: 12 (ref 5–15)
AST: 42 U/L — ABNORMAL HIGH (ref 15–41)
Albumin: 2.6 g/dL — ABNORMAL LOW (ref 3.5–5.0)
Alkaline Phosphatase: 59 U/L (ref 38–126)
BILIRUBIN TOTAL: 1 mg/dL (ref 0.3–1.2)
BUN: 29 mg/dL — AB (ref 6–20)
CO2: 20 mmol/L — ABNORMAL LOW (ref 22–32)
Calcium: 7.8 mg/dL — ABNORMAL LOW (ref 8.9–10.3)
Chloride: 107 mmol/L (ref 98–111)
Creatinine, Ser: 0.94 mg/dL (ref 0.44–1.00)
GFR calc non Af Amer: >60 mL/min (ref >60)
Glucose, Bld: 102 mg/dL — ABNORMAL HIGH (ref 70–99)
POTASSIUM: 4.5 mmol/L (ref 3.5–5.1)
Sodium: 139 mmol/L (ref 135–145)
TOTAL PROTEIN: 5.2 g/dL — AB (ref 6.5–8.1)

## 2018-06-19 LAB — T3, FREE: T3, Free: 1.1 pg/mL — ABNORMAL LOW (ref 2.0–4.4)

## 2018-06-19 MED ORDER — TRAMADOL HCL 50 MG PO TABS: 25.0000 mg | ORAL_TABLET | Freq: Two times a day (BID) | ORAL | 0 refills | Status: AC | PRN

## 2018-06-19 MED ORDER — DEXAMETHASONE 4 MG PO TABS: ORAL_TABLET | ORAL | 0 refills | Status: DC

## 2018-06-19 MED ORDER — DEXAMETHASONE 2 MG PO TABS: ORAL_TABLET | ORAL | 0 refills | Status: DC

## 2018-06-19 MED ORDER — POLYETHYLENE GLYCOL 3350 17 G PO PACK: 17.0000 g | PACK | Freq: Every day | ORAL | 0 refills | Status: DC | PRN

## 2018-06-19 MED ORDER — PANTOPRAZOLE SODIUM 40 MG PO TBEC: 40.0000 mg | DELAYED_RELEASE_TABLET | Freq: Every day | ORAL | 0 refills | Status: DC

## 2018-06-19 MED ORDER — DEXAMETHASONE 4 MG PO TABS: 4.0000 mg | ORAL_TABLET | Freq: Two times a day (BID) | ORAL | Status: DC

## 2018-06-19 NOTE — Clinical Social Work Note (Signed)
Clinical Social Worker facilitated patient discharge including contacting patient family and facility to confirm patient discharge plans.  Clinical information faxed to facility and family agreeable with plan.  CSW arranged ambulance transport via [facility].  RN to call [phone] for report prior to discharge.  Clinical Social Worker will sign off for now as social work intervention is no longer needed. Please consult us again if new need arises.  Velora MediateBridget Iram Lundberg, MSW [phone]

## 2018-06-19 NOTE — Discharge Summary (Addendum)
Physician Discharge Summary  Jasmine Ramirez ZOX:[id number] DOB: 1966/01/15 DOA: 06/11/2018  PCP: Lucretia Field  Admit date: 06/11/2018 Discharge date: 06/19/2018  Admitted From: Group home Disposition:  SNF  Recommendations for Outpatient Follow-up:  1. Follow up with PCP in 1-2 weeks 2. Please obtain BMP/CBC in one week 3. Please check LFT on Monday. To follow trend. Further evaluation depending on results.  4. Needs Thyroid function test in 2 weeks.  5. Needs to follow up with neurosurgeon in 2 weeks.      Discharge Condition: Stable.  CODE STATUS: Full code.  Diet recommendation: Heart Healthy  Brief/Interim Summary: Brief Narrative: 52 year old female with history of dementia, Down syndrome, presented to the hospital with an unwitnessed fall in her group home. 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  Neurosurgery consulted, recommend C collar.  check CBG while on decadron,PRN SSI  Change decadron to 2 mg BID.  Discussed with Dr Danielle Dess, ok to wean decadron faster due to elevation of liver enzymes. I will change decadron to 2 mg BID for one day, then 2 mg for one day, then stop.   Down syndrome with Alzheimer's;  At baseline.   Hypothyroidism; continue with synthroid.  TSH low but Free T 4 normal.  needs repeat Thyroid function test   AKI; improved with IV fluids.   Hypocalcemia; 5.4 lab error, repeated at 8.1.   Transaminases;  Unclear etiology.  Trending down.  She has not received significant amount of tylenol.  Will change decadron to oral and decrease dose.  Needs repeat labs on Monday, further evaluation depending on results.   Bradycardia;  Electrolytes corrected.  Troponin negative.  EKG with some ST elevation. ECHO no wall motion abnormalities. Normal EF.  Repeated  EKG. no significant ST elevation.  Sinus pauses, in setting of possible sleep apnea. Needs out patient study   Discharge Diagnoses:  Principal Problem:   Hyperextension injury of cervical spine, initial encounter Active Problems:   Down syndrome   Alzheimer's disease with early onset   Spinal cord injury to cervical region without bone injury [facility])    Discharge Instructions  Discharge Instructions    Diet - low sodium heart healthy   Complete by:  As directed    Increase activity slowly   Complete by:  As directed      Allergies as of 06/19/2018   No Known Allergies     Medication List    TAKE these medications   CALCIUM 500/D PO Take 500 mg by mouth daily.   dexamethasone 2 MG tablet Commonly known as:  DECADRON Take one tablet twice a day for one day then one tablet  A day for one day.   levothyroxine 25 MCG tablet Commonly known as:  SYNTHROID, LEVOTHROID Take 25 mcg by mouth daily before breakfast.   Melatonin 3 MG Tabs Take 3 mg by mouth at bedtime.   MULTIVITAMIN PO Take by mouth.   pantoprazole 40 MG tablet  Commonly known as:  PROTONIX Take 1 tablet (40 mg total) by mouth daily.   polyethylene glycol packet Commonly known as:  MIRALAX / GLYCOLAX Take 17 g by mouth daily as needed for mild constipation.   traMADol 50 MG tablet Commonly known as:  ULTRAM Take 0.5 tablets (25 mg total) by mouth every 12 (twelve) hours as needed for up to 3 days.   Vitamin D3 2000 units Tabs Take 2,000 Units by mouth daily.       Contact information for follow-up providers    Donalee Citrin, MD. Schedule an appointment as soon as  possible for a visit in 2 week(s).   Specialty:  Neurosurgery Contact information: [address] [place] [address] [phone]            Contact information for after-discharge care    Destination    [facility] Preferred SNF .   Service:  Skilled Nursing Contact information: [address] [place] [address] [phone]                 No Known Allergies  Consultations:  Neurosurgery    Procedures/Studies: Dg Chest 2 View  Result Date: 06/11/2018 CLINICAL DATA:  Altered mental status.  Recent fall. EXAM: CHEST - 2 VIEW COMPARISON:  None. FINDINGS: There is mild left base atelectasis. No edema or consolidation. Heart size and pulmonary vascularity are normal. No adenopathy. No pneumothorax. There is a bone island in the right proximal humerus. There is a probable bone infarct in the proximal left humerus. IMPRESSION: Slight atelectasis left base. No edema or consolidation. Heart size within normal limits. No pneumothorax evident. Electronically Signed   By: Bretta Bang III M.D.   On: 06/11/2018 15:05   Ct Head Wo Contrast  Result Date: 06/11/2018 CLINICAL DATA:  Altered mental status.  Fall this morning. EXAM: CT HEAD WITHOUT CONTRAST CT CERVICAL SPINE WITHOUT CONTRAST TECHNIQUE: Multidetector CT imaging of the head and cervical spine was performed following the standard protocol without intravenous contrast. Multiplanar CT image reconstructions of the cervical spine were also generated. COMPARISON:  None. FINDINGS: Examination is limited by motion artifact. CT HEAD FINDINGS Brain: No evidence of acute infarction, hemorrhage, hydrocephalus, extra-axial collection or mass lesion/mass effect. Mild generalized cerebral atrophy, advanced for age. Vascular: No hyperdense vessel or unexpected calcification. Skull: Normal. Negative for fracture or focal lesion. Sinuses/Orbits: No acute finding.  Underdeveloped mastoid air  cells. Other: Small right frontal scalp hematoma. CT CERVICAL SPINE FINDINGS Alignment: Degenerative trace retrolisthesis at C2-C3 and trace anterolisthesis at C4-C5. No traumatic malalignment. Skull base and vertebrae: No acute fracture. No primary bone lesion or focal pathologic process. Soft tissues and spinal canal: No prevertebral fluid or swelling. No visible canal hematoma. Disc levels: Moderate degenerative disc disease and uncovertebral hypertrophy at C2-C3 and from C5-C6 through C7-T1. Moderate left-sided facet arthropathy throughout the cervical spine. Upper chest: Negative. Other: None. IMPRESSION: 1. Motion limited exam. No definite acute intracranial abnormality. Small right frontal scalp hematoma. 2. No definite acute cervical spine fracture. Moderate degenerative changes of the cervical spine. Electronically Signed   By: Obie Dredge M.D.   On: 06/11/2018 12:42   Ct Cervical Spine Wo Contrast  Result Date: 06/11/2018 CLINICAL DATA:  Altered mental status.  Fall this morning. EXAM: CT HEAD WITHOUT CONTRAST CT CERVICAL SPINE WITHOUT CONTRAST TECHNIQUE: Multidetector CT imaging of the head and cervical spine was performed following the standard protocol without intravenous contrast. Multiplanar CT image reconstructions of the  cervical spine were also generated. COMPARISON:  None. FINDINGS: Examination is limited by motion artifact. CT HEAD FINDINGS Brain: No evidence of acute infarction, hemorrhage, hydrocephalus, extra-axial collection or mass lesion/mass effect. Mild generalized cerebral atrophy, advanced for age. Vascular: No hyperdense vessel or unexpected calcification. Skull: Normal. Negative for fracture or focal lesion. Sinuses/Orbits: No acute finding.  Underdeveloped mastoid air cells. Other: Small right frontal scalp hematoma. CT CERVICAL SPINE FINDINGS Alignment: Degenerative trace retrolisthesis at C2-C3 and trace anterolisthesis at C4-C5. No traumatic malalignment. Skull base and  vertebrae: No acute fracture. No primary bone lesion or focal pathologic process. Soft tissues and spinal canal: No prevertebral fluid or swelling. No visible canal hematoma. Disc levels: Moderate degenerative disc disease and uncovertebral hypertrophy at C2-C3 and from C5-C6 through C7-T1. Moderate left-sided facet arthropathy throughout the cervical spine. Upper chest: Negative. Other: None. IMPRESSION: 1. Motion limited exam. No definite acute intracranial abnormality. Small right frontal scalp hematoma. 2. No definite acute cervical spine fracture. Moderate degenerative changes of the cervical spine. Electronically Signed   By: Obie Dredge M.D.   On: 06/11/2018 12:42   Mr Brain Wo Contrast  Result Date: 06/11/2018 CLINICAL DATA:  Initial evaluation for neuro decline, head trauma. EXAM: MRI HEAD WITHOUT CONTRAST TECHNIQUE: Multiplanar, multiecho pulse sequences of the brain and surrounding structures were obtained without intravenous contrast. COMPARISON:  Prior CT from earlier the same day. FINDINGS: Brain: Examination technically limited as the patient was unable to tolerate the full length of the exam. Axial DWI and T2 weighted sequences, with sagittal T1 weighted sequence were performed. Additionally, images provided are degraded by motion. Diffusion-weighted imaging demonstrates no evidence for acute or subacute ischemia. Cerebral volume grossly within normal limits for age. No definite encephalomalacia to suggest chronic infarction. No appreciable mass lesion. No midline shift or mass effect. No hydrocephalus. No extra-axial fluid collection. Vascular: Major intracranial vascular flow voids are grossly maintained at the skull base. Skull and upper cervical spine: Craniocervical junction demonstrates no acute finding. Degree of basilar invagination related to down syndrome suspected. Bone marrow signal intensity within normal limits. No scalp soft tissue abnormality. Sinuses/Orbits: Globes and  orbital soft tissues demonstrate no obvious abnormality. Paranasal sinuses grossly clear. Other: None. IMPRESSION: 1. Limited study due to patient's inability to tolerate the full length of the exam and motion artifact. 2. No definite acute intracranial abnormality. Electronically Signed   By: Rise Mu M.D.   On: 06/11/2018 18:40   Mr Cervical Spine Wo Contrast  Result Date: 06/12/2018 CLINICAL DATA:  Cervical spine trauma with ligamentous injury suspected. Arm and leg weakness after fall with forehead injury today. EXAM: MRI CERVICAL SPINE WITHOUT CONTRAST TECHNIQUE: Multiplanar, multisequence MR imaging of the cervical spine was performed. No intravenous contrast was administered. COMPARISON:  Cervical spine CT from yesterday FINDINGS: Alignment: C4-5 2 mm of anterolisthesis which is likely chronic and facet mediated. Slight anterolisthesis at C7-T1. No edema at this level. Vertebrae: No occult fracture is noted. Cord: Focal central and dorsal cord edema at C2 and C3 with mild expansion. Given the history this is most consistent with contusion. No blood products or canal collection. Posterior Fossa, vertebral arteries, paraspinal tissues: Prevertebral edema at C2 and C3. Intrinsic neck muscle edema at C2 and C3 dorsally The ligamentum flavum appears to be intact. No acute disc injury is seen. Disc levels: C2-3: Disc narrowing and endplate ridging. Left uncovertebral and facet spurring. Mild to moderate left foraminal narrowing C3-4: Unremarkable. C4-5: Facet spurring greater on the left. Mild  anterolisthesis. No herniation or impingement C5-6: Disc narrowing with endplate ridging. Patent canal and foramina C6-7: Disc narrowing with endplate ridging. Negative facets. No impingement C7-T1:Mild facet spurring on the left. Slight anterolisthesis. Disc narrowing and endplate degeneration. No impingement. These results were called by telephone at the time of interpretation on 06/12/2018 at 1:19 pm to Dr.  Elvera Lennox , who verbally acknowledged these results. IMPRESSION: 1. Cord contusion at C2 and C3 without blood products. No canal hematoma or impingement. 2. Prevertebral edema and posterior muscular strain at C2 and C3. No major ligamentous disruption noted. No occult fracture. 3. Degenerative changes described above. Electronically Signed   By: Marnee Spring M.D.   On: 06/12/2018 13:32      Subjective: Mild neck pain   Discharge Exam: Vitals:   06/19/18 0720 06/19/18 1135  BP: 127/65 100/86  Pulse: (!) 55 73  Resp: 17 16  Temp: 98.2 F (36.8 C) 97.8 F (36.6 C)  SpO2: 99% 100%   Vitals:   06/18/18 2358 06/19/18 0354 06/19/18 0720 06/19/18 1135  BP: (!) 121/55 (!) 103/57 127/65 100/86  Pulse: (!) 43 (!) 41 (!) 55 73  Resp: 16 14 17 16   Temp: 97.6 F (36.4 C) 98 F (36.7 C) 98.2 F (36.8 C) 97.8 F (36.6 C)  TempSrc: Oral Oral Oral Oral  SpO2: 98% 99% 99% 100%  Weight:      Height:        General: Pt is alert, awake, not in acute distress Cardiovascular: RRR, S1/S2 +, no rubs, no gallops Respiratory: CTA bilaterally, no wheezing, no rhonchi Abdominal: Soft, NT, ND, bowel sounds + Extremities: no edema, no cyanosis    The results of significant diagnostics from this hospitalization (including imaging, microbiology, ancillary and laboratory) are listed below for reference.     Microbiology: Recent Results (from the past 240 hour(s))  MRSA PCR Screening     Status: None   Collection Time: 06/18/18  8:27 PM  Result Value Ref Range Status   MRSA by PCR NEGATIVE NEGATIVE Final    Comment:        The GeneXpert MRSA Assay (FDA approved for NASAL specimens only), is one component of a comprehensive MRSA colonization surveillance program. It is not intended to diagnose MRSA infection nor to guide or monitor treatment for MRSA infections. Performed at [facility], [address] [place], [place] [address]      Labs: BNP (last 3 results) No results for  input(s): BNP in the last 8760 hours. Basic Metabolic Panel: Recent Labs  Lab 06/16/18 1241 06/17/18 0520 06/18/18 0936 06/18/18 1404 06/19/18 0857  NA 138 139 142 140 139  K 3.8 4.4 2.9* 4.6 4.5  CL 104 106 120* 108 107  CO2 25 25 18* 19* 20*  GLUCOSE 187* 118* 95 151* 102*  BUN 28* 26* 21* 26* 29*  CREATININE 1.10* 0.83 0.59 0.99 0.94  CALCIUM 8.2* 8.2* 5.4* 8.1* 7.8*   Liver Function Tests: Recent Labs  Lab 06/18/18 1404 06/19/18 0857  AST 72* 42*  ALT 283* 208*  ALKPHOS 64 59  BILITOT 0.8 1.0  PROT 5.5* 5.2*  ALBUMIN 2.6* 2.6*   No results for input(s): LIPASE, AMYLASE in the last 168 hours. No results for input(s): AMMONIA in the last 168 hours. CBC: No results for input(s): WBC, NEUTROABS, HGB, HCT, MCV, PLT in the last 168 hours. Cardiac Enzymes: Recent Labs  Lab 06/18/18 0434 06/18/18 1404  TROPONINI <0.03 <0.03   BNP: Invalid input(s):  POCBNP CBG: Recent Labs  Lab 06/18/18 1645 06/18/18 1814 06/18/18 2233 06/19/18 0609 06/19/18 1127  GLUCAP 121* 153* 142* 96 80   D-Dimer No results for input(s): DDIMER in the last 72 hours. Hgb A1c No results for input(s): HGBA1C in the last 72 hours. Lipid Profile No results for input(s): CHOL, HDL, LDLCALC, TRIG, CHOLHDL, LDLDIRECT in the last 72 hours. Thyroid function studies Recent Labs    06/18/18 0434 06/18/18 0936  TSH 0.101*  --   T3FREE  --  1.1*   Anemia work up No results for input(s): VITAMINB12, FOLATE, FERRITIN, TIBC, IRON, RETICCTPCT in the last 72 hours. Urinalysis    Component Value Date/Time   COLORURINE YELLOW 06/11/2018 1644   APPEARANCEUR CLEAR 06/11/2018 1644   LABSPEC 1.014 06/11/2018 1644   PHURINE 8.0 06/11/2018 1644   GLUCOSEU NEGATIVE 06/11/2018 1644   HGBUR NEGATIVE 06/11/2018 1644   BILIRUBINUR NEGATIVE 06/11/2018 1644   KETONESUR NEGATIVE 06/11/2018 1644   PROTEINUR NEGATIVE 06/11/2018 1644   NITRITE NEGATIVE 06/11/2018 1644   LEUKOCYTESUR NEGATIVE 06/11/2018  1644   Sepsis Labs Invalid input(s): PROCALCITONIN,  WBC,  LACTICIDVEN Microbiology Recent Results (from the past 240 hour(s))  MRSA PCR Screening     Status: None   Collection Time: 06/18/18  8:27 PM  Result Value Ref Range Status   MRSA by PCR NEGATIVE NEGATIVE Final    Comment:        The GeneXpert MRSA Assay (FDA approved for NASAL specimens only), is one component of a comprehensive MRSA colonization surveillance program. It is not intended to diagnose MRSA infection nor to guide or monitor treatment for MRSA infections. Performed at [facility], [address] [place], [place] [id number]      Time coordinating discharge: 35 minutes.   SIGNED:   Alba CoryBelkys A Kamyiah Colantonio, MD  [facility] 06/19/2018, 1:39 PM Pager   If 7PM-7AM, please contact night-coverage [email or web address] Password [email or web address]

## 2018-06-19 NOTE — Evaluation (Signed)
Occupational Therapy Evaluation Patient Details Name: Jasmine DodgeJennifer Bouza MRN: [id number] DOB: 1965/11/23 Today's Date: 06/19/2018    History of Present Illness Patient is a 52 y/o female presenting with an unwitnessed fall, likely to forehead due to noted bruising. MRI revealing cord contusion at C2 and C3 without canal hematoma or impingement. Admitted for hyperextension injury of cervical spine. Patient with a PMH significant for Down syndrome, dementia.   Clinical Impression   Pt admitted with fall and PMH of down syndrome and dementia. Pt currently with functional limitations due to the deficits listed below (see OT Problem List).  Pt will benefit from skilled OT to increase their safety and independence with ADL and functional mobility for ADL to facilitate discharge to venue listed below.      Follow Up Recommendations  SNF          Precautions / Restrictions Precautions Precautions: Fall;Cervical Required Braces or Orthoses: Cervical Brace Cervical Brace: Hard collar;At all times(biotech coming to re-fit patient) Restrictions Weight Bearing Restrictions: No      Mobility Bed Mobility Overal bed mobility: Needs Assistance Bed Mobility: Rolling Rolling: Min assist;Mod assist         General bed mobility comments: Pt rolled onto side but would not A with getting to in to sitting position.    Transfers                 General transfer comment: pt refused        ADL either performed or assessed with clinical judgement   ADL Overall ADL's : Needs assistance/impaired Eating/Feeding: Maximal assistance;Bed level   Grooming: Maximal assistance;Bed level                                 General ADL Comments: pt would not attempt to sit EOB with OT or get OOB     Vision Patient Visual Report: No change from baseline       Perception     Praxis      Pertinent Vitals/Pain Faces Pain Scale: Hurts a little bit Pain Location: neck Pain  Descriptors / Indicators: Grimacing Pain Intervention(s): Limited activity within patient's tolerance;Monitored during session     Hand Dominance     Extremity/Trunk Assessment Upper Extremity Assessment Upper Extremity Assessment: LUE deficits/detail;RUE deficits/detail RUE Deficits / Details: decreased AROM BUE. Pt not able to lift BUE against gravity to feed self or wash face.  Pt needed to to hold weight of arm in order for her to attempt to hold drink RUE Coordination: decreased fine motor;decreased gross motor LUE Deficits / Details: decreased AROM BUE. Pt not able to lift BUE against gravity to feed self or wash face.  Pt needed to to hold weight of arm in order for her to attempt to hold drin LUE Coordination: decreased gross motor;decreased fine motor           Communication Communication Communication: Expressive difficulties(answers most questions with "yes")   Cognition Arousal/Alertness: Awake/alert Behavior During Therapy: Anxious;Impulsive Overall Cognitive Status: Impaired/Different from baseline                                                Home Living Family/patient expects to be discharged to:: Group home(vs SNF)  Prior Functioning/Environment Level of Independence: Independent        Comments: per chart, ambulated without assistance        OT Problem List: Decreased strength;Decreased activity tolerance;Impaired balance (sitting and/or standing);Decreased safety awareness;Decreased knowledge of precautions;Decreased coordination;Decreased knowledge of use of DME or AE;Obesity;Impaired UE functional use      OT Treatment/Interventions: Self-care/ADL training;Patient/family education;DME and/or AE instruction    OT Goals(Current goals can be found in the care plan section) Acute Rehab OT Goals Patient Stated Goal: none stated OT Goal Formulation: With patient Time For Goal  Achievement: 06/26/18  OT Frequency: Min 2X/week              AM-PAC PT "6 Clicks" Daily Activity     Outcome Measure Help from another person eating meals?: Total Help from another person taking care of personal grooming?: Total Help from another person toileting, which includes using toliet, bedpan, or urinal?: Total Help from another person bathing (including washing, rinsing, drying)?: Total Help from another person to put on and taking off regular upper body clothing?: Total Help from another person to put on and taking off regular lower body clothing?: Total 6 Click Score: 6   End of Session Nurse Communication: Mobility status  Activity Tolerance: Other (comment)(pt not participative with OT this day.  Pt did attempt to hold her drink with OT) Patient left: in bed;with bed alarm set  OT Visit Diagnosis: Unsteadiness on feet (R26.81);History of falling (Z91.81);Other abnormalities of gait and mobility (R26.89);Muscle weakness (generalized) (M62.81);Cognitive communication deficit (R41.841)                Time: 1610-9604 OT Time Calculation (min): 18 min Charges:  OT General Charges $OT Visit: 1 Visit OT Evaluation $OT Eval Moderate Complexity: 1 [address] [place] [phone]  Alba Cory 06/19/2018, 3:19 PM

## 2018-06-19 NOTE — Progress Notes (Signed)
Notified by tele that pt had 2.47 sec pause...MD on call notified

## 2018-06-19 NOTE — Clinical Social Work Placement (Signed)
   CLINICAL SOCIAL WORK PLACEMENT  NOTE  Date:  06/19/2018  Patient Details  Name: Jasmine Ramirez MRN: [id number] Date of Birth: 10-13-1966  Clinical Social Work is seeking post-discharge placement for this patient at the Skilled  Nursing Facility level of care (*CSW will initial, date and re-position this form in  chart as items are completed):      Patient/family provided with [facility] Clinical Social Work Department's list of facilities offering this level of care within the geographic area requested by the patient (or if unable, by the patient's family).  Yes   Patient/family informed of their freedom to choose among providers that offer the needed level of care, that participate in Medicare, Medicaid or managed care program needed by the patient, have an available bed and are willing to accept the patient.      Patient/family informed of [facility]'s ownership interest in [facility] and [facility], as well as of the fact that they are under no obligation to receive care at these facilities.  PASRR submitted to EDS on       PASRR number received on 06/15/18     Existing PASRR number confirmed on       FL2 transmitted to all facilities in geographic area requested by pt/family on 06/15/18     FL2 transmitted to all facilities within larger geographic area on       Patient informed that his/her managed care company has contracts with or will negotiate with certain facilities, including the following:        Yes   Patient/family informed of bed offers received.  [name]     Physician recommends and patient chooses bed at      [name].  Patient to be transferred to facility by [facility]     Patient family notified on 06/19/18 of transfer.  Name of family member notified:  Nichole     PHYSICIAN Please prepare priority discharge summary, including medications, Please  prepare prescriptions     Additional Comment:    _______________________________________________ Maree KrabbeBridget A Miriya Cloer, LCSW 06/19/2018, 12:10 PM

## 2018-06-19 NOTE — Progress Notes (Signed)
Report called to  [facility]. S/W Alcario DroughtErica. All questions answered. Barbera Settersurner, Annalee Meyerhoff B RN

## 2018-06-21 DIAGNOSIS — G309 Alzheimer's disease, unspecified: Secondary | ICD-10-CM | POA: Diagnosis not present

## 2018-06-21 DIAGNOSIS — Q909 Down syndrome, unspecified: Secondary | ICD-10-CM | POA: Diagnosis not present

## 2018-06-21 DIAGNOSIS — R451 Restlessness and agitation: Secondary | ICD-10-CM | POA: Diagnosis not present

## 2018-06-21 DIAGNOSIS — F419 Anxiety disorder, unspecified: Secondary | ICD-10-CM | POA: Diagnosis not present

## 2018-06-22 DIAGNOSIS — F0390 Unspecified dementia without behavioral disturbance: Secondary | ICD-10-CM | POA: Diagnosis not present

## 2018-06-22 DIAGNOSIS — Q909 Down syndrome, unspecified: Secondary | ICD-10-CM | POA: Diagnosis not present

## 2018-06-22 DIAGNOSIS — R531 Weakness: Secondary | ICD-10-CM | POA: Diagnosis not present

## 2018-06-22 DIAGNOSIS — S14109D Unspecified injury at unspecified level of cervical spinal cord, subsequent encounter: Secondary | ICD-10-CM | POA: Diagnosis not present

## 2018-06-25 DIAGNOSIS — W19XXXD Unspecified fall, subsequent encounter: Secondary | ICD-10-CM | POA: Diagnosis not present

## 2018-06-25 DIAGNOSIS — G309 Alzheimer's disease, unspecified: Secondary | ICD-10-CM | POA: Diagnosis not present

## 2018-06-25 DIAGNOSIS — D72829 Elevated white blood cell count, unspecified: Secondary | ICD-10-CM | POA: Diagnosis not present

## 2018-06-25 DIAGNOSIS — Q909 Down syndrome, unspecified: Secondary | ICD-10-CM | POA: Diagnosis not present

## 2018-06-28 DIAGNOSIS — Q909 Down syndrome, unspecified: Secondary | ICD-10-CM | POA: Diagnosis not present

## 2018-06-28 DIAGNOSIS — G309 Alzheimer's disease, unspecified: Secondary | ICD-10-CM | POA: Diagnosis not present

## 2018-06-28 DIAGNOSIS — M6281 Muscle weakness (generalized): Secondary | ICD-10-CM | POA: Diagnosis not present

## 2018-06-28 DIAGNOSIS — W19XXXA Unspecified fall, initial encounter: Secondary | ICD-10-CM | POA: Diagnosis not present

## 2018-06-30 DIAGNOSIS — E785 Hyperlipidemia, unspecified: Secondary | ICD-10-CM | POA: Diagnosis not present

## 2018-06-30 DIAGNOSIS — G4700 Insomnia, unspecified: Secondary | ICD-10-CM | POA: Diagnosis not present

## 2018-06-30 DIAGNOSIS — E039 Hypothyroidism, unspecified: Secondary | ICD-10-CM | POA: Diagnosis not present

## 2018-06-30 DIAGNOSIS — H5210 Myopia, unspecified eye: Secondary | ICD-10-CM | POA: Diagnosis not present

## 2018-06-30 DIAGNOSIS — F79 Unspecified intellectual disabilities: Secondary | ICD-10-CM | POA: Diagnosis not present

## 2018-07-02 DIAGNOSIS — R5383 Other fatigue: Secondary | ICD-10-CM | POA: Diagnosis not present

## 2018-07-02 DIAGNOSIS — G309 Alzheimer's disease, unspecified: Secondary | ICD-10-CM | POA: Diagnosis not present

## 2018-07-02 DIAGNOSIS — Q909 Down syndrome, unspecified: Secondary | ICD-10-CM | POA: Diagnosis not present

## 2018-07-02 DIAGNOSIS — G4700 Insomnia, unspecified: Secondary | ICD-10-CM | POA: Diagnosis not present

## 2018-07-05 DIAGNOSIS — G309 Alzheimer's disease, unspecified: Secondary | ICD-10-CM | POA: Diagnosis not present

## 2018-07-05 DIAGNOSIS — R609 Edema, unspecified: Secondary | ICD-10-CM | POA: Diagnosis not present

## 2018-07-05 DIAGNOSIS — Q909 Down syndrome, unspecified: Secondary | ICD-10-CM | POA: Diagnosis not present

## 2018-07-05 DIAGNOSIS — R2681 Unsteadiness on feet: Secondary | ICD-10-CM | POA: Diagnosis not present

## 2018-07-06 DIAGNOSIS — M542 Cervicalgia: Secondary | ICD-10-CM | POA: Diagnosis not present

## 2018-07-08 DIAGNOSIS — Q909 Down syndrome, unspecified: Secondary | ICD-10-CM | POA: Diagnosis not present

## 2018-07-08 DIAGNOSIS — S90822D Blister (nonthermal), left foot, subsequent encounter: Secondary | ICD-10-CM | POA: Diagnosis not present

## 2018-07-08 DIAGNOSIS — R609 Edema, unspecified: Secondary | ICD-10-CM | POA: Diagnosis not present

## 2018-07-08 DIAGNOSIS — M6281 Muscle weakness (generalized): Secondary | ICD-10-CM | POA: Diagnosis not present

## 2018-07-08 DIAGNOSIS — G309 Alzheimer's disease, unspecified: Secondary | ICD-10-CM | POA: Diagnosis not present

## 2018-07-15 DIAGNOSIS — M79672 Pain in left foot: Secondary | ICD-10-CM | POA: Diagnosis not present

## 2018-07-15 DIAGNOSIS — S90822D Blister (nonthermal), left foot, subsequent encounter: Secondary | ICD-10-CM | POA: Diagnosis not present

## 2018-07-15 DIAGNOSIS — R609 Edema, unspecified: Secondary | ICD-10-CM | POA: Diagnosis not present

## 2018-07-15 DIAGNOSIS — L03116 Cellulitis of left lower limb: Secondary | ICD-10-CM | POA: Diagnosis not present

## 2018-07-16 DIAGNOSIS — M79672 Pain in left foot: Secondary | ICD-10-CM | POA: Diagnosis not present

## 2018-07-16 DIAGNOSIS — S90822D Blister (nonthermal), left foot, subsequent encounter: Secondary | ICD-10-CM | POA: Diagnosis not present

## 2018-07-16 DIAGNOSIS — R609 Edema, unspecified: Secondary | ICD-10-CM | POA: Diagnosis not present

## 2018-07-16 DIAGNOSIS — L03116 Cellulitis of left lower limb: Secondary | ICD-10-CM | POA: Diagnosis not present

## 2018-07-29 DIAGNOSIS — L97429 Non-pressure chronic ulcer of left heel and midfoot with unspecified severity: Secondary | ICD-10-CM | POA: Diagnosis not present

## 2018-07-30 DIAGNOSIS — M6281 Muscle weakness (generalized): Secondary | ICD-10-CM | POA: Diagnosis not present

## 2018-07-30 DIAGNOSIS — Q909 Down syndrome, unspecified: Secondary | ICD-10-CM | POA: Diagnosis not present

## 2018-07-30 DIAGNOSIS — Z9181 History of falling: Secondary | ICD-10-CM | POA: Diagnosis not present

## 2018-07-30 DIAGNOSIS — G309 Alzheimer's disease, unspecified: Secondary | ICD-10-CM | POA: Diagnosis not present

## 2018-07-30 DIAGNOSIS — G4700 Insomnia, unspecified: Secondary | ICD-10-CM | POA: Diagnosis not present

## 2018-07-30 DIAGNOSIS — R2681 Unsteadiness on feet: Secondary | ICD-10-CM | POA: Diagnosis not present

## 2018-08-03 DIAGNOSIS — M542 Cervicalgia: Secondary | ICD-10-CM | POA: Diagnosis not present

## 2018-08-04 DIAGNOSIS — M6281 Muscle weakness (generalized): Secondary | ICD-10-CM | POA: Diagnosis not present

## 2018-08-04 DIAGNOSIS — Q909 Down syndrome, unspecified: Secondary | ICD-10-CM | POA: Diagnosis not present

## 2018-08-04 DIAGNOSIS — G309 Alzheimer's disease, unspecified: Secondary | ICD-10-CM | POA: Diagnosis not present

## 2018-08-04 DIAGNOSIS — R2681 Unsteadiness on feet: Secondary | ICD-10-CM | POA: Diagnosis not present

## 2018-08-04 DIAGNOSIS — Z9181 History of falling: Secondary | ICD-10-CM | POA: Diagnosis not present

## 2018-08-05 DIAGNOSIS — L97429 Non-pressure chronic ulcer of left heel and midfoot with unspecified severity: Secondary | ICD-10-CM | POA: Diagnosis not present

## 2018-08-10 DIAGNOSIS — M6281 Muscle weakness (generalized): Secondary | ICD-10-CM | POA: Diagnosis not present

## 2018-08-10 DIAGNOSIS — Q909 Down syndrome, unspecified: Secondary | ICD-10-CM | POA: Diagnosis not present

## 2018-08-10 DIAGNOSIS — G309 Alzheimer's disease, unspecified: Secondary | ICD-10-CM | POA: Diagnosis not present

## 2018-08-10 DIAGNOSIS — Z9181 History of falling: Secondary | ICD-10-CM | POA: Diagnosis not present

## 2018-08-11 DIAGNOSIS — B356 Tinea cruris: Secondary | ICD-10-CM | POA: Diagnosis not present

## 2018-08-11 DIAGNOSIS — K219 Gastro-esophageal reflux disease without esophagitis: Secondary | ICD-10-CM | POA: Diagnosis not present

## 2018-08-11 DIAGNOSIS — Q909 Down syndrome, unspecified: Secondary | ICD-10-CM | POA: Diagnosis not present

## 2018-08-11 DIAGNOSIS — H6120 Impacted cerumen, unspecified ear: Secondary | ICD-10-CM | POA: Diagnosis not present

## 2018-08-11 DIAGNOSIS — E039 Hypothyroidism, unspecified: Secondary | ICD-10-CM | POA: Diagnosis not present

## 2018-08-11 DIAGNOSIS — L03116 Cellulitis of left lower limb: Secondary | ICD-10-CM | POA: Diagnosis not present

## 2018-08-12 DIAGNOSIS — N390 Urinary tract infection, site not specified: Secondary | ICD-10-CM | POA: Diagnosis not present

## 2018-08-16 ENCOUNTER — Emergency Department (HOSPITAL_COMMUNITY)
Admission: EM | Admit: 2018-08-16 | Discharge: 2018-08-16 | Disposition: A | Discharge status: Home / Self Care | Payer: Medicare Other | Attending: Emergency Medicine | Admitting: Emergency Medicine

## 2018-08-16 ENCOUNTER — Encounter (HOSPITAL_COMMUNITY): Payer: Self-pay | Admitting: Pharmacy Technician

## 2018-08-16 ENCOUNTER — Other Ambulatory Visit: Payer: Self-pay

## 2018-08-16 ENCOUNTER — Emergency Department (HOSPITAL_COMMUNITY): Payer: Medicare Other

## 2018-08-16 DIAGNOSIS — G300 Alzheimer's disease with early onset: Secondary | ICD-10-CM | POA: Diagnosis not present

## 2018-08-16 DIAGNOSIS — R404 Transient alteration of awareness: Secondary | ICD-10-CM | POA: Diagnosis not present

## 2018-08-16 DIAGNOSIS — S0990XA Unspecified injury of head, initial encounter: Secondary | ICD-10-CM | POA: Diagnosis not present

## 2018-08-16 DIAGNOSIS — W19XXXA Unspecified fall, initial encounter: Secondary | ICD-10-CM | POA: Diagnosis not present

## 2018-08-16 DIAGNOSIS — R569 Unspecified convulsions: Secondary | ICD-10-CM | POA: Insufficient documentation

## 2018-08-16 DIAGNOSIS — R4020 Unspecified coma: Secondary | ICD-10-CM | POA: Diagnosis not present

## 2018-08-16 LAB — CBC
HCT: 43.5 % (ref 36.0–46.0)
Hemoglobin: 13.9 g/dL (ref 12.0–15.0)
MCH: 32 pg (ref 26.0–34.0)
MCHC: 32 g/dL (ref 30.0–36.0)
MCV: 100.2 fL — ABNORMAL HIGH (ref 78.0–100.0)
Platelets: 156 10*3/uL (ref 150–400)
RBC: 4.34 MIL/uL (ref 3.87–5.11)
RDW: 14.1 % (ref 11.5–15.5)
WBC: 4 10*3/uL (ref 4.0–10.5)

## 2018-08-16 LAB — BASIC METABOLIC PANEL
Anion gap: 7 (ref 5–15)
BUN: 12 mg/dL (ref 6–20)
CO2: 21 mmol/L — ABNORMAL LOW (ref 22–32)
Calcium: 8.3 mg/dL — ABNORMAL LOW (ref 8.9–10.3)
Chloride: 110 mmol/L (ref 98–111)
Creatinine, Ser: 0.8 mg/dL (ref 0.44–1.00)
GLUCOSE: 96 mg/dL (ref 70–99)
Potassium: 4.1 mmol/L (ref 3.5–5.1)
SODIUM: 138 mmol/L (ref 135–145)

## 2018-08-16 LAB — CBG MONITORING, ED: Glucose-Capillary: 100 mg/dL — ABNORMAL HIGH (ref 70–99)

## 2018-08-16 NOTE — ED Notes (Signed)
Recheck BP 132/30, RN aware

## 2018-08-16 NOTE — ED Provider Notes (Signed)
[facility] EMERGENCY DEPARTMENT Provider Note   CSN: [id number] Arrival date & time: 08/16/18  1124     History   Chief Complaint Chief Complaint  Patient presents with  . Seizures    HPI Jasmine Ramirez is a 52 y.o. female.  52 yo F with a chief complaint of possible seizure-like activity.  The patient collapsed to the ground and struck her forehead.  No known history of seizures per EMS or the paperwork she came with.  Not on any antiepileptic medications.  Patient is unable to provide any further history based on her level of Down syndrome.  Level 5 caveat.  The history is provided by the patient.  Seizures   This is a new problem. The current episode started less than 1 hour ago. The problem has not changed since onset.There was 1 seizure. The most recent episode lasted 30 to 120 seconds. Pertinent negatives include no headaches, no visual disturbance, no chest pain, no nausea and no vomiting. The episode was not witnessed. There was no sensation of an aura present. The seizures did not continue in the ED. The seizure(s) had no focality. There has been no fever.    Past Medical History:  Diagnosis Date  . Allergy 06/11/2018  . Chicken pox 06/11/2018  . Down syndrome 06/11/2018  . Heart murmur 06/11/2018    Patient Active Problem List   Diagnosis Date Noted  . Hyperextension injury of cervical spine, initial encounter 06/11/2018  . Alzheimer's disease with early onset 06/11/2018  . Spinal cord injury to cervical region without bone injury (HCC) 06/11/2018  . Well adult exam 10/09/2015  . Abnormal TSH 10/09/2015  . Rash and nonspecific skin eruption 10/09/2015  . Depression 02/27/2014  . Down syndrome 12/14/2013    Past Surgical History:  Procedure Laterality Date  . KNEE SURGERY  2013  . RADIOLOGY WITH ANESTHESIA N/A 06/12/2018   Procedure: MRI WITH ANESTHESIA;  Surgeon: Radiologist, Medication, MD;  Location: [facility] OR;  Service: Radiology;   Laterality: N/A;     OB History    Gravida  0   Para  0   Term  0   Preterm  0   AB  0   Living  0     SAB  0   TAB  0   Ectopic  0   Multiple  0   Live Births           Obstetric Comments  n/a         Home Medications    Prior to Admission medications   Medication Sig Start Date End Date Taking? Authorizing Provider  acetaminophen (TYLENOL) 325 MG tablet Take 650 mg by mouth 3 (three) times daily. Knee pain   Yes [provider]  Calcium Carbonate-Vitamin D (CALCIUM 500/D PO) Take 500 mg by mouth 2 (two) times daily.    Yes [provider]  carbamide peroxide (DEBROX) 6.5 % OTIC solution Place 5 drops into the right ear 2 (two) times daily. For 4 days for ear wax build up   Yes [provider]  cephALEXin (KEFLEX) 500 MG capsule Take 500 mg by mouth 3 (three) times daily. For 7 days for left foot   Yes [provider]  Cholecalciferol (VITAMIN D3) 2000 units TABS Take 2,000 Units by mouth daily.   Yes [provider]  clotrimazole (LOTRIMIN) 1 % cream Apply 1 application topically 2 (two) times daily. Apply to groin for 14 days   Yes  [provider]  doxycycline (DORYX) 100 MG EC tablet Take 100 mg by mouth 2 (two) times daily. For 10 days for left foot   Yes [provider]  levothyroxine (SYNTHROID, LEVOTHROID) 25 MCG tablet Take 25 mcg by mouth daily before breakfast.   Yes [provider]  Melatonin 3 MG TABS Take 3 mg by mouth at bedtime.   Yes [provider]  omeprazole (PRILOSEC) 40 MG capsule Take 40 mg by mouth daily. For 14 days   Yes [provider]  dexamethasone (DECADRON) 2 MG tablet Take one tablet twice a day for one day then one tablet  A day for one day. Patient not taking: Reported on 08/16/2018 06/19/18   Regalado, Jon Billings A, MD  pantoprazole (PROTONIX) 40 MG tablet Take 1 tablet (40 mg total) by mouth daily. Patient not taking: Reported on 08/16/2018 06/19/18    Regalado, Jon Billings A, MD  polyethylene glycol (MIRALAX / GLYCOLAX) packet Take 17 g by mouth daily as needed for mild constipation. Patient not taking: Reported on 08/16/2018 06/19/18   Alba Cory, MD    Family History Family History  Problem Relation Age of Onset  . Cancer Father        Prostate  . Diabetes Father   . Cancer Maternal Grandmother        Breast  . Cancer Paternal Grandfather        Colon    Social History Social History   Tobacco Use  . Smoking status: Never Smoker  . Smokeless tobacco: Never Used  Substance Use Topics  . Alcohol use: Never    Frequency: Never  . Drug use: Never     Allergies   Patient has no known allergies.   Review of Systems Review of Systems  Constitutional: Negative for chills and fever.  HENT: Negative for congestion and rhinorrhea.   Eyes: Negative for redness and visual disturbance.  Respiratory: Negative for shortness of breath and wheezing.   Cardiovascular: Negative for chest pain and palpitations.  Gastrointestinal: Negative for nausea and vomiting.  Genitourinary: Negative for dysuria and urgency.  Musculoskeletal: Negative for arthralgias and myalgias.  Skin: Negative for pallor and wound.  Neurological: Positive for seizures. Negative for dizziness and headaches.     Physical Exam Updated Vital Signs BP (!) 87/70   Pulse 71   Temp 98 F (36.7 C)   Resp 16   SpO2 100%   Physical Exam  Constitutional: She is oriented to person, place, and time. She appears well-developed and well-nourished. No distress.  HENT:  Head: Normocephalic.  Left frontal hematoma  Eyes: Pupils are equal, round, and reactive to light. EOM are normal.  Neck: Normal range of motion. Neck supple.  Cardiovascular: Normal rate and regular rhythm. Exam reveals no gallop and no friction rub.  No murmur heard. Pulmonary/Chest: Effort normal. She has no wheezes. She has no rales.  Abdominal: Soft. She exhibits no distension. There is  no tenderness.  Musculoskeletal: She exhibits no edema or tenderness.  Neurological: She is alert and oriented to person, place, and time.  Moving all 4 extremities spontaneously.  No appreciable weakness.  Skin: Skin is warm and dry. She is not diaphoretic.  Psychiatric: She has a normal mood and affect. Her behavior is normal.  Nursing note and vitals reviewed.    ED Treatments / Results  Labs (all labs ordered are listed, but only abnormal results are displayed) Labs Reviewed  BASIC METABOLIC PANEL - Abnormal; Notable for the  following components:      Result Value   CO2 21 (*)    Calcium 8.3 (*)    All other components within normal limits  CBC - Abnormal; Notable for the following components:   MCV 100.2 (*)    All other components within normal limits  CBG MONITORING, ED - Abnormal; Notable for the following components:   Glucose-Capillary 100 (*)    All other components within normal limits    EKG None  Radiology Ct Head Wo Contrast  Result Date: 08/16/2018 CLINICAL DATA:  52 year old female status post tonic/clonic seizure, fell off of couch. Left forehead hematoma. EXAM: CT HEAD WITHOUT CONTRAST TECHNIQUE: Contiguous axial images were obtained from the base of the skull through the vertex without intravenous contrast. COMPARISON:  Head CT and limited brain MRI 06/11/2018 FINDINGS: Brain: Stable cerebral volume. Stable somewhat prominent ventricle size within angular configuration of the lateral ventricles as before. Bulky dystrophic calcification in the basal ganglia is stable, and also mildly involves the deep left cerebellar nuclei. No cortical encephalomalacia identified. No midline shift, mass effect, evidence of mass lesion, intracranial hemorrhage or evidence of cortically based acute infarction. Gray-white matter differentiation is within normal limits throughout the brain. Vascular: No suspicious intracranial vascular hyperdensity. Skull: Stable.  No acute osseous  abnormality identified. Sinuses/Orbits: Chronic mastoid sclerosis. Hypoplastic frontal and sphenoid sinuses. Visible maxillary and ethmoid air cells remain clear. Other: No acute scalp hematoma identified. Regressed midline forehead hematoma since July. Visible orbits soft tissues appear stable and negative. IMPRESSION: 1. No acute intracranial abnormality. No acute traumatic injury identified. 2. Stable non contrast CT appearance of the brain with nonspecific cerebral volume loss and mild probable ex vacuo ventricular enlargement. Electronically Signed   By: Odessa Fleming M.D.   On: 08/16/2018 13:04    Procedures Procedures (including critical care time)  Medications Ordered in ED Medications - No data to display   Initial Impression / Assessment and Plan / ED Course  I have reviewed the triage vital signs and the nursing notes.  Pertinent labs & imaging results that were available during my care of the patient were reviewed by me and considered in my medical decision making (see chart for details).     52 yo F here with possible seizure-like activity.  The patient had a fall earlier today.  Struck her head.  Unsure of patient's baseline.  Lives in a group home.  Will check labs CT the head.  Family arrived with more history.  The patient had some generalized shaking and then struck her head on the table and continued to shake on the ground.  This lasted for approximately 45 seconds.  She then was really sleepy and had snoring respirations.  She was recently taken off of a sleeping aid though they deny any added medications.  She just gotten out of rehab for her contusion of her cervical cord when she had fallen a couple months ago.  Is currently back to her baseline.  CT head is negative.  I discussed case with Dr. Amada Jupiter, there is nothing he would do differently for someone with Down syndrome.  We will have her follow-up as an outpatient.  1:31 PM:  I have discussed the  diagnosis/risks/treatment options with the patient and family and believe the pt to be eligible for discharge home to follow-up with PCP, neuro. We also discussed returning to the ED immediately if new or worsening sx occur. We discussed the sx which are most concerning (e.g., recurrent  seizure) that necessitate immediate return. Medications administered to the patient during their visit and any new prescriptions provided to the patient are listed below.  Medications given during this visit Medications - No data to display    The patient appears reasonably screen and/or stabilized for discharge and I doubt any other medical condition or other [facility] requiring further screening, evaluation, or treatment in the ED at this time prior to discharge.    Final Clinical Impressions(s) / ED Diagnoses   Final diagnoses:  Seizure-like activity [facility])    ED Discharge Orders         Ordered    Ambulatory referral to Neurology     08/16/18 1323           Melene Plan, DO 08/16/18 1331

## 2018-08-16 NOTE — ED Triage Notes (Signed)
Pt BIB EMS from group home ([address]) with grand mal sz activity approx 45 seconds. Pt fell off couch when sz. Hematoma to L forehead from falling. Alert, post ictal. Pt baseline A&OX4. Hx CP. 22g LFA. 84/56, HR 80, RR 16, 100%RA, 102 CBG. Pt with hospitalized several months ago for falling and hitting head. No history of seizures.

## 2018-08-16 NOTE — ED Notes (Signed)
CBG RESULT 100

## 2018-08-16 NOTE — Discharge Instructions (Signed)
Return to the ED for repeat event.  Follow up with a neurologist.

## 2018-08-17 ENCOUNTER — Encounter: Payer: Self-pay | Admitting: Neurology

## 2018-08-18 DIAGNOSIS — Q909 Down syndrome, unspecified: Secondary | ICD-10-CM | POA: Diagnosis not present

## 2018-08-18 DIAGNOSIS — E039 Hypothyroidism, unspecified: Secondary | ICD-10-CM | POA: Diagnosis not present

## 2018-08-18 DIAGNOSIS — F0390 Unspecified dementia without behavioral disturbance: Secondary | ICD-10-CM | POA: Diagnosis not present

## 2018-08-18 DIAGNOSIS — Z6841 Body Mass Index (BMI) 40.0 and over, adult: Secondary | ICD-10-CM | POA: Diagnosis not present

## 2018-08-22 DIAGNOSIS — M79672 Pain in left foot: Secondary | ICD-10-CM | POA: Diagnosis not present

## 2018-08-22 DIAGNOSIS — M79671 Pain in right foot: Secondary | ICD-10-CM | POA: Diagnosis not present

## 2018-08-25 DIAGNOSIS — L03116 Cellulitis of left lower limb: Secondary | ICD-10-CM | POA: Diagnosis not present

## 2018-08-25 DIAGNOSIS — R569 Unspecified convulsions: Secondary | ICD-10-CM | POA: Diagnosis not present

## 2018-08-25 DIAGNOSIS — K219 Gastro-esophageal reflux disease without esophagitis: Secondary | ICD-10-CM | POA: Diagnosis not present

## 2018-08-25 DIAGNOSIS — E039 Hypothyroidism, unspecified: Secondary | ICD-10-CM | POA: Diagnosis not present

## 2018-08-25 DIAGNOSIS — Z6841 Body Mass Index (BMI) 40.0 and over, adult: Secondary | ICD-10-CM | POA: Diagnosis not present

## 2018-08-25 DIAGNOSIS — F79 Unspecified intellectual disabilities: Secondary | ICD-10-CM | POA: Diagnosis not present

## 2018-08-25 DIAGNOSIS — R8290 Unspecified abnormal findings in urine: Secondary | ICD-10-CM | POA: Diagnosis not present

## 2018-08-26 ENCOUNTER — Observation Stay (HOSPITAL_COMMUNITY): Payer: Medicare Other

## 2018-08-26 ENCOUNTER — Encounter (HOSPITAL_COMMUNITY): Payer: Self-pay | Admitting: Emergency Medicine

## 2018-08-26 ENCOUNTER — Emergency Department (HOSPITAL_BASED_OUTPATIENT_CLINIC_OR_DEPARTMENT_OTHER): Payer: Medicare Other

## 2018-08-26 ENCOUNTER — Observation Stay (HOSPITAL_COMMUNITY)
Admission: EM | Admit: 2018-08-26 | Discharge: 2018-08-27 | Disposition: A | Discharge status: Home / Self Care | Payer: Medicare Other | Attending: Oncology | Admitting: Oncology

## 2018-08-26 DIAGNOSIS — Q210 Ventricular septal defect: Secondary | ICD-10-CM

## 2018-08-26 DIAGNOSIS — K219 Gastro-esophageal reflux disease without esophagitis: Secondary | ICD-10-CM | POA: Diagnosis not present

## 2018-08-26 DIAGNOSIS — I959 Hypotension, unspecified: Secondary | ICD-10-CM | POA: Diagnosis not present

## 2018-08-26 DIAGNOSIS — G300 Alzheimer's disease with early onset: Secondary | ICD-10-CM

## 2018-08-26 DIAGNOSIS — Z9181 History of falling: Secondary | ICD-10-CM

## 2018-08-26 DIAGNOSIS — Z87828 Personal history of other (healed) physical injury and trauma: Secondary | ICD-10-CM

## 2018-08-26 DIAGNOSIS — I5030 Unspecified diastolic (congestive) heart failure: Secondary | ICD-10-CM

## 2018-08-26 DIAGNOSIS — G309 Alzheimer's disease, unspecified: Secondary | ICD-10-CM | POA: Diagnosis not present

## 2018-08-26 DIAGNOSIS — R569 Unspecified convulsions: Secondary | ICD-10-CM | POA: Diagnosis not present

## 2018-08-26 DIAGNOSIS — Z79890 Hormone replacement therapy (postmenopausal): Secondary | ICD-10-CM | POA: Diagnosis not present

## 2018-08-26 DIAGNOSIS — I350 Nonrheumatic aortic (valve) stenosis: Secondary | ICD-10-CM | POA: Diagnosis not present

## 2018-08-26 DIAGNOSIS — F0280 Dementia in other diseases classified elsewhere without behavioral disturbance: Secondary | ICD-10-CM

## 2018-08-26 DIAGNOSIS — R05 Cough: Secondary | ICD-10-CM

## 2018-08-26 DIAGNOSIS — E039 Hypothyroidism, unspecified: Secondary | ICD-10-CM | POA: Diagnosis not present

## 2018-08-26 DIAGNOSIS — Z79899 Other long term (current) drug therapy: Secondary | ICD-10-CM

## 2018-08-26 DIAGNOSIS — R059 Cough, unspecified: Secondary | ICD-10-CM

## 2018-08-26 DIAGNOSIS — Q909 Down syndrome, unspecified: Secondary | ICD-10-CM | POA: Insufficient documentation

## 2018-08-26 DIAGNOSIS — W19XXXA Unspecified fall, initial encounter: Secondary | ICD-10-CM | POA: Diagnosis not present

## 2018-08-26 LAB — COMPREHENSIVE METABOLIC PANEL
ALT: 42 U/L (ref 0–44)
AST: 34 U/L (ref 15–41)
Albumin: 3.1 g/dL — ABNORMAL LOW (ref 3.5–5.0)
Alkaline Phosphatase: 59 U/L (ref 38–126)
Anion gap: 10 (ref 5–15)
BILIRUBIN TOTAL: 0.5 mg/dL (ref 0.3–1.2)
BUN: 18 mg/dL (ref 6–20)
CO2: 24 mmol/L (ref 22–32)
Calcium: 8.7 mg/dL — ABNORMAL LOW (ref 8.9–10.3)
Chloride: 107 mmol/L (ref 98–111)
Creatinine, Ser: 0.93 mg/dL (ref 0.44–1.00)
GFR calc Af Amer: >60 mL/min (ref >60)
GFR calc non Af Amer: >60 mL/min (ref >60)
GLUCOSE: 95 mg/dL (ref 70–99)
POTASSIUM: 4.2 mmol/L (ref 3.5–5.1)
Sodium: 141 mmol/L (ref 135–145)
TOTAL PROTEIN: 5.5 g/dL — AB (ref 6.5–8.1)

## 2018-08-26 LAB — CBC
HEMATOCRIT: 43.9 % (ref 36.0–46.0)
Hemoglobin: 13.4 g/dL (ref 12.0–15.0)
MCH: 30.8 pg (ref 26.0–34.0)
MCHC: 30.5 g/dL (ref 30.0–36.0)
MCV: 100.9 fL — ABNORMAL HIGH (ref 80.0–100.0)
NRBC: 0 % (ref 0.0–0.2)
Platelets: 188 10*3/uL (ref 150–400)
RBC: 4.35 MIL/uL (ref 3.87–5.11)
RDW: 14.4 % (ref 11.5–15.5)
WBC: 4.1 10*3/uL (ref 4.0–10.5)

## 2018-08-26 LAB — TROPONIN I: Troponin I: <0.03 ng/mL (ref <0.03)

## 2018-08-26 LAB — ECHOCARDIOGRAM COMPLETE

## 2018-08-26 LAB — MAGNESIUM: Magnesium: 2 mg/dL (ref 1.7–2.4)

## 2018-08-26 MED ORDER — LACTATED RINGERS IV BOLUS
1000.0000 mL | Freq: Once | INTRAVENOUS | Status: AC
  Administered 2018-08-26: 1000 mL via INTRAVENOUS

## 2018-08-26 MED ORDER — ACETAMINOPHEN 650 MG RE SUPP: 650.0000 mg | Freq: Four times a day (QID) | RECTAL | Status: DC | PRN

## 2018-08-26 MED ORDER — ACETAMINOPHEN 325 MG PO TABS: 650.0000 mg | ORAL_TABLET | Freq: Four times a day (QID) | ORAL | Status: DC | PRN

## 2018-08-26 MED ORDER — ENOXAPARIN SODIUM 40 MG/0.4ML ~~LOC~~ SOLN
40.0000 mg | SUBCUTANEOUS | Status: DC
  Administered 2018-08-26: 40 mg via SUBCUTANEOUS
  Filled 2018-08-26: qty 0.4

## 2018-08-26 MED ORDER — SODIUM CHLORIDE 0.9 % IV SOLN
INTRAVENOUS | Status: DC | PRN
  Administered 2018-08-26: 250 mL via INTRAVENOUS

## 2018-08-26 MED ORDER — LEVETIRACETAM IN NACL 1000 MG/100ML IV SOLN: 1000.0000 mg | INTRAVENOUS | Status: DC

## 2018-08-26 MED ORDER — PANTOPRAZOLE SODIUM 40 MG PO TBEC
40.0000 mg | DELAYED_RELEASE_TABLET | Freq: Every day | ORAL | Status: DC
  Administered 2018-08-27: 40 mg via ORAL
  Filled 2018-08-26: qty 1

## 2018-08-26 MED ORDER — LEVETIRACETAM IN NACL 1000 MG/100ML IV SOLN
1000.0000 mg | Freq: Once | INTRAVENOUS | Status: AC
  Administered 2018-08-26: 1000 mg via INTRAVENOUS
  Filled 2018-08-26: qty 100

## 2018-08-26 MED ORDER — LEVOTHYROXINE SODIUM 25 MCG PO TABS
25.0000 ug | ORAL_TABLET | Freq: Every day | ORAL | Status: DC
  Administered 2018-08-27: 25 ug via ORAL
  Filled 2018-08-26: qty 1

## 2018-08-26 MED ORDER — LEVETIRACETAM IN NACL 500 MG/100ML IV SOLN
500.0000 mg | Freq: Two times a day (BID) | INTRAVENOUS | Status: DC
  Administered 2018-08-26 – 2018-08-27 (×2): 500 mg via INTRAVENOUS
  Filled 2018-08-26 (×2): qty 100

## 2018-08-26 MED ORDER — POLYETHYLENE GLYCOL 3350 17 G PO PACK: 17.0000 g | PACK | Freq: Every day | ORAL | Status: DC | PRN

## 2018-08-26 MED ORDER — MELATONIN 3 MG PO TABS
3.0000 mg | ORAL_TABLET | Freq: Every day | ORAL | Status: DC
  Filled 2018-08-26: qty 1

## 2018-08-26 NOTE — Progress Notes (Signed)
Patient received, VSS, NAD noted. Agree with previous RN assessment.

## 2018-08-26 NOTE — Discharge Summary (Signed)
Name: Jasmine Ramirez MRN: [id number] DOB: 11-24-1965 52 y.o. PCP: Lucretia Field  Date of Admission: 08/26/2018  8:47 AM Date of Discharge: 08/27/2018 Attending Physician: Levert Feinstein, MD  Discharge Diagnosis: 1. Seizure  Discharge Medications: Allergies as of 08/27/2018   No Known Allergies     Medication List    TAKE these medications   acetaminophen 325 MG tablet Commonly known as:  TYLENOL Take 650 mg by mouth 3 (three) times daily. Knee pain   CALCIUM 500/D PO Take 500 mg by mouth 2 (two) times daily.   carbamide peroxide 6.5 % OTIC solution Commonly known as:  DEBROX Place 5 drops into the right ear 2 (two) times daily. For 4 days for ear wax build up   cephALEXin 500 MG capsule Commonly known as:  KEFLEX Take 500 mg by mouth 3 (three) times daily. For 7 days for left foot   clotrimazole 1 % cream Commonly known as:  LOTRIMIN Apply 1 application topically 2 (two) times daily. Apply to groin for 14 days   dexamethasone 2 MG tablet Commonly known as:  DECADRON Take one tablet twice a day for one day then one tablet  A day for one day.   doxycycline 100 MG EC tablet Commonly known as:  DORYX Take 100 mg by mouth 2 (two) times daily. For 10 days for left foot   levETIRAcetam 500 MG tablet Commonly known as:  KEPPRA Take 1 tablet (500 mg total) by mouth 2 (two) times daily.   levETIRAcetam 500 MG tablet Commonly known as:  KEPPRA Take 1 tablet (500 mg total) by mouth 2 (two) times daily. Start taking on:  09/27/2018   levothyroxine 25 MCG tablet Commonly known as:  SYNTHROID, LEVOTHROID Take 25 mcg by mouth daily before breakfast.   Melatonin 3 MG Tabs Take 3 mg by mouth at bedtime.   omeprazole 40 MG capsule Commonly known as:  PRILOSEC Take 40 mg by mouth daily. For 14 days   pantoprazole 40 MG tablet Commonly known as:  PROTONIX Take 1 tablet (40 mg total) by mouth daily.   polyethylene glycol packet Commonly known as:   MIRALAX / GLYCOLAX Take 17 g by mouth daily as needed for mild constipation.   Vitamin D3 2000 units Tabs Take 2,000 Units by mouth daily.       Disposition and follow-up:   Jasmine Ramirez was discharged from [facility] in Stable condition.  At the hospital follow up visit please address:  1.  Seizures- Patient was discharged on Keppra 500 mg BID, reassess seizure activity and response to new medication  2.  Labs / imaging needed at time of follow-up: none  3.  Pending labs/ test needing follow-up: none  Follow-up Appointments: Follow-up Information     NEUROLOGY. Go on 10/13/2018.   Contact information: [address] [place], [address] [place] [address] [phone]          Hospital Course by problem list: 1. Seizures- Jasmine Ramirez presented with seizure like activity.She had a fall in July where she sustained a C2 spinal cord injury. Two weeks ago she presented with a grand mal seizure. She was not started on a medication at that time because it was presumed to be her first event and it was unclear whether the fall in July was due to a seizure. She was at her group home she was hugging a caregiver when she suddenly sneezed, became stiff and started shaking. The episode lasted  less than a minute. She remained lethargic and did not come back to baseline even in the ED. This episode she presented with less shaking and shorter duration than the last episode in September. After both episodes she became lethargic, fell asleep and started snoring. The history was obtained from her sister who believes she is near her baseline now but still lethargic and in and out of it. EEG did not show any epileptiform activity. Echo showed a LV Ef 55-60% with no diastolic dysfunction, no valve abnormalities, and a small VSD. She was discharged with Keppra 500 mg BID and to follow up with [facility] 11/27.  Discharge Vitals:   BP (!) 93/55 (BP  Location: Left Arm)   Pulse (!) 47   Temp 98.2 F (36.8 C)   Resp 18   SpO2 98%   Pertinent Labs, Studies, and Procedures:   Dg Chest 2 View  Result Date: 08/26/2018 CLINICAL DATA:  Seizure.  Down syndrome. EXAM: CHEST - 2 VIEW COMPARISON:  June 11, 2018. FINDINGS: There is no edema or consolidation. Heart is upper normal in size with pulmonary vascularity normal. No adenopathy. No appreciable bone lesions. IMPRESSION: No edema or consolidation. Electronically Signed   By: Bretta Bang III M.D.   On: 08/26/2018 16:12    Discharge Instructions: Discharge Instructions    Diet - low sodium heart healthy   Complete by:  As directed    Discharge instructions   Complete by:  As directed    [name],   It was a pleasure taking care of you! We will want you to continue taking a medication to prevent any more seizures. I have sent a prescription for Keppra 500 mg twice a day to your pharmacy. Please follow up with [facility] next week for your scheduled appointment.  Thank you for allowing Jasmine Ramirez to be a part of your care.   Increase activity slowly   Complete by:  As directed       Signed: Jaci Standard, DO 08/27/2018, 10:37 AM   Pager: [phone]

## 2018-08-26 NOTE — ED Triage Notes (Signed)
From group home- Downs syndrome. Seizure was witnessed. Went to [facility] and locked up- staff laid her on ground. She was post ictal and now at baseline. Sleepy. Seizure a week ago but no follow up with neurologist. Only hx of seizure was one last week.

## 2018-08-26 NOTE — Progress Notes (Signed)
Medicine attending admission note: I personally interviewed and examined this patient with additional history given by her sister who is at the bedside.  I reviewed pertinent laboratory and imaging data.  I attest to the accuracy of the evaluation and management plan which will be recorded in the history and physical exam by resident physician Dr Lerry Liner.  52 year old woman with Down syndrome living in a group home.  She is capable of performing her own activities of daily living.  She fell down on June 12, 2018.  She suffered a contusion of the C2/C3 spinal cord.  No canal hematoma or impingement.  2 weeks ago, she presented with a grand mal seizure and was evaluated here on September 30.  Since this was a first event and it was unclear whether the fall back in July occurred because of a seizure, anticonvulsant therapy was not initiated.  She presents today with a recurrent witnessed? event that per her sister only lasted for a few minutes where she again fell over in her chair, transiently lost consciousness, and became rigid without any tonic-clonic jerking. Initial blood pressure 87/70, pulse 71 regular, respirations 16, oxygen saturation 100% on room air, temperature 98.  In the ED she was awake and alert, no gross focal neurologic deficits.  Pertinent lab: No major electrolyte abnormalities.  Bicarbonate 24, anion gap 10, random glucose 95.  Hemoglobin 13.4, white count 4100.  CT brain: No acute abnormalities.  Chest x-ray: Normal  EKG: Normal sinus rhythm no acute ischemic change or arrhythmia  Echocardiogram: Ejection fraction 55-60%.  No diastolic dysfunction.  Small VSD noted.  Mild dilation of the right ventricle.  Trileaflet aortic valve.  No valve abnormalities.  EEG: Generalized slowing.  No epileptic focus.  Current exam: Blood pressure (!) 122/104, pulse (!) 49, temperature 98.4 F (36.9 C), resp. rate 18, SpO2 (!) 87 %. She is awake, alert, minimally verbally communicative,  she does not really follow orders.  I cannot assess whether she is oriented to time or place.  Tip of the nose is erythematous. Pupils equal round reactive to light.  Full extraocular movements.  No facial asymmetry.  Motor strength appears grossly normal.  She with draws all extremities to minor stimulation.  Reflexes 2+ symmetric at the knees and at the biceps. Regular cardiac rhythm without murmur or gallop.  No click.  Carotids 1+.  No bruits. Marked cyanosis of her fingers, minor cyanosis of her toes.  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 This may be related to recent cervical trauma sustained in July with contusion of the C2/C3 spinal cord. I think it is unlikely to be cardioembolic in etiology with sinus rhythm and a normal echocardiogram.  She was seen in consultation by neurology.  Keppra initiated.  We will admit for observation. She has no other major medical or surgical problems.

## 2018-08-26 NOTE — Consult Note (Addendum)
Neurology Consultation Reason for Consult: Seizure Referring Physician: Silverio Lay CC: Seizure History is obtained from: Mother  HPI: Jasmine Ramirez is a 52 y.o. female with Down syndrome.  Patient was recently seen in July after she had a fall which was unwitnessed.  At that time she had neck injury.  She also has had another episode of shaking and what sounds like tonic-clonic activity in September.  Today she was at her group home when she had apparently sneezed to grabbed facility person and then had shaking.  In talking to the sister the events have been very stereotypic and there is concern that the first episode of unwitnessed fall, might have been a seizure too..   There is no history of preceding fevers or chills.  No history of preceding flulike symptoms.  No preceding history of any abdominal pain nausea vomiting.  No preceding history of headaches.  No preceding history of any such episodes that brought her in today having happened in her early childhood.  She never had seizures as a child.  She has had a couple of eye surgeries as a child and has a murmur but no acute neurological symptoms. Neurology consultation was called because the patient continued to be lethargic and not back to baseline even 3 to 4 hours after the episode happened.  The sister at bedside agreed that the patient was slightly more lethargic than her usual self.  The sister described the patient has a very generally happy and calm person but she was more lethargic today than usual. Patient was also nonverbal during the initial part of the course in the emergency room, again raising concerns for this being a prolonged postictal state.  ROS: Unable to obtain due to altered mental status.   Past Medical History:  Diagnosis Date  . Allergy 06/11/2018  . Chicken pox 06/11/2018  . Down syndrome 06/11/2018  . Heart murmur 06/11/2018   Family History  Problem Relation Age of Onset  . Cancer Father        Prostate  .  Diabetes Father   . Cancer Maternal Grandmother        Breast  . Cancer Paternal Grandfather        Colon   Social History:   reports that she has never smoked. She has never used smokeless tobacco. She reports that she does not drink alcohol or use drugs.  Medications  Current Facility-Administered Medications:  .  levETIRAcetam (KEPPRA) IVPB 500 mg/100 mL premix, 500 mg, Intravenous, Q12H, Ulice Dash, PA-C  Current Outpatient Medications:  .  acetaminophen (TYLENOL) 325 MG tablet, Take 650 mg by mouth 3 (three) times daily. Knee pain, Disp: , Rfl:  .  Calcium Carbonate-Vitamin D (CALCIUM 500/D PO), Take 500 mg by mouth 2 (two) times daily. , Disp: , Rfl:  .  carbamide peroxide (DEBROX) 6.5 % OTIC solution, Place 5 drops into the right ear 2 (two) times daily. For 4 days for ear wax build up, Disp: , Rfl:  .  cephALEXin (KEFLEX) 500 MG capsule, Take 500 mg by mouth 3 (three) times daily. For 7 days for left foot, Disp: , Rfl:  .  Cholecalciferol (VITAMIN D3) 2000 units TABS, Take 2,000 Units by mouth daily., Disp: , Rfl:  .  clotrimazole (LOTRIMIN) 1 % cream, Apply 1 application topically 2 (two) times daily. Apply to groin for 14 days, Disp: , Rfl:  .  doxycycline (DORYX) 100 MG EC tablet, Take 100 mg by mouth 2 (two) times daily.  For 10 days for left foot, Disp: , Rfl:  .  levothyroxine (SYNTHROID, LEVOTHROID) 25 MCG tablet, Take 25 mcg by mouth daily before breakfast., Disp: , Rfl:  .  Melatonin 3 MG TABS, Take 3 mg by mouth at bedtime., Disp: , Rfl:  .  omeprazole (PRILOSEC) 40 MG capsule, Take 40 mg by mouth daily. For 14 days, Disp: , Rfl:  .  dexamethasone (DECADRON) 2 MG tablet, Take one tablet twice a day for one day then one tablet  A day for one day. (Patient not taking: Reported on 08/16/2018), Disp: 4 tablet, Rfl: 0 .  pantoprazole (PROTONIX) 40 MG tablet, Take 1 tablet (40 mg total) by mouth daily. (Patient not taking: Reported on 08/16/2018), Disp: 30 tablet, Rfl: 0 .   polyethylene glycol (MIRALAX / GLYCOLAX) packet, Take 17 g by mouth daily as needed for mild constipation. (Patient not taking: Reported on 08/16/2018), Disp: 14 each, Rfl: 0   Exam: Current vital signs: BP (!) 107/49   Pulse (!) 49   Resp 12   SpO2 100%  Vital signs in last 24 hours: Pulse Rate:  [39-63] 49 (10/10 1300) Resp:  [9-16] 12 (10/10 1300) BP: (80-114)/(43-76) 107/49 (10/10 1300) SpO2:  [96 %-100 %] 100 % (10/10 1300)  Physical Exam  Constitutional: Appears well-developed and well-nourished.  Psych: Affect appropriate to situation Eyes: No scleral injection HENT: No OP obstrucion Head: Normocephalic.  Cardiovascular: Normal rate and regular rhythm.  Respiratory: Effort normal, non-labored breathing GI: Soft.  No distension. There is no tenderness.  Skin: WDI  Neuro: Mental Status: Patient is awake, however baseline she would not be able to tell you the date, month, year. She was able to name a pen and a watch but could not tell me what she would do if I give her the pen her hand. Cranial Nerves: II: Tracks me in the room.  And at one point was able to count my fingers. III,IV, VI: EOMI without ptosis or diploplia.  V: Facial sensation is symmetric to temperature VII: Facial movement is symmetric.  VIII: Hard of hearing X: Uvula elevates symmetrically XI: Shoulder shrug is symmetric. XII: tongue is midline without atrophy or fasciculations.  Motor: Doing all extremities antigravity Sensory: Sensation is symmetric to light touch and temperature in the arms and legs. Deep Tendon Reflexes: 2+ and symmetric throughout Plantars: Upgoing toes bilaterally  Labs I have reviewed labs in epic and the results pertinent to this consultation are:   CBC    Component Value Date/Time   WBC 4.1 08/26/2018 1116   RBC 4.35 08/26/2018 1116   HGB 13.4 08/26/2018 1116   HCT 43.9 08/26/2018 1116   PLT 188 08/26/2018 1116   MCV 100.9 (H) 08/26/2018 1116   MCH 30.8  08/26/2018 1116   MCHC 30.5 08/26/2018 1116   RDW 14.4 08/26/2018 1116   LYMPHSABS 2.1 10/09/2015 1629   MONOABS 0.4 10/09/2015 1629   EOSABS 0.1 10/09/2015 1629   BASOSABS 0.0 10/09/2015 1629    CMP     Component Value Date/Time   NA 141 08/26/2018 1116   K 4.2 08/26/2018 1116   CL 107 08/26/2018 1116   CO2 24 08/26/2018 1116   GLUCOSE 95 08/26/2018 1116   BUN 18 08/26/2018 1116   CREATININE 0.93 08/26/2018 1116   CALCIUM 8.7 (L) 08/26/2018 1116   PROT 5.5 (L) 08/26/2018 1116   ALBUMIN 3.1 (L) 08/26/2018 1116   AST 34 08/26/2018 1116   ALT 42 08/26/2018 1116  ALKPHOS 59 08/26/2018 1116   BILITOT 0.5 08/26/2018 1116   GFRNONAA >60 08/26/2018 1116   GFRAA >60 08/26/2018 1116  Imaging I have reviewed the images obtained: CT-scan of the brain--no acute intracranial abnormalities.  No acute traumatic injury.  Stable noncontrast CT appearance of the brain with nonspecific cerebral volume loss and mild probable ex vacuo ventricular enlargement.  Felicie Morn PA-C [facility] [phone]  M-F  (9:00 am- 5:00 PM)  08/26/2018, 1:27 PM   Assessment:  52 year old female with known Down syndrome.,  Brought in after having a generalized tonic-clonic seizure at her group home. The patient has at least 2 or even 3 episodes concerning for seizures. Given the history of Down syndrome, cardiac arrhythmias and syncope is also possible but the events were very stereotypic raising concern for seizures. Multiplicity of events is compelling enough to start her on antiepileptics. We discussed this with her sister at the bedside who agrees that we should start antiepileptics.    Impression: - New onset seizures -Evaluate for underlying dysrhythmias  Recommendations: -Keppra 1 g IV x1 -Start Keppra 500 twice daily starting tonight -Seizure precautions as documented below -Routine EEG -Maintain sleep hygiene -CT head from 08/16/2018 is normal. -Check labs including  urinalysis, toxicology screen and chest x-ray. -Repeat head CT.  I do not feel strongly about repeating a brain MRI at this time as patient required sedation to do the MRI and I would not want to sedate the patient any more than needed at this time. -Follow-up with outpatient neurology- she has an outpatient to see a neurologist at [facility] neurolog[facility] next week-keep that appointment. -Maintain on telemetry.  Also get an echocardiogram to rule out any structural abnormalities which might predispose her to syncope. -Consider cardiology evaluation if any of the cardiac work-up is concerning.  Per [facility] statutes, patients with seizures are not allowed to drive until  they have been seizure-free for six months. Use caution when using heavy equipment or power tools. Avoid working on ladders or at heights. Take showers instead of baths. Ensure the water temperature is not too high on the home water heater. Do not go swimming alone. When caring for infants or small children, sit down when holding, feeding, or changing them to minimize risk of injury to the child in the event you have a seizure.   Also, Maintain good sleep hygiene. Avoid alcohol.   Attending Neurohospitalist Addendum Patient seen and examined with APP/Resident. Agree with the history and physical as documented above. Agree with the plan as documented, which I helped formulate and have made the edits above. I have independently reviewed the chart, obtained history, review of systems and examined the patient.I have personally reviewed pertinent head/neck/spine imaging (CT/MRI).  CT head from 08/16/2018 normal.  MRI brain from 06/11/2018, limited study due to inability of the patient to tolerate the length of the exam and motion with no definite intracranial abnormality. Please feel free to call with any questions. --- Milon Dikes, MD [facility] Pager: [phone]  If 7pm to 7am, please call on call as listed on  AMION.

## 2018-08-26 NOTE — ED Provider Notes (Signed)
[facility] EMERGENCY DEPARTMENT Provider Note   CSN: [id number] Arrival date & time: 08/26/18  0847     History   Chief Complaint Chief Complaint  Patient presents with  . Seizures    HPI Jasmine Ramirez is a 52 y.o. female.  HPI  52 year old female with PMH taken for Down syndrome, Alzheimer's, hypothyroidism, recent fall with spinal cord injury, recent presentation on 08/16/2018 for seizure-like activity, who presents with seizure-like activity.  Patient is noncontributory to history.  History obtained from sister who is in the room.  Sister states that patient was walking around normally earlier today when she was giving a hug to a staff member and "locked up."  Patient was noted to lose conscious, become stiff, have mild bilateral tremors without severe convulsions.  Patient was lowered to the ground without injury.  Episode lasted roughly 1 minute followed by roughly 90 seconds of postictal period described as somnolent, sonorous breathing, altered from her baseline from sister in the room.  Sister denies recent infectious symptoms including fever, cough congestion, diarrhea or rash.  Denies recent fall with the exception of last visit.  Patient has recent negative head CT 10 days prior to present episode.  Sister is concerned that patient's fall several months ago resulting in spinal cord injury, that was unwitnessed, was also a potential seizure-like event.  This would make this the third seizure-like activity without history of prior seizures.  Patient currently has outpatient follow-up set with neurology distantly.  Patient was discharged taking trazodone for sleep after the second seizure, before this current episode.  Past Medical History:  Diagnosis Date  . Allergy 06/11/2018  . Chicken pox 06/11/2018  . Down syndrome 06/11/2018  . Heart murmur 06/11/2018    Patient Active Problem List   Diagnosis Date Noted  . Hyperextension injury of cervical  spine, initial encounter 06/11/2018  . Alzheimer's disease with early onset (HCC) 06/11/2018  . Spinal cord injury to cervical region without bone injury (HCC) 06/11/2018  . Well adult exam 10/09/2015  . Abnormal TSH 10/09/2015  . Rash and nonspecific skin eruption 10/09/2015  . Depression 02/27/2014  . Down syndrome 12/14/2013    Past Surgical History:  Procedure Laterality Date  . KNEE SURGERY  2013  . RADIOLOGY WITH ANESTHESIA N/A 06/12/2018   Procedure: MRI WITH ANESTHESIA;  Surgeon: Radiologist, Medication, MD;  Location: [facility] OR;  Service: Radiology;  Laterality: N/A;     OB History    Gravida  0   Para  0   Term  0   Preterm  0   AB  0   Living  0     SAB  0   TAB  0   Ectopic  0   Multiple  0   Live Births           Obstetric Comments  n/a         Home Medications    Prior to Admission medications   Medication Sig Start Date End Date Taking? Authorizing Provider  acetaminophen (TYLENOL) 325 MG tablet Take 650 mg by mouth 3 (three) times daily. Knee pain   Yes [provider]  Calcium Carbonate-Vitamin D (CALCIUM 500/D PO) Take 500 mg by mouth 2 (two) times daily.    Yes [provider]  carbamide peroxide (DEBROX) 6.5 % OTIC solution Place 5 drops into the right ear 2 (two) times daily. For 4 days for ear wax build up   Yes [provider]  cephALEXin (KEFLEX) 500 MG capsule Take 500 mg by mouth 3 (three) times daily. For 7 days for left foot   Yes [provider]  Cholecalciferol (VITAMIN D3) 2000 units TABS Take 2,000 Units by mouth daily.   Yes [provider]  clotrimazole (LOTRIMIN) 1 % cream Apply 1 application topically 2 (two) times daily. Apply to groin for 14 days   Yes [provider]  doxycycline (DORYX) 100 MG EC tablet Take 100 mg by mouth 2 (two) times daily. For 10 days for left foot   Yes [provider]  levothyroxine (SYNTHROID, LEVOTHROID) 25 MCG tablet Take 25 mcg by  mouth daily before breakfast.   Yes [provider]  Melatonin 3 MG TABS Take 3 mg by mouth at bedtime.   Yes [provider]  omeprazole (PRILOSEC) 40 MG capsule Take 40 mg by mouth daily. For 14 days   Yes [provider]  dexamethasone (DECADRON) 2 MG tablet Take one tablet twice a day for one day then one tablet  A day for one day. Patient not taking: Reported on 08/16/2018 06/19/18   Regalado, Jon Billings A, MD  pantoprazole (PROTONIX) 40 MG tablet Take 1 tablet (40 mg total) by mouth daily. Patient not taking: Reported on 08/16/2018 06/19/18   Regalado, Jon Billings A, MD  polyethylene glycol (MIRALAX / GLYCOLAX) packet Take 17 g by mouth daily as needed for mild constipation. Patient not taking: Reported on 08/16/2018 06/19/18   Alba Cory, MD    Family History Family History  Problem Relation Age of Onset  . Cancer Father        Prostate  . Diabetes Father   . Cancer Maternal Grandmother        Breast  . Cancer Paternal Grandfather        Colon    Social History Social History   Tobacco Use  . Smoking status: Never Smoker  . Smokeless tobacco: Never Used  Substance Use Topics  . Alcohol use: Never    Frequency: Never  . Drug use: Never     Allergies   Patient has no known allergies.   Review of Systems Review of Systems  Constitutional: Negative for chills and fever.  Respiratory: Negative for cough.   Gastrointestinal: Negative for abdominal pain, diarrhea and vomiting.  Genitourinary: Negative for hematuria.  Skin: Negative for color change and rash.  Neurological: Positive for seizures. Negative for syncope.  All other systems reviewed and are negative.    Physical Exam Updated Vital Signs BP (!) 102/43   Pulse (!) 43   Resp 11   SpO2 100%   Physical Exam  Constitutional: She appears well-developed and well-nourished. No distress.  HENT:  Head: Normocephalic and atraumatic.  Eyes: Conjunctivae are normal.  Neck: Neck supple.    Cardiovascular: Normal rate and regular rhythm.  No murmur heard. Pulmonary/Chest: Effort normal and breath sounds normal. No respiratory distress.  Abdominal: Soft. There is no tenderness.  Musculoskeletal: She exhibits edema (Bilateral lower extremity edema.).  Neurological: She is alert. GCS eye subscore is 4. GCS verbal subscore is 5. GCS motor subscore is 6.  PERRLA, CN II through XII intact, patient moving all 4 extremities.  Skin: Skin is warm and dry. Capillary refill takes less than 2 seconds. She is not diaphoretic.  Psychiatric: She has a normal mood and affect.  Nursing note and vitals reviewed.    ED Treatments / Results  Labs (all labs ordered are listed, but only abnormal results  are displayed) Labs Reviewed  CBC - Abnormal; Notable for the following components:      Result Value   MCV 100.9 (*)    All other components within normal limits  COMPREHENSIVE METABOLIC PANEL - Abnormal; Notable for the following components:   Calcium 8.7 (*)    Total Protein 5.5 (*)    Albumin 3.1 (*)    All other components within normal limits  MAGNESIUM  TROPONIN I    EKG EKG Interpretation  Date/Time:  Thursday August 26 2018 10:15:30 EDT Ventricular Rate:  54 PR Interval:    QRS Duration: 108 QT Interval:  471 QTC Calculation: 447 R Axis:   95 Text Interpretation:  Sinus rhythm Borderline right axis deviation ST elev, probable normal early repol pattern No significant change since last tracing Confirmed by Richardean Canal [phone]) on 08/26/2018 10:17:46 AM   Radiology No results found.  Procedures Procedures (including critical care time)  Medications Ordered in ED Medications  lactated ringers bolus 1,000 mL (1,000 mLs Intravenous New Bag/Given 08/26/18 1033)     Initial Impression / Assessment and Plan / ED Course  I have reviewed the triage vital signs and the nursing notes.  Pertinent labs & imaging results that were available during my care of the patient  were reviewed by me and considered in my medical decision making (see chart for details).     52 year old female with PMH taken for Down syndrome, Alzheimer's, hypothyroidism, recent fall with spinal cord injury, recent presentation on 08/16/2018 for seizure-like activity, who presents with seizure-like activity.  History as above.  DDX includes symptomatically to cardia versus repeat seizures.  Patient is not back to baseline based off of sister in the room.  Labs without significant abnormality.  Patient with sinus bradycardia, normal troponin, patient with history of same.  Patient not believed to have symptomatic bradycardia at this time due to significant postictal symptoms.  Neurology consulted.  Agree to see patient.  Pending recommendations.  Patient admitted to teaching service with neurology aid in managing.  Patient seen in conjunction with my attending Dr. Silverio Lay who agrees with plan and disposition.   Final Clinical Impressions(s) / ED Diagnoses   Final diagnoses:  Seizure-like activity [facility])    ED Discharge Orders    None       Margit Banda, MD 08/26/18 1315    Charlynne Pander, MD 08/27/18 [phone]

## 2018-08-26 NOTE — Progress Notes (Signed)
EEG completed, results pending. 

## 2018-08-26 NOTE — Procedures (Signed)
ELECTROENCEPHALOGRAM REPORT   Patient: Jasmine Ramirez       Room #: D33C EEG No. ID: [id number] Age: 52 y.o.        Sex: female Referring Physician: Cyndie Chime Report Date:  08/26/2018        Interpreting Physician: Thana Farr  History: Jasmine Ramirez is an 52 y.o. female with seizure-like activity  Medications:  Keppra  Conditions of Recording:  This is a 21 channel routine scalp EEG performed with bipolar and monopolar montages arranged in accordance to the international 10/20 system of electrode placement. One channel was dedicated to EKG recording.  The patient is in the awake and asleep states.  Description:  The waking background activity is slow and poorly organized.  It consists of a low to moderate voltage polymorphic delta activity that is diffusely distributed and persistent throughout the recording.  Clinically the patient appears to enter sleep but despite this there is no change in the background rhythm with a continued polymorphic delta rhythm noted.   No epileptiform activity is noted.   Hyperventilation was not performed.  Intermittent photic stimulation was performed but failed to illicit any change in the tracing.     IMPRESSION: This is an abnormal EEG secondary to general background slowing.  This finding may be seen with a diffuse disturbance that is etiologically nonspecific, but may include a metabolic encephalopathy, among other possibilities.  No epileptiform activity was noted.     Thana Farr, MD Neurology [phone] 08/26/2018, 3:55 PM

## 2018-08-26 NOTE — ED Notes (Signed)
ED Provider at bedside. 

## 2018-08-26 NOTE — Progress Notes (Signed)
  Echocardiogram 2D Echocardiogram has been performed.  Jasmine Ramirez F 08/26/2018, 2:56 PM

## 2018-08-26 NOTE — H&P (Signed)
Date: 08/26/2018               Patient Name:  Jasmine Ramirez MRN: [id number]  DOB: 1966/04/06 Age / Sex: 52 y.o., female   PCP: Lucretia Field         Medical Service: Internal Medicine Teaching Service         Attending Physician: Dr. Levert Feinstein, MD    First Contact: Dr. Karilyn Cota, Karver Fadden Pager: [phone]  Second Contact: Dr. Geralyn Corwin Pager: [phone]       After Hours (After 5p/  First Contact Pager: [phone]  weekends / holidays): Second Contact Pager: [phone]   Chief Complaint: Seizure  History of Present Illness: Jasmine Ramirez is a 52 yo female with a PMHx of down syndrome, early onset Alzheimer's disease, and recent C2 spinal cord injury presenting with seizure like activity. She was seen in July after an unwitnessed fall and at that time sustained a neck injury. She had another episode of shaking in September that seemed like seizure like activity. She sustained a fall where she hit her head and was found to be shaking; it was unclear if the shaking was before or after she hit her head. Today at her group home she was hugging a caregiver when she suddenly sneezed, became stiff and started shaking. The episode lasted less than a minute. She remained lethargic and did not come back to baseline even in the ED. This episode she presented with less shaking and shorter duration than the last episode in September. After both episodes she became lethargic, fell asleep and started snoring. The history was obtained from her sister who believes she is near her baseline now but still lethargic and in and out of it.    On arrival to the ED, she was mildly bradycardic and hypotensive. CBC, CMP and troponin were all wnl. She was given 1000 mg keppra IV and bolused with 1000 ml LR. She was non-verbal in the ED which was concerning for prolonged postictal state. She was not started on an anti-seizure medication during her last hospitalization because it was her first seizure  event.  Meds:  Current Meds  Medication Sig  . acetaminophen (TYLENOL) 325 MG tablet Take 650 mg by mouth 3 (three) times daily. Knee pain  . Calcium Carbonate-Vitamin D (CALCIUM 500/D PO) Take 500 mg by mouth 2 (two) times daily.   . carbamide peroxide (DEBROX) 6.5 % OTIC solution Place 5 drops into the right ear 2 (two) times daily. For 4 days for ear wax build up  . cephALEXin (KEFLEX) 500 MG capsule Take 500 mg by mouth 3 (three) times daily. For 7 days for left foot  . Cholecalciferol (VITAMIN D3) 2000 units TABS Take 2,000 Units by mouth daily.  . clotrimazole (LOTRIMIN) 1 % cream Apply 1 application topically 2 (two) times daily. Apply to groin for 14 days  . doxycycline (DORYX) 100 MG EC tablet Take 100 mg by mouth 2 (two) times daily. For 10 days for left foot  . levothyroxine (SYNTHROID, LEVOTHROID) 25 MCG tablet Take 25 mcg by mouth daily before breakfast.  . Melatonin 3 MG TABS Take 3 mg by mouth at bedtime.  Marland Kitchen omeprazole (PRILOSEC) 40 MG capsule Take 40 mg by mouth daily. For 14 days     Allergies: Allergies as of 08/26/2018  . (No Known Allergies)   Past Medical History:  Diagnosis Date  . Allergy 06/11/2018  . Chicken pox 06/11/2018  . Down syndrome 06/11/2018  .  Heart murmur 06/11/2018    Family History:  Family History  Problem Relation Age of Onset  . Cancer Father        Prostate  . Diabetes Father   . Cancer Maternal Grandmother        Breast  . Cancer Paternal Grandfather        Colon    Social History:   She lives in a group home where she receives 24/7 care. She has never smoked or used any drugs. She is able to feed and dress herself. After her fall 3 months ago she has required assistance in bathing herself. She is usually able to communicate; however, at times due to her dementia she cannot communicate well.   Social History   Socioeconomic History  . Marital status: Single    Spouse name: Not on file  . Number of children: Not on file  .  Years of education: Not on file  . Highest education level: Not on file  Occupational History  . Not on file  Social Needs  . Financial resource strain: Not on file  . Food insecurity:    Worry: Not on file    Inability: Not on file  . Transportation needs:    Medical: Not on file    Non-medical: Not on file  Tobacco Use  . Smoking status: Never Smoker  . Smokeless tobacco: Never Used  Substance and Sexual Activity  . Alcohol use: Never    Frequency: Never  . Drug use: Never  . Sexual activity: Never  Lifestyle  . Physical activity:    Days per week: Not on file    Minutes per session: Not on file  . Stress: Not on file  Relationships  . Social connections:    Talks on phone: Patient refused    Gets together: Patient refused    Attends religious service: Patient refused    Active member of club or organization: Patient refused    Attends meetings of clubs or organizations: Patient refused    Relationship status: Patient refused  . Intimate partner violence:    Fear of current or ex partner: Patient refused    Emotionally abused: Patient refused    Physically abused: Patient refused    Forced sexual activity: Patient refused  Other Topics Concern  . Not on file  Social History Narrative  . Not on file    Review of Systems: A complete ROS was not completed besides what is mentioned in the HPI; history was obtained from her sister.   Physical Exam: Blood pressure (!) 122/104, pulse (!) 49, temperature 98.4 F (36.9 C), resp. rate 18, SpO2 (!) 87 %.  General- seen lying comfortably in bed, NAD Heart- ? Fixed S2, no murmurs Lungs- CTA bilaterally Abdomen- no tenderness, no distension Extremities- hands were blue and cold, no edema Neuro- UE and LE reflexes intact bilaterally  Eyes- PERRLA   EKG: personally reviewed my interpretation is sinus rhythm, unchanged from priors  CXR: personally reviewed my interpretation is no abnormality, no consolidation or  edema  Assessment & Plan by Problem: Active Problems:   Seizure [facility])  Jasmine Ramirez is a 52 yo female with a PMHx of down syndrome and early onset Alzheimer's disease presenting with seizure like activity. She had a fall in July where she sustained a C2 spinal cord injury. Two weeks ago she presented with a grand mal seizure. She was not started on a medication at that time because it was presumed to be  her first event and it was unclear whether the fall in July was due to a seizure.   Seizure Patient seems to have 2-3 episodes over the last few months that are concerning for seizures. She presented with a grand mal seizure on 9/30 but was not started on any medications at that time because it was her first seizure.  She was given keppra 1 g IV x1 in the ED. Neurology started Keppra 500 mg bid.  - continue seizure precautions - MRI was done during last visit and required sedation, not necessary at this time; consider repeat CT  - Pt is scheduled to follow up with [facility] neurology next week  - Echo shows LV EF 55-60%, normal systolic function, no change from prior; does not need further cardiac work up at this time  - EEG abnormal but no epileptiform activity noted; findings may be seen with diffuse disturbance with nonspecific etiology such as metabolic encephalopathy    Hypothyroid disease - continue levothyroxine 25 mcg qd  GERD - pantoprazole 40 mg qd  Diet: Regular DVT prophylaxis: Lovenox Full code  Dispo: Admit patient to Observation with expected length of stay less than 2 midnights.  SignedJaci Standard, DO 08/26/2018, 5:31 PM  Pager: [phone]

## 2018-08-27 DIAGNOSIS — R569 Unspecified convulsions: Secondary | ICD-10-CM | POA: Diagnosis not present

## 2018-08-27 DIAGNOSIS — K219 Gastro-esophageal reflux disease without esophagitis: Secondary | ICD-10-CM | POA: Diagnosis not present

## 2018-08-27 DIAGNOSIS — Z87828 Personal history of other (healed) physical injury and trauma: Secondary | ICD-10-CM | POA: Diagnosis not present

## 2018-08-27 DIAGNOSIS — Q909 Down syndrome, unspecified: Secondary | ICD-10-CM | POA: Diagnosis not present

## 2018-08-27 DIAGNOSIS — E039 Hypothyroidism, unspecified: Secondary | ICD-10-CM | POA: Diagnosis not present

## 2018-08-27 DIAGNOSIS — Z9181 History of falling: Secondary | ICD-10-CM | POA: Diagnosis not present

## 2018-08-27 DIAGNOSIS — Z79890 Hormone replacement therapy (postmenopausal): Secondary | ICD-10-CM | POA: Diagnosis not present

## 2018-08-27 DIAGNOSIS — F0280 Dementia in other diseases classified elsewhere without behavioral disturbance: Secondary | ICD-10-CM | POA: Diagnosis not present

## 2018-08-27 DIAGNOSIS — G300 Alzheimer's disease with early onset: Secondary | ICD-10-CM | POA: Diagnosis not present

## 2018-08-27 DIAGNOSIS — Z79899 Other long term (current) drug therapy: Secondary | ICD-10-CM | POA: Diagnosis not present

## 2018-08-27 LAB — URINALYSIS, ROUTINE W REFLEX MICROSCOPIC
Bilirubin Urine: NEGATIVE
Glucose, UA: NEGATIVE mg/dL
HGB URINE DIPSTICK: NEGATIVE
KETONES UR: NEGATIVE mg/dL
Leukocytes, UA: NEGATIVE
Nitrite: NEGATIVE
PROTEIN: NEGATIVE mg/dL
Specific Gravity, Urine: 1.015 (ref 1.005–1.030)
pH: 7 (ref 5.0–8.0)

## 2018-08-27 LAB — RAPID URINE DRUG SCREEN, HOSP PERFORMED
Amphetamines: NOT DETECTED
Barbiturates: NOT DETECTED
Benzodiazepines: NOT DETECTED
Cocaine: NOT DETECTED
OPIATES: NOT DETECTED
Tetrahydrocannabinol: NOT DETECTED

## 2018-08-27 LAB — HIV ANTIBODY (ROUTINE TESTING W REFLEX): HIV Screen 4th Generation wRfx: NONREACTIVE

## 2018-08-27 MED ORDER — LEVETIRACETAM 500 MG PO TABS: 500.0000 mg | ORAL_TABLET | Freq: Two times a day (BID) | ORAL | 0 refills | Status: DC

## 2018-08-27 MED ORDER — LEVETIRACETAM 500 MG PO TABS: 500.0000 mg | ORAL_TABLET | Freq: Two times a day (BID) | ORAL | 0 refills | Status: AC

## 2018-08-27 NOTE — Progress Notes (Addendum)
NEURO HOSPITALIST PROGRESS NOTE   Subjective: Patient awake, alert, in bed, NAD.  Able to follow simple commands.  Exam: Vitals:   08/27/18 0337 08/27/18 0758  BP: (!) 108/48 (!) 93/55  Pulse: (!) 58 (!) 47  Resp: 18 18  Temp: 97.9 F (36.6 C) 98.2 F (36.8 C)  SpO2: 98% 98%    Physical Exam  Constitutional: Appears well-developed and well-nourished.  Psych: Affect appropriate to situation Eyes: No scleral injection HENT: No OP obstrucion Head: Normocephalic.  Cardiovascular: Normal rate and regular rhythm.  Respiratory: Effort normal, non-labored breathing GI: Soft.  No distension. There is no tenderness.  Skin: WDI Neuro:  Mental Status: Alert, awake. Back to baseline. Able to say a few words but not full sentences. Follows some simple commands and mimics some commands - inconsistently. Cranial Nerves: II:  Visual fields grossly normal,  III,IV, VI: ptosis not present, extra-ocular motions intact bilaterally pupils equal, round, reactive to light and accommodation V,VII: smile symmetric, facial light touch sensation normal bilaterally VIII: hearing normal bilaterally IX,X: uvula rises symmetrically XI: bilateral shoulder shrug XII: midline tongue extension Motor: Right : Upper extremity   5/5  Left:     Upper extremity   5/5  Lower extremity   5/5   Lower extremity   5/5 Tone and bulk:normal tone throughout; no atrophy noted Sensory: light touch intact throughout, bilaterally Deep Tendon Reflexes: 2+ and symmetric biceps, patella Plantars: Right: upgoing   Left: upgoing   Medications:  Scheduled: . enoxaparin (LOVENOX) injection  40 mg Subcutaneous Q24H  . levothyroxine  25 mcg Oral QAC breakfast  . Melatonin  3 mg Oral QHS  . pantoprazole  40 mg Oral Daily   Continuous: . sodium chloride 250 mL (08/26/18 2113)  . levETIRAcetam 500 mg (08/27/18 0839)   ZOX:WRUEAV chloride, acetaminophen **OR** acetaminophen, polyethylene  glycol  Pertinent Labs/Diagnostics: UDS: WNL U/A: WNL ECHO 08/27/18: Compared to the prior study, there has been no significant   interval change.  Routine EEG 08-26-18 This is an abnormal EEG secondary to general background slowing.  This finding may be seen with a diffuse disturbance that is etiologically nonspecific, but may include a metabolic encephalopathy, among other possibilities.  No epileptiform activity was noted Dg Chest 2 View  Result Date: 08/26/2018 CLINICAL DATA:  Seizure.  Down syndrome. EXAM: CHEST - 2 VIEW COMPARISON:  June 11, 2018. FINDINGS: There is no edema or consolidation. Heart is upper normal in size with pulmonary vascularity normal. No adenopathy. No appreciable bone lesions. IMPRESSION: No edema or consolidation. Electronically Signed   By: Bretta Bang III M.D.   On: 08/26/2018 16:12  Telemetry-unremarkable Echocardiogram- Unremarkable  Assessment:  52 year old female with known Down syndrome, who was bought in from her group home after having a generalized tonic-clonic seizure at her group home. The patient has at least 2 or even 3 episodes concerning for seizures. Given the history of Down syndrome, cardiac arrhythmias and syncope is also possible but the events were very stereotypic raising concern for seizures. Multiplicity of events is compelling enough to start her on antiepileptics. We discussed this with her sister at the bedside who agrees that we should start antiepileptics.    Impression: - New onset seizures -work-up completed -Evaluate for underlying dysrhythmias-so far no evidence of dysrhythmia on telemetry and echo was normal.  Recommendations:  -continue Keppra 500 twice daily  -No  need to repeat CT as she is back to baseline -Seizure precautions as documented below -Maintain sleep hygiene -Follow-up with outpatient neurology- she has an appointment  to see a neurologist at [facility] neurology next week-keep that  appointment. -Maintain on telemetry.  - Neurology to sign off at this time. Please call with any further questions or concerns.   Per [facility] statutes, patients with seizures are not allowed to drive until  they have been seizure-free for six months. Use caution when using heavy equipment or power tools. Avoid working on ladders or at heights. Take showers instead of baths. Ensure the water temperature is not too high on the home water heater. Do not go swimming alone. When caring for infants or small children, sit down when holding, feeding, or changing them to minimize risk of injury to the child in the event you have a seizure.   Also, Maintain good sleep hygiene. Avoid alcohol.  Jasmine Lucks, MSN, NP-C [facility] [phone]  Attending neurologist's note to follow Attending Neurohospitalist Addendum Patient seen and examined with APP/Resident. Agree with the history and physical as documented above. Agree with the plan as documented, which I helped formulate. I have independently reviewed the chart, obtained history, review of systems and examined the patient.I have personally reviewed pertinent head/neck/spine imaging (CT/MRI). Please feel free to call with any questions. --- Milon Dikes, MD [facility] Pager: [phone]  If 7pm to 7am, please call on call as listed on AMION.  08/27/2018, 9:24 AM

## 2018-08-27 NOTE — Clinical Social Work Note (Signed)
Clinical Social Work Assessment  Patient Details  Name: Jasmine Ramirez MRN: [id number] Date of Birth: 01/01/1966  Date of referral:  08/27/18               Reason for consult:  Facility Placement                Permission sought to share information with:  Facility Contact Representative, Family Supports Permission granted to share information::  Yes, Verbal Permission Granted  Name::     Nicole  Agency::  [facility]  Relationship::  Sister/POA  Contact Information:     Housing/Transportation Living arrangements for the past 2 months:  Group Home Source of Information:  Facility, Power of Attorney, Siblings Patient Interpreter Needed:  None Criminal Activity/Legal Involvement Pertinent to Current Situation/Hospitalization:  No - Comment as needed Significant Relationships:  Siblings Lives with:  Self, Facility Resident Do you feel safe going back to the place where you live?  Yes Need for family participation in patient care:  Yes (Comment)  Care giving concerns:  Patient from [facility] and family has no concerns about care received.   Social Worker assessment / plan:  CSW met with patient's sister to discuss return to Group Home. CSW contacted RN at Group Home to provide updated information. CSW coordinated with patient's group home to set up transport for patient to return.  Employment status:  Disabled (Comment on whether or not currently receiving Disability) Insurance information:  Medicare PT Recommendations:  Not assessed at this time Information / Referral to community resources:     Patient/Family's Response to care:  Patient's sister agreeable for patient to return to group home.  Patient/Family's Understanding of and Emotional Response to Diagnosis, Current Treatment, and Prognosis:  Patient's sister appreciative of CSW assistance in coordinating patient's return. Patient's sister asked about patient's insurance coverage, as the patient's previous admission had  difficulty with insurance coverage. Patient's sister said she would follow up to make sure everything was resolved with the insurance issues.  Emotional Assessment Appearance:  Appears stated age Attitude/Demeanor/Rapport:  Unable to Assess Affect (typically observed):  Unable to Assess Orientation:  Oriented to Self Alcohol / Substance use:  Not Applicable Psych involvement (Current and /or in the community):  No (Comment)  Discharge Needs  Concerns to be addressed:  Care Coordination Readmission within the last 30 days:  No Current discharge risk:  None Barriers to Discharge:  No Barriers Identified    M , LCSW 08/27/2018, 12:47 PM  

## 2018-08-27 NOTE — Progress Notes (Signed)
   Subjective: Jasmine Ramirez was able to follow a few simple commands this morning. She was seen sitting up in bed eating her breakfast. She was little more cheerful this morning and not lethargic.   Objective:  Vital signs in last 24 hours: Vitals:   08/26/18 1801 08/26/18 1950 08/26/18 2329 08/27/18 0337  BP:  (!) 93/44 (!) 87/44 (!) 108/48  Pulse:  (!) 57 (!) 56 (!) 58  Resp:  18 18 18   Temp:  (!) 97.4 F (36.3 C) 97.9 F (36.6 C) 97.9 F (36.6 C)  TempSrc:  Oral Axillary Axillary  SpO2: 99% 95% 94% 98%   General- seen sitting in bed eating breakfast, NAD Heart- fixed S2 Lungs- CTA bilaterally Extremities- no edema Eyes- PERRLA Skin- blue appearing fingers, cool to touch   Assessment/Plan:  Active Problems:   Down's syndrome   Seizure (HCC)   VSD (ventricular septal defect), perimembranous   Jasmine Ramirez is a 52 yo female with a PMHx of down syndrome and early onset Alzheimer's disease presenting with seizure like activity. She had a fall in July where she sustained a C2 spinal cord injury. Two weeks ago she presented with a grand mal seizure. She was not started on a medication at that time because it was presumed to be her first event and it was unclear whether the fall in July was due to a seizure.   Seizure Patient seems to have 2-3 episodes over the last few months that are concerning for seizures. She presented with a grand mal seizure on 9/30 but was not started on any medications at that time because it was her first seizure.  She was given keppra 1 g IV x1 in the ED. Neurology started Keppra 500 mg bid. - no overnight events - discharge on Keppra 500 mg BID - keep [facility] appointment on 11/27    Hypothyroid disease - continue levothyroxine 25 mcg qd  GERD - pantoprazole 40 mg qd  Dispo: Anticipated discharge is today.  Jaci Standard, DO 08/27/2018, 6:53 AM Pager: [phone]

## 2018-08-27 NOTE — Discharge Instructions (Signed)
Please note your Neurology appointment is 10/13/18 and not next week. I have sent two prescriptions for Keppra so you will have enough until you are seen by Neurology.  Also I sent in one prescription of Keppra to your pharmacy. It is a one month supply. I have printed a second prescription for when you run out of the first month.   Thanks

## 2018-08-27 NOTE — Progress Notes (Signed)
NURSING PROGRESS NOTE  Jasmine Ramirez [id number] Discharge Data: 08/27/2018 12:53 PM Attending Provider: Levert Feinstein, MD [id number], Garnetta Buddy to be D/C'd group home  per MD order.  Discussed with the patient the After Visit Summary and all questions fully answered. All IV's discontinued with no bleeding noted. All belongings returned to patient for patient to take home.   Last Vital Signs:  Blood pressure (!) 81/71, pulse (!) 50, temperature 98.2 F (36.8 C), temperature source Oral, resp. rate 18, SpO2 100 %.  Discharge Medication List Allergies as of 08/27/2018   No Known Allergies     Medication List    TAKE these medications   acetaminophen 325 MG tablet Commonly known as:  TYLENOL Take 650 mg by mouth 3 (three) times daily. Knee pain   CALCIUM 500/D PO Take 500 mg by mouth 2 (two) times daily.   carbamide peroxide 6.5 % OTIC solution Commonly known as:  DEBROX Place 5 drops into the right ear 2 (two) times daily. For 4 days for ear wax build up   cephALEXin 500 MG capsule Commonly known as:  KEFLEX Take 500 mg by mouth 3 (three) times daily. For 7 days for left foot   clotrimazole 1 % cream Commonly known as:  LOTRIMIN Apply 1 application topically 2 (two) times daily. Apply to groin for 14 days   dexamethasone 2 MG tablet Commonly known as:  DECADRON Take one tablet twice a day for one day then one tablet  A day for one day.   doxycycline 100 MG EC tablet Commonly known as:  DORYX Take 100 mg by mouth 2 (two) times daily. For 10 days for left foot   levETIRAcetam 500 MG tablet Commonly known as:  KEPPRA Take 1 tablet (500 mg total) by mouth 2 (two) times daily.   levETIRAcetam 500 MG tablet Commonly known as:  KEPPRA Take 1 tablet (500 mg total) by mouth 2 (two) times daily. Start taking on:  09/25/2018   levETIRAcetam 500 MG tablet Commonly known as:  KEPPRA Take 1 tablet (500 mg total) by mouth 2 (two) times  daily. Start taking on:  09/25/2018   levETIRAcetam 500 MG tablet Commonly known as:  KEPPRA Take 1 tablet (500 mg total) by mouth 2 (two) times daily. Start taking on:  09/27/2018   levothyroxine 25 MCG tablet Commonly known as:  SYNTHROID, LEVOTHROID Take 25 mcg by mouth daily before breakfast.   Melatonin 3 MG Tabs Take 3 mg by mouth at bedtime.   omeprazole 40 MG capsule Commonly known as:  PRILOSEC Take 40 mg by mouth daily. For 14 days   pantoprazole 40 MG tablet Commonly known as:  PROTONIX Take 1 tablet (40 mg total) by mouth daily.   polyethylene glycol packet Commonly known as:  MIRALAX / GLYCOLAX Take 17 g by mouth daily as needed for mild constipation.   Vitamin D3 2000 units Tabs Take 2,000 Units by mouth daily.

## 2018-08-27 NOTE — Progress Notes (Signed)
Discharge to: [facility] Anticipated discharge date: 08/27/18 Family notified: Berenice Primas at bedside Transportation by: Group home  Report #: [phone], Wyatt Mage  CSW signing off.  Blenda Nicely LCSW [phone]

## 2018-09-01 DIAGNOSIS — F0390 Unspecified dementia without behavioral disturbance: Secondary | ICD-10-CM | POA: Diagnosis not present

## 2018-09-01 DIAGNOSIS — Q909 Down syndrome, unspecified: Secondary | ICD-10-CM | POA: Diagnosis not present

## 2018-09-01 DIAGNOSIS — G40919 Epilepsy, unspecified, intractable, without status epilepticus: Secondary | ICD-10-CM | POA: Diagnosis not present

## 2018-09-01 DIAGNOSIS — Z6841 Body Mass Index (BMI) 40.0 and over, adult: Secondary | ICD-10-CM | POA: Diagnosis not present

## 2018-09-02 DIAGNOSIS — Z01419 Encounter for gynecological examination (general) (routine) without abnormal findings: Secondary | ICD-10-CM | POA: Diagnosis not present

## 2018-09-03 DIAGNOSIS — Z1231 Encounter for screening mammogram for malignant neoplasm of breast: Secondary | ICD-10-CM | POA: Diagnosis not present

## 2018-09-06 DIAGNOSIS — F819 Developmental disorder of scholastic skills, unspecified: Secondary | ICD-10-CM | POA: Diagnosis not present

## 2018-09-06 DIAGNOSIS — H538 Other visual disturbances: Secondary | ICD-10-CM | POA: Diagnosis not present

## 2018-09-06 DIAGNOSIS — Q078 Other specified congenital malformations of nervous system: Secondary | ICD-10-CM | POA: Diagnosis not present

## 2018-09-06 DIAGNOSIS — Z961 Presence of intraocular lens: Secondary | ICD-10-CM | POA: Diagnosis not present

## 2018-09-06 DIAGNOSIS — Q909 Down syndrome, unspecified: Secondary | ICD-10-CM | POA: Diagnosis not present

## 2018-09-08 DIAGNOSIS — F79 Unspecified intellectual disabilities: Secondary | ICD-10-CM | POA: Diagnosis not present

## 2018-09-08 DIAGNOSIS — Z6841 Body Mass Index (BMI) 40.0 and over, adult: Secondary | ICD-10-CM | POA: Diagnosis not present

## 2018-09-08 DIAGNOSIS — G4700 Insomnia, unspecified: Secondary | ICD-10-CM | POA: Diagnosis not present

## 2018-09-08 DIAGNOSIS — Q909 Down syndrome, unspecified: Secondary | ICD-10-CM | POA: Diagnosis not present

## 2018-09-15 DIAGNOSIS — E039 Hypothyroidism, unspecified: Secondary | ICD-10-CM | POA: Diagnosis not present

## 2018-09-15 DIAGNOSIS — Z6831 Body mass index (BMI) 31.0-31.9, adult: Secondary | ICD-10-CM | POA: Diagnosis not present

## 2018-09-15 DIAGNOSIS — G26 Extrapyramidal and movement disorders in diseases classified elsewhere: Secondary | ICD-10-CM | POA: Diagnosis not present

## 2018-09-15 DIAGNOSIS — F79 Unspecified intellectual disabilities: Secondary | ICD-10-CM | POA: Diagnosis not present

## 2018-09-20 DIAGNOSIS — N390 Urinary tract infection, site not specified: Secondary | ICD-10-CM | POA: Diagnosis not present

## 2018-09-22 DIAGNOSIS — H6123 Impacted cerumen, bilateral: Secondary | ICD-10-CM | POA: Diagnosis not present

## 2018-09-22 DIAGNOSIS — Z6839 Body mass index (BMI) 39.0-39.9, adult: Secondary | ICD-10-CM | POA: Diagnosis not present

## 2018-09-22 DIAGNOSIS — R32 Unspecified urinary incontinence: Secondary | ICD-10-CM | POA: Diagnosis not present

## 2018-09-22 DIAGNOSIS — M25561 Pain in right knee: Secondary | ICD-10-CM | POA: Diagnosis not present

## 2018-09-22 DIAGNOSIS — Z23 Encounter for immunization: Secondary | ICD-10-CM | POA: Diagnosis not present

## 2018-09-22 DIAGNOSIS — G40919 Epilepsy, unspecified, intractable, without status epilepticus: Secondary | ICD-10-CM | POA: Diagnosis not present

## 2018-09-29 DIAGNOSIS — Z6839 Body mass index (BMI) 39.0-39.9, adult: Secondary | ICD-10-CM | POA: Diagnosis not present

## 2018-09-29 DIAGNOSIS — E785 Hyperlipidemia, unspecified: Secondary | ICD-10-CM | POA: Diagnosis not present

## 2018-09-29 DIAGNOSIS — Z1231 Encounter for screening mammogram for malignant neoplasm of breast: Secondary | ICD-10-CM | POA: Diagnosis not present

## 2018-09-29 DIAGNOSIS — G4700 Insomnia, unspecified: Secondary | ICD-10-CM | POA: Diagnosis not present

## 2018-09-29 DIAGNOSIS — E039 Hypothyroidism, unspecified: Secondary | ICD-10-CM | POA: Diagnosis not present

## 2018-09-29 DIAGNOSIS — F79 Unspecified intellectual disabilities: Secondary | ICD-10-CM | POA: Diagnosis not present

## 2018-10-05 DIAGNOSIS — F0390 Unspecified dementia without behavioral disturbance: Secondary | ICD-10-CM | POA: Diagnosis not present

## 2018-10-05 DIAGNOSIS — E039 Hypothyroidism, unspecified: Secondary | ICD-10-CM | POA: Diagnosis not present

## 2018-10-05 DIAGNOSIS — Q909 Down syndrome, unspecified: Secondary | ICD-10-CM | POA: Diagnosis not present

## 2018-10-05 DIAGNOSIS — Z6835 Body mass index (BMI) 35.0-35.9, adult: Secondary | ICD-10-CM | POA: Diagnosis not present

## 2018-10-06 DIAGNOSIS — Z6839 Body mass index (BMI) 39.0-39.9, adult: Secondary | ICD-10-CM | POA: Diagnosis not present

## 2018-10-06 DIAGNOSIS — Z79899 Other long term (current) drug therapy: Secondary | ICD-10-CM | POA: Diagnosis not present

## 2018-10-06 DIAGNOSIS — G26 Extrapyramidal and movement disorders in diseases classified elsewhere: Secondary | ICD-10-CM | POA: Diagnosis not present

## 2018-10-06 DIAGNOSIS — F79 Unspecified intellectual disabilities: Secondary | ICD-10-CM | POA: Diagnosis not present

## 2018-10-07 DIAGNOSIS — Z13220 Encounter for screening for lipoid disorders: Secondary | ICD-10-CM | POA: Diagnosis not present

## 2018-10-07 DIAGNOSIS — I10 Essential (primary) hypertension: Secondary | ICD-10-CM | POA: Diagnosis not present

## 2018-10-07 DIAGNOSIS — R6889 Other general symptoms and signs: Secondary | ICD-10-CM | POA: Diagnosis not present

## 2018-10-07 DIAGNOSIS — E039 Hypothyroidism, unspecified: Secondary | ICD-10-CM | POA: Diagnosis not present

## 2018-10-07 DIAGNOSIS — D649 Anemia, unspecified: Secondary | ICD-10-CM | POA: Diagnosis not present

## 2018-10-07 DIAGNOSIS — R700 Elevated erythrocyte sedimentation rate: Secondary | ICD-10-CM | POA: Diagnosis not present

## 2018-10-12 NOTE — Progress Notes (Deleted)
NEUROLOGY CONSULTATION NOTE  Jasmine DodgeJennifer Ramirez MRN: [id number] DOB: [birth year]  Referring provider: Melene Planan Floyd, MD (ED referral) Primary care provider: Katina DungHoover Royals  Reason for consult:  seizures  HISTORY OF PRESENT ILLNESS: Jasmine Ramirez is a 52 year old right-handed woman with Down syndrome who presents for seizures.  She is accompanied by her sister who supplements history.  History also supplemented by recent hospital records.  I have previously seen the patient in March 2015 for memory deficits.  She lives in a group home.  On 06/11/2018 she sustained a spinal cord contusion at C2-3 following an unwitnessed fall.    There was no canal hematoma or cord impingement.  MRI of the brain without contrast was personally reviewed and revealed no definite acute intracranial abnormality, however it was a limited study due to motion artifact.  [facility] on 08/16/2018 for seizure-like episode.  ***.  CT of the head was personally reviewed and revealed no acute intracranial abnormality.  She did not exhibit any recurrent seizures while in the ED.  She was not started on any antiepileptic medications.  [facility] was [facility] to [facility] on 08/26/2018 for a witnessed seizure lasting a couple of minutes.  CT of the brain was personally reviewed and revealed no acute changes.  EEG was performed, which showed generalized slowing but no epileptiform discharges.  She did have an echocardiogram performed to rule out any structural cardiac abnormalities.  Small VSD was noted.  No arrhythmias were noted on telemetry.  She was started on Keppra 500 mg twice daily.  PAST MEDICAL HISTORY: Past Medical History:  Diagnosis Date  . Allergy 06/11/2018  . Chicken pox 06/11/2018  . Down syndrome 06/11/2018  . Heart murmur 06/11/2018    PAST SURGICAL HISTORY: Past Surgical History:  Procedure Laterality Date  . KNEE SURGERY  2013  . RADIOLOGY WITH ANESTHESIA N/A  06/12/2018   Procedure: MRI WITH ANESTHESIA;  Surgeon: Radiologist, Medication, MD;  Location: [facility] OR;  Service: Radiology;  Laterality: N/A;    MEDICATIONS: Current Outpatient Medications on File Prior to Visit  Medication Sig Dispense Refill  . acetaminophen (TYLENOL) 325 MG tablet Take 650 mg by mouth 3 (three) times daily. Knee pain    . Calcium Carbonate-Vitamin D (CALCIUM 500/D PO) Take 500 mg by mouth 2 (two) times daily.     . carbamide peroxide (DEBROX) 6.5 % OTIC solution Place 5 drops into the right ear 2 (two) times daily. For 4 days for ear wax build up    . cephALEXin (KEFLEX) 500 MG capsule Take 500 mg by mouth 3 (three) times daily. For 7 days for left foot    . Cholecalciferol (VITAMIN D3) 2000 units TABS Take 2,000 Units by mouth daily.    . clotrimazole (LOTRIMIN) 1 % cream Apply 1 application topically 2 (two) times daily. Apply to groin for 14 days    . dexamethasone (DECADRON) 2 MG tablet Take one tablet twice a day for one day then one tablet  A day for one day. (Patient not taking: Reported on 08/16/2018) 4 tablet 0  . doxycycline (DORYX) 100 MG EC tablet Take 100 mg by mouth 2 (two) times daily. For 10 days for left foot    . levETIRAcetam (KEPPRA) 500 MG tablet Take 1 tablet (500 mg total) by mouth 2 (two) times daily. 60 tablet 0  . levETIRAcetam (KEPPRA) 500 MG tablet Take 1 tablet (500 mg total) by mouth 2 (two) times daily. 60  tablet 0  . levETIRAcetam (KEPPRA) 500 MG tablet Take 1 tablet (500 mg total) by mouth 2 (two) times daily. 60 tablet 0  . levETIRAcetam (KEPPRA) 500 MG tablet Take 1 tablet (500 mg total) by mouth 2 (two) times daily. 60 tablet 0  . levothyroxine (SYNTHROID, LEVOTHROID) 25 MCG tablet Take 25 mcg by mouth daily before breakfast.    . Melatonin 3 MG TABS Take 3 mg by mouth at bedtime.    Marland Kitchen omeprazole (PRILOSEC) 40 MG capsule Take 40 mg by mouth daily. For 14 days    . pantoprazole (PROTONIX) 40 MG tablet Take 1 tablet (40 mg total) by mouth  daily. (Patient not taking: Reported on 08/16/2018) 30 tablet 0  . polyethylene glycol (MIRALAX / GLYCOLAX) packet Take 17 g by mouth daily as needed for mild constipation. (Patient not taking: Reported on 08/16/2018) 14 each 0   No current facility-administered medications on file prior to visit.     ALLERGIES: No Known Allergies  FAMILY HISTORY: Family History  Problem Relation Age of Onset  . Cancer Father        Prostate  . Diabetes Father   . Cancer Maternal Grandmother        Breast  . Cancer Paternal Grandfather        Colon   ***.  SOCIAL HISTORY: Social History   Socioeconomic History  . Marital status: Single    Spouse name: Not on file  . Number of children: Not on file  . Years of education: Not on file  . Highest education level: Not on file  Occupational History  . Not on file  Social Needs  . Financial resource strain: Not on file  . Food insecurity:    Worry: Not on file    Inability: Not on file  . Transportation needs:    Medical: Not on file    Non-medical: Not on file  Tobacco Use  . Smoking status: Never Smoker  . Smokeless tobacco: Never Used  Substance and Sexual Activity  . Alcohol use: Never    Frequency: Never  . Drug use: Never  . Sexual activity: Never  Lifestyle  . Physical activity:    Days per week: Not on file    Minutes per session: Not on file  . Stress: Not on file  Relationships  . Social connections:    Talks on phone: Patient refused    Gets together: Patient refused    Attends religious service: Patient refused    Active member of club or organization: Patient refused    Attends meetings of clubs or organizations: Patient refused    Relationship status: Patient refused  . Intimate partner violence:    Fear of current or ex partner: Patient refused    Emotionally abused: Patient refused    Physically abused: Patient refused    Forced sexual activity: Patient refused  Other Topics Concern  . Not on file  Social  History Narrative  . Not on file    REVIEW OF SYSTEMS: Constitutional: No fevers, chills, or sweats, no generalized fatigue, change in appetite Eyes: No visual changes, double vision, eye pain Ear, nose and throat: No hearing loss, ear pain, nasal congestion, sore throat Cardiovascular: No chest pain, palpitations Respiratory:  No shortness of breath at rest or with exertion, wheezes GastrointestinaI: No nausea, vomiting, diarrhea, abdominal pain, fecal incontinence Genitourinary:  No dysuria, urinary retention or frequency Musculoskeletal:  No neck pain, back pain Integumentary: No rash, pruritus, skin lesions Neurological:  as above Psychiatric: No depression, insomnia, anxiety Endocrine: No palpitations, fatigue, diaphoresis, mood swings, change in appetite, change in weight, increased thirst Hematologic/Lymphatic:  No purpura, petechiae. Allergic/Immunologic: no itchy/runny eyes, nasal congestion, recent allergic reactions, rashes  PHYSICAL EXAM: *** General: No acute distress.  Patient appears ***-groomed.  *** Head:  Normocephalic/atraumatic Eyes:  fundi examined but not visualized Neck: supple, no paraspinal tenderness, full range of motion Back: No paraspinal tenderness Heart: regular rate and rhythm Lungs: Clear to auscultation bilaterally. Vascular: No carotid bruits. Neurological Exam: Mental status: alert and oriented to person, place, and time, recent and remote memory intact, fund of knowledge intact, attention and concentration intact, speech fluent and not dysarthric, language intact. Cranial nerves: CN I: not tested CN II: pupils equal, round and reactive to light, visual fields intact CN III, IV, VI:  full range of motion, no nystagmus, no ptosis CN V: facial sensation intact CN VII: upper and lower face symmetric CN VIII: hearing intact CN IX, X: gag intact, uvula midline CN XI: sternocleidomastoid and trapezius muscles intact CN XII: tongue midline Bulk &  Tone: normal, no fasciculations. Motor:  5/5 throughout *** Sensation:  Pinprick *** temperature *** and vibration sensation intact.  ***. Deep Tendon Reflexes:  2+ throughout, *** toes downgoing.  *** Finger to nose testing:  Without dysmetria.  *** Heel to shin:  Without dysmetria.  *** Gait:  Normal station and stride.  Able to turn and tandem walk. Romberg ***.  IMPRESSION: ***  PLAN: ***  Thank you for allowing me to take part in the care of this patient.  Shon Millet, DO  CC: Katina Dung, MD

## 2018-10-13 ENCOUNTER — Ambulatory Visit: Payer: Self-pay | Admitting: Neurology

## 2018-10-20 DIAGNOSIS — Q909 Down syndrome, unspecified: Secondary | ICD-10-CM | POA: Diagnosis not present

## 2018-10-20 DIAGNOSIS — Z6839 Body mass index (BMI) 39.0-39.9, adult: Secondary | ICD-10-CM | POA: Diagnosis not present

## 2018-10-20 DIAGNOSIS — F0390 Unspecified dementia without behavioral disturbance: Secondary | ICD-10-CM | POA: Diagnosis not present

## 2018-10-20 DIAGNOSIS — E039 Hypothyroidism, unspecified: Secondary | ICD-10-CM | POA: Diagnosis not present

## 2018-11-03 DIAGNOSIS — H900 Conductive hearing loss, bilateral: Secondary | ICD-10-CM | POA: Diagnosis not present

## 2018-11-03 DIAGNOSIS — H6123 Impacted cerumen, bilateral: Secondary | ICD-10-CM | POA: Diagnosis not present

## 2018-11-24 DIAGNOSIS — E039 Hypothyroidism, unspecified: Secondary | ICD-10-CM | POA: Diagnosis not present

## 2018-11-24 DIAGNOSIS — E559 Vitamin D deficiency, unspecified: Secondary | ICD-10-CM | POA: Diagnosis not present

## 2018-11-24 DIAGNOSIS — F79 Unspecified intellectual disabilities: Secondary | ICD-10-CM | POA: Diagnosis not present

## 2018-11-24 DIAGNOSIS — Z6839 Body mass index (BMI) 39.0-39.9, adult: Secondary | ICD-10-CM | POA: Diagnosis not present

## 2018-11-24 DIAGNOSIS — K219 Gastro-esophageal reflux disease without esophagitis: Secondary | ICD-10-CM | POA: Diagnosis not present

## 2018-11-24 DIAGNOSIS — Q909 Down syndrome, unspecified: Secondary | ICD-10-CM | POA: Diagnosis not present

## 2018-11-24 DIAGNOSIS — E785 Hyperlipidemia, unspecified: Secondary | ICD-10-CM | POA: Diagnosis not present

## 2018-12-08 DIAGNOSIS — F79 Unspecified intellectual disabilities: Secondary | ICD-10-CM | POA: Diagnosis not present

## 2018-12-08 DIAGNOSIS — G26 Extrapyramidal and movement disorders in diseases classified elsewhere: Secondary | ICD-10-CM | POA: Diagnosis not present

## 2018-12-08 DIAGNOSIS — E039 Hypothyroidism, unspecified: Secondary | ICD-10-CM | POA: Diagnosis not present

## 2018-12-09 DIAGNOSIS — E039 Hypothyroidism, unspecified: Secondary | ICD-10-CM | POA: Diagnosis not present

## 2018-12-21 ENCOUNTER — Ambulatory Visit: Payer: Medicare Other | Admitting: Neurology

## 2018-12-21 NOTE — Progress Notes (Signed)
NEUROLOGY CONSULTATION NOTE  Jasmine Ramirez MRN: [id number] DOB: Dec 25, 1965  Referring provider: Katina Dung, MD Primary care provider: Katina Dung, MD  Reason for consult:  seizure  HISTORY OF PRESENT ILLNESS: Jasmine Ramirez is a 53 year old woman with Down syndrome who presents for seizure.  Patient is poor historian.  She is accompanied by her sitter who supplements history.  History also supplemented by hospital notes.  She has cognitive impairment and has lived in a group home since the 1990s.  On 06/11/2018, she sustained a C2 spinal cord contusion following a fall.  Etiology of the fall was not known.  MRI of the brain at that time was personally reviewed, which was limited due to patient intolerance but revealed no obvious acute abnormality.  On 08/16/2018, she presented to the ED following seizure-like activity.  She exhibited some generalized shaking and then fell, striking her head.  The shaking continued for about 45 seconds followed by period of lethargy.  In the ED, she had a CT of the head which was personally reviewed and was negative for any acute abnormality.  As it was a new onset seizure, she was not started on an antiepileptic at that time.  She was discharged with an appointment for outpatient neurology.  She was admitted to [facility] on 08/26/2018 for witnessed seizure lasting a few minutes.  CT of the brain was personally reviewed and showed no acute changes.  EEG was performed which showed generalized slowing but no epileptiform discharges.  Echocardiogram showed a small VSD but was negative for any structural cardiac abnormalities or valvular deformities.  EKG and cardiac monitoring revealed no arrhythmias.  She was started on Keppra 500 mg twice daily.  Since then, she has not had any spells, falls or seizure activity.    PAST MEDICAL HISTORY: Past Medical History:  Diagnosis Date  . Allergy 06/11/2018  . Chicken pox 06/11/2018  . Down syndrome  06/11/2018  . Heart murmur 06/11/2018    PAST SURGICAL HISTORY: Past Surgical History:  Procedure Laterality Date  . KNEE SURGERY  2013  . RADIOLOGY WITH ANESTHESIA N/A 06/12/2018   Procedure: MRI WITH ANESTHESIA;  Surgeon: Radiologist, Medication, MD;  Location: [facility] OR;  Service: Radiology;  Laterality: N/A;    MEDICATIONS: Current Outpatient Medications on File Prior to Visit  Medication Sig Dispense Refill  . acetaminophen (TYLENOL) 325 MG tablet Take 650 mg by mouth 3 (three) times daily. Knee pain    . Calcium Carbonate-Vitamin D (CALCIUM 500/D PO) Take 500 mg by mouth 2 (two) times daily.     . carbamide peroxide (DEBROX) 6.5 % OTIC solution Place 5 drops into the right ear 2 (two) times daily. For 4 days for ear wax build up    . cephALEXin (KEFLEX) 500 MG capsule Take 500 mg by mouth 3 (three) times daily. For 7 days for left foot    . Cholecalciferol (VITAMIN D3) 2000 units TABS Take 2,000 Units by mouth daily.    . clotrimazole (LOTRIMIN) 1 % cream Apply 1 application topically 2 (two) times daily. Apply to groin for 14 days    . dexamethasone (DECADRON) 2 MG tablet Take one tablet twice a day for one day then one tablet  A day for one day. (Patient not taking: Reported on 08/16/2018) 4 tablet 0  . doxycycline (DORYX) 100 MG EC tablet Take 100 mg by mouth 2 (two) times daily. For 10 days for left foot    . levETIRAcetam (KEPPRA) 500  MG tablet Take 1 tablet (500 mg total) by mouth 2 (two) times daily. 60 tablet 0  . levETIRAcetam (KEPPRA) 500 MG tablet Take 1 tablet (500 mg total) by mouth 2 (two) times daily. 60 tablet 0  . levETIRAcetam (KEPPRA) 500 MG tablet Take 1 tablet (500 mg total) by mouth 2 (two) times daily. 60 tablet 0  . levETIRAcetam (KEPPRA) 500 MG tablet Take 1 tablet (500 mg total) by mouth 2 (two) times daily. 60 tablet 0  . levothyroxine (SYNTHROID, LEVOTHROID) 25 MCG tablet Take 25 mcg by mouth daily before breakfast.    . Melatonin 3 MG TABS Take 3 mg by mouth  at bedtime.    Marland Kitchen. omeprazole (PRILOSEC) 40 MG capsule Take 40 mg by mouth daily. For 14 days    . pantoprazole (PROTONIX) 40 MG tablet Take 1 tablet (40 mg total) by mouth daily. (Patient not taking: Reported on 08/16/2018) 30 tablet 0  . polyethylene glycol (MIRALAX / GLYCOLAX) packet Take 17 g by mouth daily as needed for mild constipation. (Patient not taking: Reported on 08/16/2018) 14 each 0   No current facility-administered medications on file prior to visit.     ALLERGIES: No Known Allergies  FAMILY HISTORY: Family History  Problem Relation Age of Onset  . Cancer Father        Prostate  . Diabetes Father   . Cancer Maternal Grandmother        Breast  . Cancer Paternal Grandfather        Colon   SOCIAL HISTORY: Social History   Socioeconomic History  . Marital status: Single    Spouse name: Not on file  . Number of children: Not on file  . Years of education: Not on file  . Highest education level: Not on file  Occupational History  . Not on file  Social Needs  . Financial resource strain: Not on file  . Food insecurity:    Worry: Not on file    Inability: Not on file  . Transportation needs:    Medical: Not on file    Non-medical: Not on file  Tobacco Use  . Smoking status: Never Smoker  . Smokeless tobacco: Never Used  Substance and Sexual Activity  . Alcohol use: Never    Frequency: Never  . Drug use: Never  . Sexual activity: Never  Lifestyle  . Physical activity:    Days per week: Not on file    Minutes per session: Not on file  . Stress: Not on file  Relationships  . Social connections:    Talks on phone: Patient refused    Gets together: Patient refused    Attends religious service: Patient refused    Active member of club or organization: Patient refused    Attends meetings of clubs or organizations: Patient refused    Relationship status: Patient refused  . Intimate partner violence:    Fear of current or ex partner: Patient refused     Emotionally abused: Patient refused    Physically abused: Patient refused    Forced sexual activity: Patient refused  Other Topics Concern  . Not on file  Social History Narrative  . Not on file    REVIEW OF SYSTEMS: Constitutional: No fevers, chills, or sweats, no generalized fatigue, change in appetite Eyes: No visual changes, double vision, eye pain Ear, nose and throat: No hearing loss, ear pain, nasal congestion, sore throat Cardiovascular: No chest pain, palpitations Respiratory:  No shortness of breath at rest  or with exertion, wheezes GastrointestinaI: No nausea, vomiting, diarrhea, abdominal pain, fecal incontinence Genitourinary:  No dysuria, urinary retention or frequency Musculoskeletal:  No neck pain, back pain Integumentary: No rash, pruritus, skin lesions Neurological: as above Psychiatric: No depression, insomnia, anxiety Endocrine: No palpitations, fatigue, diaphoresis, mood swings, change in appetite, change in weight, increased thirst Hematologic/Lymphatic:  No purpura, petechiae. Allergic/Immunologic: no itchy/runny eyes, nasal congestion, recent allergic reactions, rashes  PHYSICAL EXAM: Blood pressure 102/60, pulse 60, height 4' 8.75" (1.441 m), weight 168 lb (76.2 kg). General: No acute distress.  Patient appears well-groomed.   Head:  Normocephalic/atraumatic Eyes:  fundi examined but not visualized Neck: supple, no paraspinal tenderness, full range of motion Back: No paraspinal tenderness Heart: regular rate and rhythm Lungs: Clear to auscultation bilaterally. Vascular: No carotid bruits. Neurological Exam: Mental status: alert and oriented to person only, recent and remote memory impaired, fund of knowledge impaired, attention and concentration impaired, speech fluent and not dysarthric.  Some difficulty following commands but otherwise language grossly intact. Cranial nerves: CN I: not tested CN II: pupils equal, round and reactive to light, visual  fields intact to threat CN III, IV, VI:  Grossly full range of motion, no ptosis CN V: facial sensation intact CN VII: upper and lower face symmetric CN VIII: hearing intact CN IX, X: unable to assess CN XI: unable to assess CN XII: tongue midline Bulk & Tone: normal, no fasciculations. Motor:  5/5 throughout  Sensation:  Light touch sensation intact. Deep Tendon Reflexes:  2+ throughout, toes downgoing.   Finger to nose testing:  Without dysmetria.  Heel to shin:  Without dysmetria.   Gait:  Normal station and stride.  Romberg negative.  IMPRESSION: 1.  Seizure disorder 2.  Down syndrome 3.  Alzheimer's dementia  PLAN: Levetiracetam 500mg  twice daily Follow up in 6 months.  Thank you for allowing me to take part in the care of this patient.  Shon MilletAdam Jaffe, DO  CC: Lucretia FieldHoover M Royals, MD

## 2018-12-22 ENCOUNTER — Ambulatory Visit (INDEPENDENT_AMBULATORY_CARE_PROVIDER_SITE_OTHER): Payer: Medicare Other | Admitting: Neurology

## 2018-12-22 ENCOUNTER — Encounter: Payer: Self-pay | Admitting: Neurology

## 2018-12-22 VITALS — BP 102/60 | HR 60 | Ht <= 58 in | Wt 168.0 lb

## 2018-12-22 DIAGNOSIS — G40909 Epilepsy, unspecified, not intractable, without status epilepticus: Secondary | ICD-10-CM

## 2018-12-22 DIAGNOSIS — G300 Alzheimer's disease with early onset: Secondary | ICD-10-CM | POA: Diagnosis not present

## 2018-12-22 DIAGNOSIS — E039 Hypothyroidism, unspecified: Secondary | ICD-10-CM | POA: Diagnosis not present

## 2018-12-22 DIAGNOSIS — F0390 Unspecified dementia without behavioral disturbance: Secondary | ICD-10-CM | POA: Diagnosis not present

## 2018-12-22 DIAGNOSIS — F0280 Dementia in other diseases classified elsewhere without behavioral disturbance: Secondary | ICD-10-CM | POA: Diagnosis not present

## 2018-12-22 DIAGNOSIS — Z6837 Body mass index (BMI) 37.0-37.9, adult: Secondary | ICD-10-CM | POA: Diagnosis not present

## 2018-12-22 DIAGNOSIS — Q909 Down syndrome, unspecified: Secondary | ICD-10-CM

## 2018-12-22 NOTE — Patient Instructions (Signed)
Continue levetiracetam 500mg twice daily Follow up in 6 months. 

## 2018-12-29 DIAGNOSIS — F79 Unspecified intellectual disabilities: Secondary | ICD-10-CM | POA: Diagnosis not present

## 2018-12-29 DIAGNOSIS — Q909 Down syndrome, unspecified: Secondary | ICD-10-CM | POA: Diagnosis not present

## 2018-12-29 DIAGNOSIS — G40919 Epilepsy, unspecified, intractable, without status epilepticus: Secondary | ICD-10-CM | POA: Diagnosis not present

## 2018-12-29 DIAGNOSIS — Z6837 Body mass index (BMI) 37.0-37.9, adult: Secondary | ICD-10-CM | POA: Diagnosis not present

## 2018-12-29 DIAGNOSIS — K5900 Constipation, unspecified: Secondary | ICD-10-CM | POA: Diagnosis not present

## 2018-12-29 DIAGNOSIS — H6123 Impacted cerumen, bilateral: Secondary | ICD-10-CM | POA: Diagnosis not present

## 2019-01-24 DIAGNOSIS — F0390 Unspecified dementia without behavioral disturbance: Secondary | ICD-10-CM | POA: Diagnosis not present

## 2019-01-24 DIAGNOSIS — G40919 Epilepsy, unspecified, intractable, without status epilepticus: Secondary | ICD-10-CM | POA: Diagnosis not present

## 2019-01-24 DIAGNOSIS — Z6837 Body mass index (BMI) 37.0-37.9, adult: Secondary | ICD-10-CM | POA: Diagnosis not present

## 2019-01-24 DIAGNOSIS — Q909 Down syndrome, unspecified: Secondary | ICD-10-CM | POA: Diagnosis not present

## 2019-02-23 DIAGNOSIS — Z6837 Body mass index (BMI) 37.0-37.9, adult: Secondary | ICD-10-CM | POA: Diagnosis not present

## 2019-02-23 DIAGNOSIS — G4700 Insomnia, unspecified: Secondary | ICD-10-CM | POA: Diagnosis not present

## 2019-02-23 DIAGNOSIS — K219 Gastro-esophageal reflux disease without esophagitis: Secondary | ICD-10-CM | POA: Diagnosis not present

## 2019-02-23 DIAGNOSIS — Q909 Down syndrome, unspecified: Secondary | ICD-10-CM | POA: Diagnosis not present

## 2019-02-23 DIAGNOSIS — F79 Unspecified intellectual disabilities: Secondary | ICD-10-CM | POA: Diagnosis not present

## 2019-02-23 DIAGNOSIS — E039 Hypothyroidism, unspecified: Secondary | ICD-10-CM | POA: Diagnosis not present

## 2019-03-08 DIAGNOSIS — Z79899 Other long term (current) drug therapy: Secondary | ICD-10-CM | POA: Diagnosis not present

## 2019-03-08 DIAGNOSIS — F79 Unspecified intellectual disabilities: Secondary | ICD-10-CM | POA: Diagnosis not present

## 2019-03-08 DIAGNOSIS — G26 Extrapyramidal and movement disorders in diseases classified elsewhere: Secondary | ICD-10-CM | POA: Diagnosis not present

## 2019-03-08 DIAGNOSIS — Q909 Down syndrome, unspecified: Secondary | ICD-10-CM | POA: Diagnosis not present

## 2019-03-08 DIAGNOSIS — Z6837 Body mass index (BMI) 37.0-37.9, adult: Secondary | ICD-10-CM | POA: Diagnosis not present

## 2019-03-08 DIAGNOSIS — F0281 Dementia in other diseases classified elsewhere with behavioral disturbance: Secondary | ICD-10-CM | POA: Diagnosis not present

## 2019-03-23 DIAGNOSIS — Z6837 Body mass index (BMI) 37.0-37.9, adult: Secondary | ICD-10-CM | POA: Diagnosis not present

## 2019-03-23 DIAGNOSIS — E039 Hypothyroidism, unspecified: Secondary | ICD-10-CM | POA: Diagnosis not present

## 2019-03-23 DIAGNOSIS — G4700 Insomnia, unspecified: Secondary | ICD-10-CM | POA: Diagnosis not present

## 2019-03-23 DIAGNOSIS — K219 Gastro-esophageal reflux disease without esophagitis: Secondary | ICD-10-CM | POA: Diagnosis not present

## 2019-03-23 DIAGNOSIS — F0281 Dementia in other diseases classified elsewhere with behavioral disturbance: Secondary | ICD-10-CM | POA: Diagnosis not present

## 2019-03-23 DIAGNOSIS — Q909 Down syndrome, unspecified: Secondary | ICD-10-CM | POA: Diagnosis not present

## 2019-04-19 DIAGNOSIS — N390 Urinary tract infection, site not specified: Secondary | ICD-10-CM | POA: Diagnosis not present

## 2019-04-20 DIAGNOSIS — Q909 Down syndrome, unspecified: Secondary | ICD-10-CM | POA: Diagnosis not present

## 2019-04-20 DIAGNOSIS — F0390 Unspecified dementia without behavioral disturbance: Secondary | ICD-10-CM | POA: Diagnosis not present

## 2019-04-20 DIAGNOSIS — E039 Hypothyroidism, unspecified: Secondary | ICD-10-CM | POA: Diagnosis not present

## 2019-04-20 DIAGNOSIS — Z6836 Body mass index (BMI) 36.0-36.9, adult: Secondary | ICD-10-CM | POA: Diagnosis not present

## 2019-05-04 DIAGNOSIS — F79 Unspecified intellectual disabilities: Secondary | ICD-10-CM | POA: Diagnosis not present

## 2019-05-04 DIAGNOSIS — G4700 Insomnia, unspecified: Secondary | ICD-10-CM | POA: Diagnosis not present

## 2019-05-04 DIAGNOSIS — Z6836 Body mass index (BMI) 36.0-36.9, adult: Secondary | ICD-10-CM | POA: Diagnosis not present

## 2019-05-04 DIAGNOSIS — Q909 Down syndrome, unspecified: Secondary | ICD-10-CM | POA: Diagnosis not present

## 2019-05-18 DIAGNOSIS — E039 Hypothyroidism, unspecified: Secondary | ICD-10-CM | POA: Diagnosis not present

## 2019-05-25 DIAGNOSIS — Z6836 Body mass index (BMI) 36.0-36.9, adult: Secondary | ICD-10-CM | POA: Diagnosis not present

## 2019-05-25 DIAGNOSIS — E039 Hypothyroidism, unspecified: Secondary | ICD-10-CM | POA: Diagnosis not present

## 2019-05-25 DIAGNOSIS — F0390 Unspecified dementia without behavioral disturbance: Secondary | ICD-10-CM | POA: Diagnosis not present

## 2019-05-25 DIAGNOSIS — Q909 Down syndrome, unspecified: Secondary | ICD-10-CM | POA: Diagnosis not present

## 2019-06-08 DIAGNOSIS — Z6836 Body mass index (BMI) 36.0-36.9, adult: Secondary | ICD-10-CM | POA: Diagnosis not present

## 2019-06-08 DIAGNOSIS — Q909 Down syndrome, unspecified: Secondary | ICD-10-CM | POA: Diagnosis not present

## 2019-06-08 DIAGNOSIS — E039 Hypothyroidism, unspecified: Secondary | ICD-10-CM | POA: Diagnosis not present

## 2019-06-08 DIAGNOSIS — G4700 Insomnia, unspecified: Secondary | ICD-10-CM | POA: Diagnosis not present

## 2019-06-22 DIAGNOSIS — F0281 Dementia in other diseases classified elsewhere with behavioral disturbance: Secondary | ICD-10-CM | POA: Diagnosis not present

## 2019-06-22 DIAGNOSIS — Q909 Down syndrome, unspecified: Secondary | ICD-10-CM | POA: Diagnosis not present

## 2019-06-22 DIAGNOSIS — Z6828 Body mass index (BMI) 28.0-28.9, adult: Secondary | ICD-10-CM | POA: Diagnosis not present

## 2019-06-22 DIAGNOSIS — E039 Hypothyroidism, unspecified: Secondary | ICD-10-CM | POA: Diagnosis not present

## 2019-06-22 DIAGNOSIS — W19XXXA Unspecified fall, initial encounter: Secondary | ICD-10-CM | POA: Diagnosis not present

## 2019-06-23 NOTE — Progress Notes (Deleted)
NEUROLOGY FOLLOW UP OFFICE NOTE  Jasmine DodgeJennifer Ramirez [id number]  HISTORY OF PRESENT ILLNESS: Jasmine Ramirez is a 53 year old woman with Down syndrome who follows up for seizure.  Patient is poor historian.  She is accompanied by her sitter who supplements history.   UPDATE: Current medication:  Keppra 500mg  twice daily ***  HISTORY: She has cognitive impairment and has lived in a group home since the 1990s.  On 06/11/2018, she sustained a C2 spinal cord contusion following a fall.  Etiology of the fall was not known.  MRI of the brain at that time was personally reviewed, which was limited due to patient intolerance but revealed no obvious acute abnormality.  On 08/16/2018, she presented to the ED following seizure-like activity.  She exhibited some generalized shaking and then fell, striking her head.  The shaking continued for about 45 seconds followed by period of lethargy.  In the ED, she had a CT of the head which was personally reviewed and was negative for any acute abnormality.  As it was a new onset seizure, she was not started on an antiepileptic at that time.  She was discharged with an appointment for outpatient neurology.  [facility] for witnessed seizure lasting a few minutes.  CT of the brain was personally reviewed and showed no acute changes.  EEG was performed which showed generalized slowing but no epileptiform discharges.  Echocardiogram showed a small VSD but was negative for any structural cardiac abnormalities or valvular deformities.  EKG and cardiac monitoring revealed no arrhythmias.  She was started on Keppra 500 mg twice daily.  Since then, she has not had any spells, falls or seizure activity.    PAST MEDICAL HISTORY: Past Medical History:  Diagnosis Date  . Allergy 06/11/2018  . Chicken pox 06/11/2018  . Down syndrome 06/11/2018  . Heart murmur 06/11/2018    MEDICATIONS: Current Outpatient Medications on File Prior  to Visit  Medication Sig Dispense Refill  . acetaminophen (TYLENOL) 325 MG tablet Take 650 mg by mouth 3 (three) times daily. Knee pain    . Calcium Carbonate-Vitamin D (CALCIUM 500/D PO) Take 500 mg by mouth 2 (two) times daily.     . carbamide peroxide (DEBROX) 6.5 % OTIC solution Place 5 drops into the right ear 2 (two) times daily. For 4 days for ear wax build up    . cephALEXin (KEFLEX) 500 MG capsule Take 500 mg by mouth 3 (three) times daily. For 7 days for left foot    . Cholecalciferol (VITAMIN D3) 2000 units TABS Take 2,000 Units by mouth daily.    . clotrimazole (LOTRIMIN) 1 % cream Apply 1 application topically 2 (two) times daily. Apply to groin for 14 days    . dexamethasone (DECADRON) 2 MG tablet Take one tablet twice a day for one day then one tablet  A day for one day. (Patient not taking: Reported on 08/16/2018) 4 tablet 0  . doxycycline (DORYX) 100 MG EC tablet Take 100 mg by mouth 2 (two) times daily. For 10 days for left foot    . levETIRAcetam (KEPPRA) 500 MG tablet Take 1 tablet (500 mg total) by mouth 2 (two) times daily. 60 tablet 0  . levETIRAcetam (KEPPRA) 500 MG tablet Take 1 tablet (500 mg total) by mouth 2 (two) times daily. 60 tablet 0  . levETIRAcetam (KEPPRA) 500 MG tablet Take 1 tablet (500 mg total) by mouth 2 (two) times daily. 60 tablet  0  . levETIRAcetam (KEPPRA) 500 MG tablet Take 1 tablet (500 mg total) by mouth 2 (two) times daily. 60 tablet 0  . levothyroxine (SYNTHROID, LEVOTHROID) 25 MCG tablet Take 25 mcg by mouth daily before breakfast.    . Melatonin 3 MG TABS Take 3 mg by mouth at bedtime.    Marland Kitchen. omeprazole (PRILOSEC) 40 MG capsule Take 40 mg by mouth daily. For 14 days    . pantoprazole (PROTONIX) 40 MG tablet Take 1 tablet (40 mg total) by mouth daily. (Patient not taking: Reported on 08/16/2018) 30 tablet 0  . polyethylene glycol (MIRALAX / GLYCOLAX) packet Take 17 g by mouth daily as needed for mild constipation. (Patient not taking: Reported on  08/16/2018) 14 each 0   No current facility-administered medications on file prior to visit.     ALLERGIES: No Known Allergies  FAMILY HISTORY: Family History  Problem Relation Age of Onset  . Cancer Father        Prostate  . Diabetes Father   . Cancer Maternal Grandmother        Breast  . Cancer Paternal Grandfather        Colon    SOCIAL HISTORY: Social History   Socioeconomic History  . Marital status: Single    Spouse name: Not on file  . Number of children: Not on file  . Years of education: Not on file  . Highest education level: Not on file  Occupational History  . Occupation: disabled  Social Needs  . Financial resource strain: Not on file  . Food insecurity    Worry: Not on file    Inability: Not on file  . Transportation needs    Medical: Not on file    Non-medical: Not on file  Tobacco Use  . Smoking status: Never Smoker  . Smokeless tobacco: Never Used  Substance and Sexual Activity  . Alcohol use: Never    Frequency: Never  . Drug use: Never  . Sexual activity: Never  Lifestyle  . Physical activity    Days per week: Not on file    Minutes per session: Not on file  . Stress: Not on file  Relationships  . Social Musicianconnections    Talks on phone: Patient refused    Gets together: Patient refused    Attends religious service: Patient refused    Active member of club or organization: Patient refused    Attends meetings of clubs or organizations: Patient refused    Relationship status: Patient refused  . Intimate partner violence    Fear of current or ex partner: Patient refused    Emotionally abused: Patient refused    Physically abused: Patient refused    Forced sexual activity: Patient refused  Other Topics Concern  . Not on file  Social History Narrative  . Not on file    REVIEW OF SYSTEMS: Constitutional: No fevers, chills, or sweats, no generalized fatigue, change in appetite Eyes: No visual changes, double vision, eye pain Ear, nose  and throat: No hearing loss, ear pain, nasal congestion, sore throat Cardiovascular: No chest pain, palpitations Respiratory:  No shortness of breath at rest or with exertion, wheezes GastrointestinaI: No nausea, vomiting, diarrhea, abdominal pain, fecal incontinence Genitourinary:  No dysuria, urinary retention or frequency Musculoskeletal:  No neck pain, back pain Integumentary: No rash, pruritus, skin lesions Neurological: as above Psychiatric: No depression, insomnia, anxiety Endocrine: No palpitations, fatigue, diaphoresis, mood swings, change in appetite, change in weight, increased thirst Hematologic/Lymphatic:  No  purpura, petechiae. Allergic/Immunologic: no itchy/runny eyes, nasal congestion, recent allergic reactions, rashes  PHYSICAL EXAM: *** General: No acute distress.  Patient appears ***-groomed.   Head:  Normocephalic/atraumatic Eyes:  Fundi examined but not visualized Neck: supple, no paraspinal tenderness, full range of motion Heart:  Regular rate and rhythm Lungs:  Clear to auscultation bilaterally Back: No paraspinal tenderness Neurological Exam: alert and oriented to person only. Attention span and concentration impaired, recent and remote memory impaired, fund of knowledge impaired.  Speech fluent and not dysarthric. Some difficulty following commands but able to speak, name and repeat.  CN II-XII intact. Bulk and tone normal, muscle strength 5/5 throughout.  Sensation to light touch intact.  Deep tendon reflexes 2+ throughou.  Finger to nose testing intact.  Gait normal, Romberg negative.  IMPRESSION: 1.  Seizure disorder 2.  Down Syndrome 3.  Alzheimer's dementia  PLAN: ***  Metta Clines, DO  CC: ***

## 2019-06-24 ENCOUNTER — Ambulatory Visit: Payer: Medicare Other | Admitting: Neurology

## 2019-06-29 DIAGNOSIS — E039 Hypothyroidism, unspecified: Secondary | ICD-10-CM | POA: Diagnosis not present

## 2019-06-29 DIAGNOSIS — Z6836 Body mass index (BMI) 36.0-36.9, adult: Secondary | ICD-10-CM | POA: Diagnosis not present

## 2019-06-29 DIAGNOSIS — F79 Unspecified intellectual disabilities: Secondary | ICD-10-CM | POA: Diagnosis not present

## 2019-06-29 DIAGNOSIS — K219 Gastro-esophageal reflux disease without esophagitis: Secondary | ICD-10-CM | POA: Diagnosis not present

## 2019-06-29 DIAGNOSIS — R350 Frequency of micturition: Secondary | ICD-10-CM | POA: Diagnosis not present

## 2019-06-29 DIAGNOSIS — R05 Cough: Secondary | ICD-10-CM | POA: Diagnosis not present

## 2019-06-29 DIAGNOSIS — Q909 Down syndrome, unspecified: Secondary | ICD-10-CM | POA: Diagnosis not present

## 2019-06-30 DIAGNOSIS — R8290 Unspecified abnormal findings in urine: Secondary | ICD-10-CM | POA: Diagnosis not present

## 2019-06-30 DIAGNOSIS — R05 Cough: Secondary | ICD-10-CM | POA: Diagnosis not present

## 2019-06-30 DIAGNOSIS — R350 Frequency of micturition: Secondary | ICD-10-CM | POA: Diagnosis not present

## 2019-07-01 DIAGNOSIS — Z1159 Encounter for screening for other viral diseases: Secondary | ICD-10-CM | POA: Diagnosis not present

## 2019-07-01 DIAGNOSIS — J188 Other pneumonia, unspecified organism: Secondary | ICD-10-CM | POA: Diagnosis not present

## 2019-07-06 DIAGNOSIS — J189 Pneumonia, unspecified organism: Secondary | ICD-10-CM | POA: Diagnosis not present

## 2019-07-06 DIAGNOSIS — Q909 Down syndrome, unspecified: Secondary | ICD-10-CM | POA: Diagnosis not present

## 2019-07-06 DIAGNOSIS — E039 Hypothyroidism, unspecified: Secondary | ICD-10-CM | POA: Diagnosis not present

## 2019-07-06 DIAGNOSIS — F0390 Unspecified dementia without behavioral disturbance: Secondary | ICD-10-CM | POA: Diagnosis not present

## 2019-07-06 DIAGNOSIS — Z20828 Contact with and (suspected) exposure to other viral communicable diseases: Secondary | ICD-10-CM | POA: Diagnosis not present

## 2019-07-06 DIAGNOSIS — Z03818 Encounter for observation for suspected exposure to other biological agents ruled out: Secondary | ICD-10-CM | POA: Diagnosis not present

## 2019-07-06 DIAGNOSIS — Z7189 Other specified counseling: Secondary | ICD-10-CM | POA: Diagnosis not present

## 2019-07-12 DIAGNOSIS — S43005A Unspecified dislocation of left shoulder joint, initial encounter: Secondary | ICD-10-CM | POA: Diagnosis not present

## 2019-07-13 DIAGNOSIS — Q909 Down syndrome, unspecified: Secondary | ICD-10-CM | POA: Diagnosis not present

## 2019-07-13 DIAGNOSIS — Z6837 Body mass index (BMI) 37.0-37.9, adult: Secondary | ICD-10-CM | POA: Diagnosis not present

## 2019-07-13 DIAGNOSIS — F79 Unspecified intellectual disabilities: Secondary | ICD-10-CM | POA: Diagnosis not present

## 2019-07-13 DIAGNOSIS — G4700 Insomnia, unspecified: Secondary | ICD-10-CM | POA: Diagnosis not present

## 2019-08-24 DIAGNOSIS — J309 Allergic rhinitis, unspecified: Secondary | ICD-10-CM | POA: Diagnosis not present

## 2019-08-24 DIAGNOSIS — Q909 Down syndrome, unspecified: Secondary | ICD-10-CM | POA: Diagnosis not present

## 2019-08-24 DIAGNOSIS — F0390 Unspecified dementia without behavioral disturbance: Secondary | ICD-10-CM | POA: Diagnosis not present

## 2019-08-24 DIAGNOSIS — Z6837 Body mass index (BMI) 37.0-37.9, adult: Secondary | ICD-10-CM | POA: Diagnosis not present

## 2019-08-31 DIAGNOSIS — Z0000 Encounter for general adult medical examination without abnormal findings: Secondary | ICD-10-CM | POA: Diagnosis not present

## 2019-08-31 DIAGNOSIS — E039 Hypothyroidism, unspecified: Secondary | ICD-10-CM | POA: Diagnosis not present

## 2019-08-31 DIAGNOSIS — W19XXXA Unspecified fall, initial encounter: Secondary | ICD-10-CM | POA: Diagnosis not present

## 2019-08-31 DIAGNOSIS — Q909 Down syndrome, unspecified: Secondary | ICD-10-CM | POA: Diagnosis not present

## 2019-08-31 DIAGNOSIS — Z6837 Body mass index (BMI) 37.0-37.9, adult: Secondary | ICD-10-CM | POA: Diagnosis not present

## 2019-08-31 DIAGNOSIS — I959 Hypotension, unspecified: Secondary | ICD-10-CM | POA: Diagnosis not present

## 2019-08-31 DIAGNOSIS — R600 Localized edema: Secondary | ICD-10-CM | POA: Diagnosis not present

## 2019-08-31 DIAGNOSIS — F79 Unspecified intellectual disabilities: Secondary | ICD-10-CM | POA: Diagnosis not present

## 2019-08-31 DIAGNOSIS — R001 Bradycardia, unspecified: Secondary | ICD-10-CM | POA: Diagnosis not present

## 2019-09-01 DIAGNOSIS — M7989 Other specified soft tissue disorders: Secondary | ICD-10-CM | POA: Diagnosis not present

## 2019-09-01 DIAGNOSIS — M25532 Pain in left wrist: Secondary | ICD-10-CM | POA: Diagnosis not present

## 2019-09-01 DIAGNOSIS — M79622 Pain in left upper arm: Secondary | ICD-10-CM | POA: Diagnosis not present

## 2019-09-01 DIAGNOSIS — M79642 Pain in left hand: Secondary | ICD-10-CM | POA: Diagnosis not present

## 2019-09-01 DIAGNOSIS — R079 Chest pain, unspecified: Secondary | ICD-10-CM | POA: Diagnosis not present

## 2019-09-01 DIAGNOSIS — M25512 Pain in left shoulder: Secondary | ICD-10-CM | POA: Diagnosis not present

## 2019-09-01 DIAGNOSIS — M25522 Pain in left elbow: Secondary | ICD-10-CM | POA: Diagnosis not present

## 2019-09-07 DIAGNOSIS — R600 Localized edema: Secondary | ICD-10-CM | POA: Diagnosis not present

## 2019-09-07 DIAGNOSIS — Z131 Encounter for screening for diabetes mellitus: Secondary | ICD-10-CM | POA: Diagnosis not present

## 2019-09-07 DIAGNOSIS — F0390 Unspecified dementia without behavioral disturbance: Secondary | ICD-10-CM | POA: Diagnosis not present

## 2019-09-07 DIAGNOSIS — R739 Hyperglycemia, unspecified: Secondary | ICD-10-CM | POA: Diagnosis not present

## 2019-09-07 DIAGNOSIS — Z79899 Other long term (current) drug therapy: Secondary | ICD-10-CM | POA: Diagnosis not present

## 2019-09-07 DIAGNOSIS — R001 Bradycardia, unspecified: Secondary | ICD-10-CM | POA: Diagnosis not present

## 2019-09-07 DIAGNOSIS — Q909 Down syndrome, unspecified: Secondary | ICD-10-CM | POA: Diagnosis not present

## 2019-09-07 DIAGNOSIS — Z5181 Encounter for therapeutic drug level monitoring: Secondary | ICD-10-CM | POA: Diagnosis not present

## 2019-09-07 DIAGNOSIS — E039 Hypothyroidism, unspecified: Secondary | ICD-10-CM | POA: Diagnosis not present

## 2019-09-07 DIAGNOSIS — R296 Repeated falls: Secondary | ICD-10-CM | POA: Diagnosis not present

## 2019-09-07 DIAGNOSIS — G40909 Epilepsy, unspecified, not intractable, without status epilepticus: Secondary | ICD-10-CM | POA: Diagnosis not present

## 2019-09-07 DIAGNOSIS — I959 Hypotension, unspecified: Secondary | ICD-10-CM | POA: Diagnosis not present

## 2019-09-07 DIAGNOSIS — E785 Hyperlipidemia, unspecified: Secondary | ICD-10-CM | POA: Diagnosis not present

## 2019-09-07 DIAGNOSIS — Z6837 Body mass index (BMI) 37.0-37.9, adult: Secondary | ICD-10-CM | POA: Diagnosis not present

## 2019-09-07 DIAGNOSIS — E559 Vitamin D deficiency, unspecified: Secondary | ICD-10-CM | POA: Diagnosis not present

## 2019-09-13 DIAGNOSIS — Z6837 Body mass index (BMI) 37.0-37.9, adult: Secondary | ICD-10-CM | POA: Diagnosis not present

## 2019-09-13 DIAGNOSIS — Q909 Down syndrome, unspecified: Secondary | ICD-10-CM | POA: Diagnosis not present

## 2019-09-13 DIAGNOSIS — E039 Hypothyroidism, unspecified: Secondary | ICD-10-CM | POA: Diagnosis not present

## 2019-09-13 DIAGNOSIS — G4700 Insomnia, unspecified: Secondary | ICD-10-CM | POA: Diagnosis not present

## 2019-09-14 DIAGNOSIS — Z79899 Other long term (current) drug therapy: Secondary | ICD-10-CM | POA: Diagnosis not present

## 2019-09-14 DIAGNOSIS — G4700 Insomnia, unspecified: Secondary | ICD-10-CM | POA: Diagnosis not present

## 2019-09-14 DIAGNOSIS — Q909 Down syndrome, unspecified: Secondary | ICD-10-CM | POA: Diagnosis not present

## 2019-09-14 DIAGNOSIS — Z6837 Body mass index (BMI) 37.0-37.9, adult: Secondary | ICD-10-CM | POA: Diagnosis not present

## 2019-09-14 DIAGNOSIS — F79 Unspecified intellectual disabilities: Secondary | ICD-10-CM | POA: Diagnosis not present

## 2019-10-19 DIAGNOSIS — Z6837 Body mass index (BMI) 37.0-37.9, adult: Secondary | ICD-10-CM | POA: Diagnosis not present

## 2019-10-19 DIAGNOSIS — J310 Chronic rhinitis: Secondary | ICD-10-CM | POA: Diagnosis not present

## 2019-10-19 DIAGNOSIS — F0390 Unspecified dementia without behavioral disturbance: Secondary | ICD-10-CM | POA: Diagnosis not present

## 2019-10-19 DIAGNOSIS — Q909 Down syndrome, unspecified: Secondary | ICD-10-CM | POA: Diagnosis not present

## 2019-10-20 DIAGNOSIS — E039 Hypothyroidism, unspecified: Secondary | ICD-10-CM | POA: Diagnosis not present

## 2019-10-20 DIAGNOSIS — Q909 Down syndrome, unspecified: Secondary | ICD-10-CM | POA: Diagnosis not present

## 2019-10-20 DIAGNOSIS — G4700 Insomnia, unspecified: Secondary | ICD-10-CM | POA: Diagnosis not present

## 2019-10-20 DIAGNOSIS — Z23 Encounter for immunization: Secondary | ICD-10-CM | POA: Diagnosis not present

## 2019-10-20 DIAGNOSIS — Z6837 Body mass index (BMI) 37.0-37.9, adult: Secondary | ICD-10-CM | POA: Diagnosis not present

## 2019-10-24 DIAGNOSIS — N390 Urinary tract infection, site not specified: Secondary | ICD-10-CM | POA: Diagnosis not present

## 2019-11-16 DIAGNOSIS — Z03818 Encounter for observation for suspected exposure to other biological agents ruled out: Secondary | ICD-10-CM | POA: Diagnosis not present

## 2019-11-28 DIAGNOSIS — Z23 Encounter for immunization: Secondary | ICD-10-CM | POA: Diagnosis not present

## 2019-12-05 DIAGNOSIS — N390 Urinary tract infection, site not specified: Secondary | ICD-10-CM | POA: Diagnosis not present

## 2019-12-07 DIAGNOSIS — G4700 Insomnia, unspecified: Secondary | ICD-10-CM | POA: Diagnosis not present

## 2019-12-07 DIAGNOSIS — Z79899 Other long term (current) drug therapy: Secondary | ICD-10-CM | POA: Diagnosis not present

## 2019-12-07 DIAGNOSIS — Q909 Down syndrome, unspecified: Secondary | ICD-10-CM | POA: Diagnosis not present

## 2019-12-07 DIAGNOSIS — G259 Extrapyramidal and movement disorder, unspecified: Secondary | ICD-10-CM | POA: Diagnosis not present

## 2019-12-07 DIAGNOSIS — F79 Unspecified intellectual disabilities: Secondary | ICD-10-CM | POA: Diagnosis not present

## 2019-12-07 DIAGNOSIS — Z6837 Body mass index (BMI) 37.0-37.9, adult: Secondary | ICD-10-CM | POA: Diagnosis not present

## 2019-12-20 DIAGNOSIS — E039 Hypothyroidism, unspecified: Secondary | ICD-10-CM | POA: Diagnosis not present

## 2019-12-20 DIAGNOSIS — Z6838 Body mass index (BMI) 38.0-38.9, adult: Secondary | ICD-10-CM | POA: Diagnosis not present

## 2019-12-20 DIAGNOSIS — G4700 Insomnia, unspecified: Secondary | ICD-10-CM | POA: Diagnosis not present

## 2019-12-20 DIAGNOSIS — F0390 Unspecified dementia without behavioral disturbance: Secondary | ICD-10-CM | POA: Diagnosis not present

## 2019-12-20 DIAGNOSIS — Q909 Down syndrome, unspecified: Secondary | ICD-10-CM | POA: Diagnosis not present

## 2019-12-23 DIAGNOSIS — H538 Other visual disturbances: Secondary | ICD-10-CM | POA: Diagnosis not present

## 2019-12-23 DIAGNOSIS — F819 Developmental disorder of scholastic skills, unspecified: Secondary | ICD-10-CM | POA: Diagnosis not present

## 2019-12-23 DIAGNOSIS — Z961 Presence of intraocular lens: Secondary | ICD-10-CM | POA: Diagnosis not present

## 2019-12-23 DIAGNOSIS — Q909 Down syndrome, unspecified: Secondary | ICD-10-CM | POA: Diagnosis not present

## 2019-12-26 DIAGNOSIS — Z23 Encounter for immunization: Secondary | ICD-10-CM | POA: Diagnosis not present

## 2020-01-04 DIAGNOSIS — Z6838 Body mass index (BMI) 38.0-38.9, adult: Secondary | ICD-10-CM | POA: Diagnosis not present

## 2020-01-04 DIAGNOSIS — Q909 Down syndrome, unspecified: Secondary | ICD-10-CM | POA: Diagnosis not present

## 2020-01-04 DIAGNOSIS — F0390 Unspecified dementia without behavioral disturbance: Secondary | ICD-10-CM | POA: Diagnosis not present

## 2020-01-04 DIAGNOSIS — J309 Allergic rhinitis, unspecified: Secondary | ICD-10-CM | POA: Diagnosis not present

## 2020-02-01 DIAGNOSIS — Z6838 Body mass index (BMI) 38.0-38.9, adult: Secondary | ICD-10-CM | POA: Diagnosis not present

## 2020-02-01 DIAGNOSIS — Q909 Down syndrome, unspecified: Secondary | ICD-10-CM | POA: Diagnosis not present

## 2020-02-01 DIAGNOSIS — F0390 Unspecified dementia without behavioral disturbance: Secondary | ICD-10-CM | POA: Diagnosis not present

## 2020-02-01 DIAGNOSIS — B369 Superficial mycosis, unspecified: Secondary | ICD-10-CM | POA: Diagnosis not present

## 2020-02-22 DIAGNOSIS — Z6838 Body mass index (BMI) 38.0-38.9, adult: Secondary | ICD-10-CM | POA: Diagnosis not present

## 2020-02-22 DIAGNOSIS — N760 Acute vaginitis: Secondary | ICD-10-CM | POA: Diagnosis not present

## 2020-02-22 DIAGNOSIS — Q909 Down syndrome, unspecified: Secondary | ICD-10-CM | POA: Diagnosis not present

## 2020-02-22 DIAGNOSIS — F0390 Unspecified dementia without behavioral disturbance: Secondary | ICD-10-CM | POA: Diagnosis not present

## 2020-03-01 DIAGNOSIS — R52 Pain, unspecified: Secondary | ICD-10-CM | POA: Diagnosis not present

## 2020-03-01 DIAGNOSIS — M79632 Pain in left forearm: Secondary | ICD-10-CM | POA: Diagnosis not present

## 2020-03-01 DIAGNOSIS — M7989 Other specified soft tissue disorders: Secondary | ICD-10-CM | POA: Diagnosis not present

## 2020-03-01 DIAGNOSIS — M79642 Pain in left hand: Secondary | ICD-10-CM | POA: Diagnosis not present

## 2020-03-04 ENCOUNTER — Other Ambulatory Visit: Payer: Self-pay

## 2020-03-04 ENCOUNTER — Emergency Department (HOSPITAL_BASED_OUTPATIENT_CLINIC_OR_DEPARTMENT_OTHER)
Admission: EM | Admit: 2020-03-04 | Discharge: 2020-03-04 | Disposition: A | Discharge status: Home / Self Care | Payer: Medicare Other | Attending: Emergency Medicine | Admitting: Emergency Medicine

## 2020-03-04 ENCOUNTER — Emergency Department (HOSPITAL_BASED_OUTPATIENT_CLINIC_OR_DEPARTMENT_OTHER): Payer: Medicare Other

## 2020-03-04 DIAGNOSIS — Z79899 Other long term (current) drug therapy: Secondary | ICD-10-CM | POA: Diagnosis not present

## 2020-03-04 DIAGNOSIS — S43015A Anterior dislocation of left humerus, initial encounter: Secondary | ICD-10-CM | POA: Diagnosis not present

## 2020-03-04 DIAGNOSIS — G40909 Epilepsy, unspecified, not intractable, without status epilepticus: Secondary | ICD-10-CM | POA: Insufficient documentation

## 2020-03-04 DIAGNOSIS — Y999 Unspecified external cause status: Secondary | ICD-10-CM | POA: Diagnosis not present

## 2020-03-04 DIAGNOSIS — Y929 Unspecified place or not applicable: Secondary | ICD-10-CM | POA: Insufficient documentation

## 2020-03-04 DIAGNOSIS — M24412 Recurrent dislocation, left shoulder: Secondary | ICD-10-CM | POA: Insufficient documentation

## 2020-03-04 DIAGNOSIS — Y939 Activity, unspecified: Secondary | ICD-10-CM | POA: Insufficient documentation

## 2020-03-04 DIAGNOSIS — G309 Alzheimer's disease, unspecified: Secondary | ICD-10-CM | POA: Insufficient documentation

## 2020-03-04 DIAGNOSIS — Q909 Down syndrome, unspecified: Secondary | ICD-10-CM | POA: Insufficient documentation

## 2020-03-04 DIAGNOSIS — X58XXXA Exposure to other specified factors, initial encounter: Secondary | ICD-10-CM | POA: Insufficient documentation

## 2020-03-04 DIAGNOSIS — S60522A Blister (nonthermal) of left hand, initial encounter: Secondary | ICD-10-CM | POA: Diagnosis not present

## 2020-03-04 DIAGNOSIS — F0280 Dementia in other diseases classified elsewhere without behavioral disturbance: Secondary | ICD-10-CM | POA: Insufficient documentation

## 2020-03-04 MED ORDER — DOXYCYCLINE HYCLATE 100 MG PO CAPS: 100.0000 mg | ORAL_CAPSULE | Freq: Two times a day (BID) | ORAL | 0 refills | Status: DC

## 2020-03-04 MED ORDER — DOXYCYCLINE HYCLATE 100 MG PO TABS
100.0000 mg | ORAL_TABLET | Freq: Once | ORAL | Status: AC
  Administered 2020-03-04: 100 mg via ORAL
  Filled 2020-03-04: qty 1

## 2020-03-04 NOTE — Discharge Instructions (Addendum)
Blisters may be due to bacterial hand infection, take doxycycline twice daily for the next week as directed.  Please have facility doctor check the hand again in 2 days to look for any worsening.  If she develops fevers, blistering over larger portions of her body or any other new or concerning symptoms may return to the emergency department  Patient with chronic left shoulder dislocation, follow-up with orthopedics recommended.

## 2020-03-04 NOTE — ED Provider Notes (Signed)
[facility] EMERGENCY DEPARTMENT Provider Note   CSN: [id number] Arrival date & time: 03/04/20  1434     History Chief Complaint  Patient presents with  . Wound Check    Jasmine Ramirez is a 54 y.o. female.  Jasmine Ramirez is a 54 y.o. female with history of Down syndrome, Alzheimer's, seizures, who presents to the emergency department for evaluation of blisters noted to the left hand.  Patient resides in a group home, but is accompanied to the emergency department by her sister who provides majority of the history.  She reports group home told her that on Thursday she had 1 blister to her left hand, but today she is noted to have multiple blisters to the hand of varying sizes as well as a red area around her right eye that facility staff reports was a blister that opened.  No redness or drainage reported from the eye itself.  Patient sister also reports that one of the staff members mentioned that she was leaving the patient has a left shoulder dislocation, sister is unsure how this happened or how old this dislocation is, and has been unable to get more information from staff at this time.  She denies any fevers, does not have any blisters noted elsewhere on the body, and otherwise patient has been active and acting at baseline.  She has periods of confusion where she often growls or yells, and this is typical for the patient.  Level 5 caveat: Dementia        Past Medical History:  Diagnosis Date  . Allergy 06/11/2018  . Chicken pox 06/11/2018  . Down syndrome 06/11/2018  . Heart murmur 06/11/2018    Patient Active Problem List   Diagnosis Date Noted  . Seizure (HCC) 08/26/2018  . VSD (ventricular septal defect), perimembranous   . Hyperextension injury of cervical spine, initial encounter 06/11/2018  . Alzheimer's disease with early onset (HCC) 06/11/2018  . Spinal cord injury to cervical region without bone injury (HCC) 06/11/2018  . Well adult exam  10/09/2015  . Abnormal TSH 10/09/2015  . Rash and nonspecific skin eruption 10/09/2015  . Depression 02/27/2014  . Down's syndrome 12/14/2013    Past Surgical History:  Procedure Laterality Date  . KNEE SURGERY  2013  . RADIOLOGY WITH ANESTHESIA N/A 06/12/2018   Procedure: MRI WITH ANESTHESIA;  Surgeon: Radiologist, Medication, MD;  Location: [facility] OR;  Service: Radiology;  Laterality: N/A;     OB History    Gravida  0   Para  0   Term  0   Preterm  0   AB  0   Living  0     SAB  0   TAB  0   Ectopic  0   Multiple  0   Live Births           Obstetric Comments  n/a        Family History  Problem Relation Age of Onset  . Cancer Father        Prostate  . Diabetes Father   . Cancer Maternal Grandmother        Breast  . Cancer Paternal Grandfather        Colon    Social History   Tobacco Use  . Smoking status: Never Smoker  . Smokeless tobacco: Never Used  Substance Use Topics  . Alcohol use: Never  . Drug use: Never    Home Medications Prior to Admission medications   Medication Sig  Start Date End Date Taking? Authorizing Provider  acetaminophen (TYLENOL) 325 MG tablet Take 650 mg by mouth 3 (three) times daily. Knee pain    [provider]  Calcium Carbonate-Vitamin D (CALCIUM 500/D PO) Take 500 mg by mouth 2 (two) times daily.     [provider]  carbamide peroxide (DEBROX) 6.5 % OTIC solution Place 5 drops into the right ear 2 (two) times daily. For 4 days for ear wax build up    [provider]  cephALEXin (KEFLEX) 500 MG capsule Take 500 mg by mouth 3 (three) times daily. For 7 days for left foot    [provider]  Cholecalciferol (VITAMIN D3) 2000 units TABS Take 2,000 Units by mouth daily.    [provider]  clotrimazole (LOTRIMIN) 1 % cream Apply 1 application topically 2 (two) times daily. Apply to groin for 14 days    [provider]  dexamethasone (DECADRON) 2 MG tablet Take one  tablet twice a day for one day then one tablet  A day for one day. Patient not taking: Reported on 08/16/2018 06/19/18   Regalado, Jon Billings A, MD  doxycycline (VIBRAMYCIN) 100 MG capsule Take 1 capsule (100 mg total) by mouth 2 (two) times daily. One po bid x 7 days 03/04/20   Dartha Lodge, PA-C  levETIRAcetam (KEPPRA) 500 MG tablet Take 1 tablet (500 mg total) by mouth 2 (two) times daily. 08/27/18 09/26/18  Rehman, Areeg N, DO  levETIRAcetam (KEPPRA) 500 MG tablet Take 1 tablet (500 mg total) by mouth 2 (two) times daily. 09/27/18 10/27/18  Rehman, Areeg N, DO  levETIRAcetam (KEPPRA) 500 MG tablet Take 1 tablet (500 mg total) by mouth 2 (two) times daily. 09/25/18 10/25/18  Rehman, Areeg N, DO  levETIRAcetam (KEPPRA) 500 MG tablet Take 1 tablet (500 mg total) by mouth 2 (two) times daily. 09/25/18 10/25/18  Rehman, Areeg N, DO  levothyroxine (SYNTHROID, LEVOTHROID) 25 MCG tablet Take 25 mcg by mouth daily before breakfast.    [provider]  LORazepam (ATIVAN) 1 MG tablet Take 1 mg by mouth daily as needed. 02/16/20   [provider]  Melatonin 3 MG TABS Take 3 mg by mouth at bedtime.    [provider]  omeprazole (PRILOSEC) 40 MG capsule Take 40 mg by mouth daily. For 14 days    [provider]  pantoprazole (PROTONIX) 40 MG tablet Take 1 tablet (40 mg total) by mouth daily. Patient not taking: Reported on 08/16/2018 06/19/18   Regalado, Jon Billings A, MD  polyethylene glycol (MIRALAX / GLYCOLAX) packet Take 17 g by mouth daily as needed for mild constipation. Patient not taking: Reported on 08/16/2018 06/19/18   Alba Cory, MD    Allergies    Patient has no known allergies.  Review of Systems   Review of Systems  Unable to perform ROS: Dementia    Physical Exam Updated Vital Signs BP 111/86 (BP Location: Right Wrist)   Pulse (!) 45   Temp (!) 95.9 F (35.5 C) (Tympanic)   Resp 18   Wt 77.1 kg   SpO2 100%   BMI 37.09 kg/m   Physical Exam Vitals and  nursing note reviewed.  Constitutional:      General: She is not in acute distress.    Appearance: She is well-developed. She is not ill-appearing or diaphoretic.     Comments: Well-appearing and in no distress, agitated and yelling intermittently, which is patient's reported baseline  HENT:  Head: Normocephalic and atraumatic.     Mouth/Throat:     Mouth: Mucous membranes are moist.  Eyes:     General:        Right eye: No discharge.        Left eye: No discharge.     Comments: Erythema over the skin of the right upper eyelid, exam is limited due to patient cooperation but no conjunctival injection or eye discharge noted  Pulmonary:     Effort: Pulmonary effort is normal. No respiratory distress.  Musculoskeletal:     Cervical back: Neck supple.     Comments: Left shoulder with apparent but no tenderness, swelling or discomfort.  Patient is able to push off and bear weight with this arm without discomfort.  Skin:    Comments: Multiple blisters noted to the left hand as pictured below, there is 1 blister to the index finger that appears to be open and healing with a scab, no surrounding erythema or signs of cellulitis, no purulent drainage, no blistering or scabbed lesions noted elsewhere on the extremities or trunk.  Neurological:     Mental Status: She is alert. Mental status is at baseline.     Coordination: Coordination normal.     ED Results / Procedures / Treatments   Labs (all labs ordered are listed, but only abnormal results are displayed) Labs Reviewed - No data to display  EKG None  Radiology DG Shoulder Left Portable  Result Date: 03/04/2020 CLINICAL DATA:  Left shoulder pain. No known injury. EXAM: LEFT SHOULDER COMPARISON:  None. FINDINGS: Anterior dislocation of proximal left humerus relative to the glenoid is noted. 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 No definite fracture is noted. IMPRESSION: Anterior dislocation of proximal left  humerus. Electronically Signed   By: Marijo Conception M.D.   On: 03/04/2020 16:02    Procedures Procedures (including critical care time)  Medications Ordered in ED Medications  doxycycline (VIBRA-TABS) tablet 100 mg (100 mg Oral Given 03/04/20 1607)    ED Course  I have reviewed the triage vital signs and the nursing notes.  Pertinent labs & imaging results that were available during my care of the patient were reviewed by me and considered in my medical decision making (see chart for details).    MDM Rules/Calculators/A&P                     54 year old female with history of Down syndrome, dementia and behavioral disturbance, who presents from group home accompanied by her sister for evaluation of blistering noted to the left hand.  Started with 1 lesion on the index finger but now has several blisters noted to the hand and dorsal surface of the wrist.  She also had one blistered lesion over the right eye which has since burst and now just has redness, no conjunctival erythema or purulent discharge to suggest intraocular infection, no other blistering lesions noted.  Sister also stated that staff mentioned a left shoulder dislocation to her which she was not aware of, unsure of the chronicity of this, left lower extremity is neurovascularly intact.  She is nontender to palpation over the shoulder and I suspect this is more chronic, x-ray does confirm an anterior dislocation, will attempt to obtain more information regarding this from facility staff.  We will plan to treat blistering lesions with course of doxycycline and have facility physician reassess this in 2 days for improvement.  Patient does not have any associated skin  sloughing and does not have any mucosal lesions noted on exam to suggest more severe etiology of blisters at this time.  After speaking with facility staff they found documentation in their system that confirms this dislocation is chronic she was seen last year for  decreased use of her left upper extremity and was found to have a dislocation of unknown chronicity at that time, orthopedics was consulted and did not recommend intervention as they felt and patient state this would do more harm than good.  Patient does not seem to have any pain over this arm today.  Will provide information for orthopedic follow-up but do not think that acute reduction is indicated given chronic dislocation.  Discussed this plan with patient's sister who is in agreement with plan.  Given first dose and discharged with prescription for doxycycline.  Return precautions discussed.  Discharged home in good condition.  Patient discussed with Dr. Stevie Kern, who saw patient as well and agrees with plan.  Final Clinical Impression(s) / ED Diagnoses Final diagnoses:  Blister of left hand, initial encounter  Chronic dislocation of left shoulder    Rx / DC Orders ED Discharge Orders         Ordered    doxycycline (VIBRAMYCIN) 100 MG capsule  2 times daily     03/04/20 1725           Dartha Lodge, [place] 03/04/20 1741    Milagros Loll, MD 03/05/20 [id number]

## 2020-03-04 NOTE — ED Notes (Signed)
Spoke with LaToya at [facility] regarding ortho consult  

## 2020-03-04 NOTE — ED Triage Notes (Signed)
Pt brought in by her sister who is her guardian. She lives in a group home and has a hx of down's syndrome and dementia. Pt's sister reports the group home told her pt had a blister on her left hand on Thursday. Today multiple blisters are present and she also has an area around her right eye. Pt moaning and making growling noises at staff during VS assessment. Pt's sister states this is baseline behavior for her

## 2020-03-07 DIAGNOSIS — R0989 Other specified symptoms and signs involving the circulatory and respiratory systems: Secondary | ICD-10-CM | POA: Diagnosis not present

## 2020-03-07 DIAGNOSIS — F0390 Unspecified dementia without behavioral disturbance: Secondary | ICD-10-CM | POA: Diagnosis not present

## 2020-03-07 DIAGNOSIS — R7982 Elevated C-reactive protein (CRP): Secondary | ICD-10-CM | POA: Diagnosis not present

## 2020-03-07 DIAGNOSIS — Q909 Down syndrome, unspecified: Secondary | ICD-10-CM | POA: Diagnosis not present

## 2020-03-07 DIAGNOSIS — R700 Elevated erythrocyte sedimentation rate: Secondary | ICD-10-CM | POA: Diagnosis not present

## 2020-03-07 DIAGNOSIS — R05 Cough: Secondary | ICD-10-CM | POA: Diagnosis not present

## 2020-03-07 DIAGNOSIS — R6889 Other general symptoms and signs: Secondary | ICD-10-CM | POA: Diagnosis not present

## 2020-03-07 DIAGNOSIS — L139 Bullous disorder, unspecified: Secondary | ICD-10-CM | POA: Diagnosis not present

## 2020-03-07 DIAGNOSIS — L120 Bullous pemphigoid: Secondary | ICD-10-CM | POA: Diagnosis not present

## 2020-03-14 DIAGNOSIS — Z79899 Other long term (current) drug therapy: Secondary | ICD-10-CM | POA: Diagnosis not present

## 2020-03-14 DIAGNOSIS — G4700 Insomnia, unspecified: Secondary | ICD-10-CM | POA: Diagnosis not present

## 2020-03-14 DIAGNOSIS — G259 Extrapyramidal and movement disorder, unspecified: Secondary | ICD-10-CM | POA: Diagnosis not present

## 2020-03-14 DIAGNOSIS — F79 Unspecified intellectual disabilities: Secondary | ICD-10-CM | POA: Diagnosis not present

## 2020-03-14 DIAGNOSIS — F419 Anxiety disorder, unspecified: Secondary | ICD-10-CM | POA: Diagnosis not present

## 2020-03-14 DIAGNOSIS — Q909 Down syndrome, unspecified: Secondary | ICD-10-CM | POA: Diagnosis not present

## 2020-03-14 DIAGNOSIS — Z6836 Body mass index (BMI) 36.0-36.9, adult: Secondary | ICD-10-CM | POA: Diagnosis not present

## 2020-03-21 DIAGNOSIS — F0390 Unspecified dementia without behavioral disturbance: Secondary | ICD-10-CM | POA: Diagnosis not present

## 2020-03-21 DIAGNOSIS — Z6836 Body mass index (BMI) 36.0-36.9, adult: Secondary | ICD-10-CM | POA: Diagnosis not present

## 2020-03-21 DIAGNOSIS — Q909 Down syndrome, unspecified: Secondary | ICD-10-CM | POA: Diagnosis not present

## 2020-03-21 DIAGNOSIS — G40919 Epilepsy, unspecified, intractable, without status epilepticus: Secondary | ICD-10-CM | POA: Diagnosis not present

## 2020-03-22 DIAGNOSIS — Q909 Down syndrome, unspecified: Secondary | ICD-10-CM | POA: Diagnosis not present

## 2020-03-22 DIAGNOSIS — G40919 Epilepsy, unspecified, intractable, without status epilepticus: Secondary | ICD-10-CM | POA: Diagnosis not present

## 2020-03-22 DIAGNOSIS — F0390 Unspecified dementia without behavioral disturbance: Secondary | ICD-10-CM | POA: Diagnosis not present

## 2020-03-22 DIAGNOSIS — Z6834 Body mass index (BMI) 34.0-34.9, adult: Secondary | ICD-10-CM | POA: Diagnosis not present

## 2020-03-24 ENCOUNTER — Encounter (HOSPITAL_BASED_OUTPATIENT_CLINIC_OR_DEPARTMENT_OTHER): Payer: Self-pay | Admitting: Emergency Medicine

## 2020-03-24 ENCOUNTER — Emergency Department (HOSPITAL_BASED_OUTPATIENT_CLINIC_OR_DEPARTMENT_OTHER): Payer: Medicare Other

## 2020-03-24 ENCOUNTER — Other Ambulatory Visit: Payer: Self-pay

## 2020-03-24 ENCOUNTER — Emergency Department (HOSPITAL_BASED_OUTPATIENT_CLINIC_OR_DEPARTMENT_OTHER)
Admission: EM | Admit: 2020-03-24 | Discharge: 2020-03-24 | Disposition: A | Discharge status: Home / Self Care | Payer: Medicare Other | Source: Home / Self Care | Attending: Emergency Medicine | Admitting: Emergency Medicine

## 2020-03-24 DIAGNOSIS — R4182 Altered mental status, unspecified: Secondary | ICD-10-CM | POA: Diagnosis not present

## 2020-03-24 DIAGNOSIS — S0990XA Unspecified injury of head, initial encounter: Secondary | ICD-10-CM | POA: Diagnosis not present

## 2020-03-24 DIAGNOSIS — G9349 Other encephalopathy: Secondary | ICD-10-CM | POA: Diagnosis not present

## 2020-03-24 DIAGNOSIS — I959 Hypotension, unspecified: Secondary | ICD-10-CM | POA: Diagnosis not present

## 2020-03-24 DIAGNOSIS — R531 Weakness: Secondary | ICD-10-CM | POA: Diagnosis not present

## 2020-03-24 DIAGNOSIS — Y999 Unspecified external cause status: Secondary | ICD-10-CM | POA: Insufficient documentation

## 2020-03-24 DIAGNOSIS — R220 Localized swelling, mass and lump, head: Secondary | ICD-10-CM | POA: Diagnosis not present

## 2020-03-24 DIAGNOSIS — G309 Alzheimer's disease, unspecified: Secondary | ICD-10-CM | POA: Diagnosis not present

## 2020-03-24 DIAGNOSIS — R9431 Abnormal electrocardiogram [ECG] [EKG]: Secondary | ICD-10-CM | POA: Diagnosis not present

## 2020-03-24 DIAGNOSIS — W19XXXA Unspecified fall, initial encounter: Secondary | ICD-10-CM | POA: Insufficient documentation

## 2020-03-24 DIAGNOSIS — Z20822 Contact with and (suspected) exposure to covid-19: Secondary | ICD-10-CM | POA: Diagnosis not present

## 2020-03-24 DIAGNOSIS — Q909 Down syndrome, unspecified: Secondary | ICD-10-CM | POA: Insufficient documentation

## 2020-03-24 DIAGNOSIS — R0902 Hypoxemia: Secondary | ICD-10-CM | POA: Diagnosis not present

## 2020-03-24 DIAGNOSIS — Y929 Unspecified place or not applicable: Secondary | ICD-10-CM | POA: Insufficient documentation

## 2020-03-24 DIAGNOSIS — S0083XA Contusion of other part of head, initial encounter: Secondary | ICD-10-CM | POA: Insufficient documentation

## 2020-03-24 DIAGNOSIS — Y939 Activity, unspecified: Secondary | ICD-10-CM | POA: Insufficient documentation

## 2020-03-24 DIAGNOSIS — J984 Other disorders of lung: Secondary | ICD-10-CM | POA: Diagnosis not present

## 2020-03-24 DIAGNOSIS — G4733 Obstructive sleep apnea (adult) (pediatric): Secondary | ICD-10-CM | POA: Diagnosis not present

## 2020-03-24 DIAGNOSIS — Q210 Ventricular septal defect: Secondary | ICD-10-CM | POA: Diagnosis not present

## 2020-03-24 DIAGNOSIS — F0281 Dementia in other diseases classified elsewhere with behavioral disturbance: Secondary | ICD-10-CM | POA: Diagnosis not present

## 2020-03-24 HISTORY — DX: Unspecified intellectual disabilities: F79

## 2020-03-24 HISTORY — DX: Myopia, bilateral: H52.13

## 2020-03-24 NOTE — ED Provider Notes (Signed)
[facility] EMERGENCY DEPARTMENT Provider Note   CSN: [id number] Arrival date & time: 03/24/20  1707     History Chief Complaint  Patient presents with   Head Injury    Jasmine Ramirez is a 54 y.o. female.  Patient here from group home with history of intellectual disability and Down syndrome presents after an unwitnessed fall.  Patient is here with her mother who is visiting from [place].  Mother states that she saw the patient earlier today and she was very drowsy.  Patient presents with 2 hematomas to her right forehead.  Mother reports that patient's medications were recently changed and she was placed on medicine which make her more sedated due to agitation from a different medication.  She is awake now and recognizes her mother.  No anticoagulation.  Level 5 caveat due to nonverbal.        Past Medical History:  Diagnosis Date   Allergy 06/11/2018   Chicken pox 06/11/2018   Down syndrome 06/11/2018   Heart murmur 06/11/2018   Intellectual disability    Myopia of both eyes     Patient Active Problem List   Diagnosis Date Noted   Seizure (HCC) 08/26/2018   VSD (ventricular septal defect), perimembranous    Hyperextension injury of cervical spine, initial encounter 06/11/2018   Alzheimer's disease with early onset (HCC) 06/11/2018   Spinal cord injury to cervical region without bone injury (HCC) 06/11/2018   Well adult exam 10/09/2015   Abnormal TSH 10/09/2015   Rash and nonspecific skin eruption 10/09/2015   Depression 02/27/2014   Down's syndrome 12/14/2013    Past Surgical History:  Procedure Laterality Date   KNEE SURGERY  2013   RADIOLOGY WITH ANESTHESIA N/A 06/12/2018   Procedure: MRI WITH ANESTHESIA;  Surgeon: Radiologist, Medication, MD;  Location: [facility] OR;  Service: Radiology;  Laterality: N/A;     OB History    Gravida  0   Para  0   Term  0   Preterm  0   AB  0   Living  0     SAB  0   TAB  0   Ectopic  0   Multiple  0   Live Births           Obstetric Comments  n/a        Family History  Problem Relation Age of Onset   Cancer Father        Prostate   Diabetes Father    Cancer Maternal Grandmother        Breast   Cancer Paternal Grandfather        Colon    Social History   Tobacco Use   Smoking status: Never Smoker   Smokeless tobacco: Never Used  Substance Use Topics   Alcohol use: Never   Drug use: Never    Home Medications Prior to Admission medications   Medication Sig Start Date End Date Taking? Authorizing Provider  acetaminophen (TYLENOL) 325 MG tablet Take 650 mg by mouth 3 (three) times daily. Knee pain    [provider]  Calcium Carbonate-Vitamin D (CALCIUM 500/D PO) Take 500 mg by mouth 2 (two) times daily.     [provider]  carbamide peroxide (DEBROX) 6.5 % OTIC solution Place 5 drops into the right ear 2 (two) times daily. For 4 days for ear wax build up    [provider]  cephALEXin (KEFLEX) 500 MG capsule Take 500 mg by mouth 3 (three)  times daily. For 7 days for left foot    [provider]  Cholecalciferol (VITAMIN D3) 2000 units TABS Take 2,000 Units by mouth daily.    [provider]  clotrimazole (LOTRIMIN) 1 % cream Apply 1 application topically 2 (two) times daily. Apply to groin for 14 days    [provider]  dexamethasone (DECADRON) 2 MG tablet Take one tablet twice a day for one day then one tablet  A day for one day. Patient not taking: Reported on 08/16/2018 06/19/18   Regalado, Jerald Kief A, MD  doxycycline (VIBRAMYCIN) 100 MG capsule Take 1 capsule (100 mg total) by mouth 2 (two) times daily. One po bid x 7 days 03/04/20   Jacqlyn Larsen, PA-C  levETIRAcetam (KEPPRA) 500 MG tablet Take 1 tablet (500 mg total) by mouth 2 (two) times daily. 08/27/18 09/26/18  Rehman, Areeg N, DO  levETIRAcetam (KEPPRA) 500 MG tablet Take 1 tablet (500 mg total) by mouth 2 (two) times daily.  09/27/18 10/27/18  Rehman, Areeg N, DO  levETIRAcetam (KEPPRA) 500 MG tablet Take 1 tablet (500 mg total) by mouth 2 (two) times daily. 09/25/18 10/25/18  Rehman, Areeg N, DO  levETIRAcetam (KEPPRA) 500 MG tablet Take 1 tablet (500 mg total) by mouth 2 (two) times daily. 09/25/18 10/25/18  Rehman, Areeg N, DO  levothyroxine (SYNTHROID, LEVOTHROID) 25 MCG tablet Take 25 mcg by mouth daily before breakfast.    [provider]  LORazepam (ATIVAN) 1 MG tablet Take 1 mg by mouth daily as needed. 02/16/20   [provider]  Melatonin 3 MG TABS Take 3 mg by mouth at bedtime.    [provider]  omeprazole (PRILOSEC) 40 MG capsule Take 40 mg by mouth daily. For 14 days    [provider]  pantoprazole (PROTONIX) 40 MG tablet Take 1 tablet (40 mg total) by mouth daily. Patient not taking: Reported on 08/16/2018 06/19/18   Regalado, Jerald Kief A, MD  polyethylene glycol (MIRALAX / GLYCOLAX) packet Take 17 g by mouth daily as needed for mild constipation. Patient not taking: Reported on 08/16/2018 06/19/18   Elmarie Shiley, MD    Allergies    Patient has no known allergies.  Review of Systems   Review of Systems  Unable to perform ROS: Patient nonverbal    Physical Exam Updated Vital Signs BP (!) 100/58 (BP Location: Right Arm)    Pulse 73    Temp (!) 96.8 F (36 C) (Oral)    Resp 20    Ht 4\' 8"  (1.422 m)    Wt 77.1 kg    SpO2 100%    BMI 38.11 kg/m   Physical Exam Vitals and nursing note reviewed.  Constitutional:      Appearance: She is well-developed.  HENT:     Head: Normocephalic. No raccoon eyes or Battle's sign.     Comments: Two distinct frontal hematomas R forehead    Right Ear: Tympanic membrane, ear canal and external ear normal. No hemotympanum.     Left Ear: Tympanic membrane, ear canal and external ear normal. No hemotympanum.     Nose: Nose normal.     Mouth/Throat:     Pharynx: Uvula midline.  Eyes:     General: Lids are normal.     Extraocular  Movements:     Right eye: No nystagmus.     Left eye: No nystagmus.     Conjunctiva/sclera: Conjunctivae normal.     Pupils: Pupils are equal, round, and  reactive to light.     Comments: No visible hyphema noted  Cardiovascular:     Rate and Rhythm: Normal rate and regular rhythm.  Pulmonary:     Effort: Pulmonary effort is normal.     Breath sounds: Normal breath sounds.  Abdominal:     Palpations: Abdomen is soft.     Tenderness: There is no abdominal tenderness.  Musculoskeletal:     Cervical back: No bony tenderness.     Thoracic back: No tenderness or bony tenderness.     Lumbar back: No tenderness or bony tenderness.     Comments: I am able to passively range patient's arms and legs without her acting like she in pain. I do not see any objective findings of trauma.   Skin:    General: Skin is warm and dry.  Neurological:     Mental Status: She is alert. Mental status is at baseline.     GCS: GCS eye subscore is 4. GCS verbal subscore is 5. GCS motor subscore is 6.     Deep Tendon Reflexes: Reflexes are normal and symmetric.     Comments: Patient mostly non-verbal. She does say 'mom' when she sees her mother at bedside. Does not follow commands.      ED Results / Procedures / Treatments   Labs (all labs ordered are listed, but only abnormal results are displayed) Labs Reviewed - No data to display  EKG None  Radiology CT Head Wo Contrast  Result Date: 03/24/2020 CLINICAL DATA:  Status post fall. EXAM: CT HEAD WITHOUT CONTRAST TECHNIQUE: Contiguous axial images were obtained from the base of the skull through the vertex without intravenous contrast. COMPARISON:  August 16, 2018 FINDINGS: Brain: There is moderate severity cerebral atrophy with widening of the extra-axial spaces and ventricular dilatation. There are areas of decreased attenuation within the white matter tracts of the supratentorial brain, consistent with microvascular disease changes. There is bilateral  symmetric basal ganglia calcification. Vascular: No hyperdense vessels are identified. Skull: Normal. Negative for fracture or focal lesion. Sinuses/Orbits: No acute finding. Other: There is mild right periorbital and mild right frontal scalp soft tissue swelling. IMPRESSION: 1. Mild right periorbital and mild right frontal scalp soft tissue swelling. 2. No acute intracranial abnormality. Electronically Signed   By: Aram Candela M.D.   On: 03/24/2020 18:37   CT Cervical Spine Wo Contrast  Result Date: 03/24/2020 CLINICAL DATA:  Status post fall with altered mental status. EXAM: CT CERVICAL SPINE WITHOUT CONTRAST TECHNIQUE: Multidetector CT imaging of the cervical spine was performed without intravenous contrast. Multiplanar CT image reconstructions were also generated. COMPARISON:  June 11, 2018 FINDINGS: Please note that the study is severely limited due to motion artifact, despite of repeated attempts. Alignment: Minimal retrolistheses of C2 on C3 and minimal anterolisthesis of C4 on C5. Reversal of cervical lordosis. Skull base and vertebrae: No evidence of fracture, accounting for the limitations of this study. Soft tissues and spinal canal: No prevertebral fluid or swelling. No visible canal hematoma. Disc levels: Multilevel osteoarthritic changes and posterior facet arthropathy. Upper chest: Negative. Other: None. IMPRESSION: 1. Please note that this study is severely limited due to motion artifact, despite of repeated scanning attempts. No evidence of fracture, accounting for these limitations. 2. Minimal chronic retrolistheses of C2 on C3 and minimal chronic anterolisthesis of C4 on C5, degenerative in etiology. 3. Multilevel osteoarthritic changes and posterior facet arthropathy. Electronically Signed   By: Ted Mcalpine M.D.   On: 03/24/2020 18:20  Procedures Procedures (including critical care time)  Medications Ordered in ED Medications - No data to display  ED Course  I have  reviewed the triage vital signs and the nursing notes.  Pertinent labs & imaging results that were available during my care of the patient were reviewed by me and considered in my medical decision making (see chart for details).  Patient seen and examined. Head/neck imaging ordered.   Vital signs reviewed and are as follows: BP (!) 100/58 (BP Location: Right Arm)    Pulse 73    Temp (!) 96.8 F (36 C) (Oral)    Resp 20    Ht 4\' 8"  (1.422 m)    Wt 77.1 kg    SpO2 100%    BMI 38.11 kg/m    7:27 PM patient reevaluated.  Neck roll removed by myself.  Patient is moving neck comfortably.  On reexam, patient is not using her left arm very much.  Mother confirmed with sister that this arm is not very functional at baseline.  Doubt acute injury.  Mother states that Betul is as responsive as she has been over the past 2 days.  She is awake and alert at the current time.  Plan for discharge home.  She may have excessive drowsiness due to some recent medication changes.  Encouraged follow-up with the doctor who prescribes these.   MDM Rules/Calculators/A&P                      Patient with forehead contusion after an unwitnessed fall today.  She is otherwise at her baseline given history of CP.  Imaging of the head and cervical spine are negative.  Plan for discharge home.    Final Clinical Impression(s) / ED Diagnoses Final diagnoses:  Contusion of other part of head, initial encounter    Rx / DC Orders ED Discharge Orders    None       Victorino Dike, PA-C 03/24/20 1930    05/24/20, MD 03/26/20 [id number]

## 2020-03-24 NOTE — ED Triage Notes (Addendum)
From Group home. Pt fell PTA. Around 1515. Unwitnessed, Hematoma Right forehead. Pt's Mother is with her and states "new" medicine makes her sleepy.

## 2020-03-24 NOTE — ED Notes (Signed)
Spoke with Group Home Care taker Windell Moulding, states pt has been .very drowsy, and sleepy since starting on Clonazepam 5/1.

## 2020-03-24 NOTE — Discharge Instructions (Signed)
Please read and follow all provided instructions.  Your diagnoses today include:  1. Contusion of other part of head, initial encounter     Tests performed today include:  CT scan of your head and neck that did not show any serious injury.  Vital signs. See below for your results today.   Medications prescribed:   None  Take any prescribed medications only as directed.  Home care instructions:  Follow any educational materials contained in this packet.  BE VERY CAREFUL not to take multiple medicines containing Tylenol (also called acetaminophen). Doing so can lead to an overdose which can damage your liver and cause liver failure and possibly death.   Follow-up instructions: Please follow-up with your primary care provider in the next 3 days for further evaluation of your symptoms, especially if excessive sleepiness is occurring.   Return instructions:  SEEK IMMEDIATE MEDICAL ATTENTION IF:  There is confusion or drowsiness (although children frequently become drowsy after injury).   You cannot awaken the injured person.   You have more than one episode of vomiting.   You notice dizziness or unsteadiness which is getting worse.  You have convulsions or unconsciousness.   You experience severe, persistent headaches not relieved by Tylenol.  You cannot use arms or legs as usual.   There are changes in pupil sizes. (This is the black center in the colored part of the eye)   There is clear or bloody discharge from the nose or ears.   You have change in speech, vision, swallowing, or understanding.   Localized weakness, numbness, tingling, or change in bowel or bladder control.  You have any other emergent concerns.  Additional Information: You have had a head injury which does not appear to require admission at this time.  Your vital signs today were: BP (!) 100/58 (BP Location: Right Arm)   Pulse 73   Temp (!) 96.8 F (36 C) (Oral)   Resp 20   Ht 4\' 8"  (1.422  m)   Wt 77.1 kg   SpO2 100%   BMI 38.11 kg/m  If your blood pressure (BP) was elevated above 135/85 this visit, please have this repeated by your doctor within one month. --------------

## 2020-03-24 NOTE — ED Notes (Signed)
10/08/2105 

## 2020-03-24 NOTE — ED Notes (Signed)
Pt to CT scan.

## 2020-03-25 ENCOUNTER — Encounter (HOSPITAL_COMMUNITY): Payer: Self-pay

## 2020-03-25 ENCOUNTER — Other Ambulatory Visit: Payer: Self-pay

## 2020-03-25 ENCOUNTER — Inpatient Hospital Stay (HOSPITAL_COMMUNITY)
Admission: EM | Admit: 2020-03-25 | Discharge: 2020-04-04 | DRG: 071 | Disposition: A | Discharge status: Skilled Nursing Facility | Payer: Medicare Other | Attending: Family Medicine | Admitting: Family Medicine

## 2020-03-25 ENCOUNTER — Emergency Department (HOSPITAL_COMMUNITY): Payer: Medicare Other

## 2020-03-25 DIAGNOSIS — W19XXXA Unspecified fall, initial encounter: Secondary | ICD-10-CM | POA: Diagnosis not present

## 2020-03-25 DIAGNOSIS — F0281 Dementia in other diseases classified elsewhere with behavioral disturbance: Secondary | ICD-10-CM | POA: Diagnosis present

## 2020-03-25 DIAGNOSIS — G309 Alzheimer's disease, unspecified: Secondary | ICD-10-CM | POA: Diagnosis not present

## 2020-03-25 DIAGNOSIS — Z20822 Contact with and (suspected) exposure to covid-19: Secondary | ICD-10-CM | POA: Diagnosis present

## 2020-03-25 DIAGNOSIS — E039 Hypothyroidism, unspecified: Secondary | ICD-10-CM | POA: Diagnosis present

## 2020-03-25 DIAGNOSIS — Z7982 Long term (current) use of aspirin: Secondary | ICD-10-CM

## 2020-03-25 DIAGNOSIS — Z79890 Hormone replacement therapy (postmenopausal): Secondary | ICD-10-CM

## 2020-03-25 DIAGNOSIS — G4733 Obstructive sleep apnea (adult) (pediatric): Secondary | ICD-10-CM | POA: Diagnosis present

## 2020-03-25 DIAGNOSIS — G40909 Epilepsy, unspecified, not intractable, without status epilepticus: Secondary | ICD-10-CM | POA: Diagnosis present

## 2020-03-25 DIAGNOSIS — S0003XA Contusion of scalp, initial encounter: Secondary | ICD-10-CM | POA: Diagnosis present

## 2020-03-25 DIAGNOSIS — W06XXXA Fall from bed, initial encounter: Secondary | ICD-10-CM | POA: Diagnosis present

## 2020-03-25 DIAGNOSIS — F329 Major depressive disorder, single episode, unspecified: Secondary | ICD-10-CM | POA: Diagnosis present

## 2020-03-25 DIAGNOSIS — R531 Weakness: Secondary | ICD-10-CM

## 2020-03-25 DIAGNOSIS — I959 Hypotension, unspecified: Secondary | ICD-10-CM | POA: Diagnosis present

## 2020-03-25 DIAGNOSIS — R001 Bradycardia, unspecified: Secondary | ICD-10-CM | POA: Diagnosis not present

## 2020-03-25 DIAGNOSIS — R0902 Hypoxemia: Secondary | ICD-10-CM | POA: Diagnosis present

## 2020-03-25 DIAGNOSIS — J984 Other disorders of lung: Secondary | ICD-10-CM | POA: Diagnosis not present

## 2020-03-25 DIAGNOSIS — G300 Alzheimer's disease with early onset: Secondary | ICD-10-CM | POA: Diagnosis present

## 2020-03-25 DIAGNOSIS — R4182 Altered mental status, unspecified: Secondary | ICD-10-CM | POA: Diagnosis present

## 2020-03-25 DIAGNOSIS — I10 Essential (primary) hypertension: Secondary | ICD-10-CM | POA: Diagnosis not present

## 2020-03-25 DIAGNOSIS — E785 Hyperlipidemia, unspecified: Secondary | ICD-10-CM | POA: Diagnosis present

## 2020-03-25 DIAGNOSIS — Q909 Down syndrome, unspecified: Secondary | ICD-10-CM

## 2020-03-25 DIAGNOSIS — Q210 Ventricular septal defect: Secondary | ICD-10-CM

## 2020-03-25 DIAGNOSIS — Z79899 Other long term (current) drug therapy: Secondary | ICD-10-CM

## 2020-03-25 DIAGNOSIS — G9349 Other encephalopathy: Principal | ICD-10-CM | POA: Diagnosis present

## 2020-03-25 DIAGNOSIS — Z993 Dependence on wheelchair: Secondary | ICD-10-CM

## 2020-03-25 DIAGNOSIS — Y92003 Bedroom of unspecified non-institutional (private) residence as the place of occurrence of the external cause: Secondary | ICD-10-CM

## 2020-03-25 DIAGNOSIS — R9431 Abnormal electrocardiogram [ECG] [EKG]: Secondary | ICD-10-CM | POA: Diagnosis not present

## 2020-03-25 DIAGNOSIS — S0010XA Contusion of unspecified eyelid and periocular area, initial encounter: Secondary | ICD-10-CM | POA: Diagnosis present

## 2020-03-25 DIAGNOSIS — F79 Unspecified intellectual disabilities: Secondary | ICD-10-CM | POA: Diagnosis present

## 2020-03-25 LAB — CBC WITH DIFFERENTIAL/PLATELET
Abs Immature Granulocytes: 0.03 10*3/uL (ref 0.00–0.07)
Basophils Absolute: 0 10*3/uL (ref 0.0–0.1)
Basophils Relative: 0 %
Eosinophils Absolute: 0.1 10*3/uL (ref 0.0–0.5)
Eosinophils Relative: 0 %
HCT: 40.8 % (ref 36.0–46.0)
Hemoglobin: 13.7 g/dL (ref 12.0–15.0)
Immature Granulocytes: 0 %
Lymphocytes Relative: 11 %
Lymphs Abs: 1.3 10*3/uL (ref 0.7–4.0)
MCH: 32.6 pg (ref 26.0–34.0)
MCHC: 33.6 g/dL (ref 30.0–36.0)
MCV: 97.1 fL (ref 80.0–100.0)
Monocytes Absolute: 0.3 10*3/uL (ref 0.1–1.0)
Monocytes Relative: 3 %
Neutro Abs: 10.3 10*3/uL — ABNORMAL HIGH (ref 1.7–7.7)
Neutrophils Relative %: 86 %
Platelets: 192 10*3/uL (ref 150–400)
RBC: 4.2 MIL/uL (ref 3.87–5.11)
RDW: 14.2 % (ref 11.5–15.5)
WBC: 12 10*3/uL — ABNORMAL HIGH (ref 4.0–10.5)
nRBC: 0 % (ref 0.0–0.2)

## 2020-03-25 LAB — COMPREHENSIVE METABOLIC PANEL
ALT: 14 U/L (ref 0–44)
AST: 29 U/L (ref 15–41)
Albumin: 2.9 g/dL — ABNORMAL LOW (ref 3.5–5.0)
Alkaline Phosphatase: 73 U/L (ref 38–126)
Anion gap: 7 (ref 5–15)
BUN: 13 mg/dL (ref 6–20)
CO2: 24 mmol/L (ref 22–32)
Calcium: 8.2 mg/dL — ABNORMAL LOW (ref 8.9–10.3)
Chloride: 103 mmol/L (ref 98–111)
Creatinine, Ser: 0.91 mg/dL (ref 0.44–1.00)
GFR calc Af Amer: >60 mL/min (ref >60)
GFR calc non Af Amer: >60 mL/min (ref >60)
Glucose, Bld: 85 mg/dL (ref 70–99)
Potassium: 5.4 mmol/L — ABNORMAL HIGH (ref 3.5–5.1)
Sodium: 134 mmol/L — ABNORMAL LOW (ref 135–145)
Total Bilirubin: 0.9 mg/dL (ref 0.3–1.2)
Total Protein: 5.9 g/dL — ABNORMAL LOW (ref 6.5–8.1)

## 2020-03-25 LAB — LACTIC ACID, PLASMA: Lactic Acid, Venous: 1.3 mmol/L (ref 0.5–1.9)

## 2020-03-25 LAB — PROTIME-INR
INR: 1.1 (ref 0.8–1.2)
Prothrombin Time: 14.2 seconds (ref 11.4–15.2)

## 2020-03-25 LAB — APTT: aPTT: 40 seconds — ABNORMAL HIGH (ref 24–36)

## 2020-03-25 LAB — TSH: TSH: 4.16 u[IU]/mL (ref 0.350–4.500)

## 2020-03-25 MED ORDER — VANCOMYCIN HCL IN DEXTROSE 1-5 GM/200ML-% IV SOLN
1000.0000 mg | Freq: Once | INTRAVENOUS | Status: AC
  Administered 2020-03-26: 1000 mg via INTRAVENOUS
  Filled 2020-03-25: qty 200

## 2020-03-25 MED ORDER — METRONIDAZOLE IN NACL 5-0.79 MG/ML-% IV SOLN
500.0000 mg | Freq: Once | INTRAVENOUS | Status: AC
  Administered 2020-03-26: 500 mg via INTRAVENOUS
  Filled 2020-03-25: qty 100

## 2020-03-25 MED ORDER — SODIUM CHLORIDE 0.9 % IV SOLN
2.0000 g | Freq: Once | INTRAVENOUS | Status: AC
  Administered 2020-03-26: 2 g via INTRAVENOUS
  Filled 2020-03-25: qty 2

## 2020-03-25 MED ORDER — LACTATED RINGERS IV BOLUS
1000.0000 mL | Freq: Once | INTRAVENOUS | Status: AC
  Administered 2020-03-26: 1000 mL via INTRAVENOUS

## 2020-03-25 MED ORDER — LACTATED RINGERS IV BOLUS
1000.0000 mL | Freq: Once | INTRAVENOUS | Status: AC
  Administered 2020-03-25: 1000 mL via INTRAVENOUS

## 2020-03-25 NOTE — ED Notes (Signed)
Warm Blankets Applied

## 2020-03-25 NOTE — ED Triage Notes (Signed)
Pt BIB [facility] from Group Home where pt resides.   Pt had a fall yesterday (03/24/2020) and was assessed at [facility] and was medically cleared.   Pt has since become lethargic today and seems "tired" per staff at group home.   VSS with EMS. Pt is nonverbal at baseline.   CBG 104.

## 2020-03-25 NOTE — ED Provider Notes (Signed)
[facility] EMERGENCY DEPARTMENT Provider Note   CSN: [id number] Arrival date & time: 03/25/20  2023     History Chief Complaint  Patient presents with  . Fatigue    Jasmine Ramirez is a 54 y.o. female.  HPI Patient is a 54 year old female with history of Down syndrome and Alzheimer's disease, coming from nursing home.  She had a fall yesterday, after which she was seen at [facility].  She underwent CT scanning of head and neck, with no acute findings.  Patient was discharged back to her facility at that time.  Patient presents today for concerns about fatigue and lethargy that have continued. Today, she was slumped over in the chair and unwilling to even sit up.  She is normally active and alert.  At baseline, she yells periodically and has the energy to act out at times.  She was started on Klonopin 10 days ago, 0.5 mg in the mornings.  Her last dose was this morning.  She has had no further medication changes.  Symptoms of fatigue and lethargy have only been present for the past 2 days.  She has not had any noticeable infectious symptoms.    Past Medical History:  Diagnosis Date  . Allergy 06/11/2018  . Chicken pox 06/11/2018  . Down syndrome 06/11/2018  . Heart murmur 06/11/2018  . Intellectual disability   . Myopia of both eyes     Patient Active Problem List   Diagnosis Date Noted  . AMS (altered mental status) 03/26/2020  . Altered mental status 03/26/2020  . Seizure (HCC) 08/26/2018  . VSD (ventricular septal defect), perimembranous   . Hyperextension injury of cervical spine, initial encounter 06/11/2018  . Alzheimer's disease with early onset (HCC) 06/11/2018  . Spinal cord injury to cervical region without bone injury (HCC) 06/11/2018  . Well adult exam 10/09/2015  . Abnormal TSH 10/09/2015  . Rash and nonspecific skin eruption 10/09/2015  . Depression 02/27/2014  . Down's syndrome 12/14/2013    Past Surgical History:    Procedure Laterality Date  . KNEE SURGERY  2013  . RADIOLOGY WITH ANESTHESIA N/A 06/12/2018   Procedure: MRI WITH ANESTHESIA;  Surgeon: Radiologist, Medication, MD;  Location: [facility] OR;  Service: Radiology;  Laterality: N/A;     OB History    Gravida  0   Para  0   Term  0   Preterm  0   AB  0   Living  0     SAB  0   TAB  0   Ectopic  0   Multiple  0   Live Births           Obstetric Comments  n/a        Family History  Problem Relation Age of Onset  . Cancer Father        Prostate  . Diabetes Father   . Cancer Maternal Grandmother        Breast  . Cancer Paternal Grandfather        Colon    Social History   Tobacco Use  . Smoking status: Never Smoker  . Smokeless tobacco: Never Used  Substance Use Topics  . Alcohol use: Never  . Drug use: Never    Home Medications Prior to Admission medications   Medication Sig Start Date End Date Taking? Authorizing Provider  acetaminophen (TYLENOL) 325 MG tablet Take 650 mg by mouth 2 (two) times daily.    Yes [provider]  Acetylcysteine (NAC) 600 MG CAPS Take 600 mg by mouth 2 (two) times daily.   Yes [provider]  aspirin EC 81 MG tablet Take 81 mg by mouth daily.   Yes [provider]  Calcium Carbonate (CALCIUM 600 PO) Take 600 mg by mouth 2 (two) times daily.   Yes [provider]  Cholecalciferol (VITAMIN D3) 2000 units TABS Take 2,000 Units by mouth daily.   Yes [provider]  clonazePAM (KLONOPIN) 0.5 MG tablet Take 0.5 mg by mouth daily. 03/14/20  Yes [provider]  donepezil (ARICEPT) 5 MG tablet Take 5 mg by mouth at bedtime. 03/17/20  Yes [provider]  levETIRAcetam (KEPPRA) 500 MG tablet Take 1 tablet (500 mg total) by mouth 2 (two) times daily. 09/25/18 03/25/20 Yes Rehman, Areeg N, DO  levothyroxine (SYNTHROID) 75 MCG tablet Take 75 mcg by mouth daily before breakfast. 03/17/20  Yes [provider]  melatonin 5 MG TABS  Take 5 mg by mouth at bedtime.   Yes [provider]  memantine (NAMENDA XR) 7 MG CP24 24 hr capsule Take 7 mg by mouth daily. 03/17/20  Yes [provider]  Olopatadine HCl (EYE ALLERGY ITCH RELIEF) 0.2 % SOLN Place 1 drop into both eyes 2 (two) times daily.   Yes [provider]  Omega-3 1000 MG CAPS Take 1,000 mg by mouth 2 (two) times daily.   Yes [provider]  simvastatin (ZOCOR) 20 MG tablet Take 20 mg by mouth every evening. 03/24/20  Yes [provider]  sodium fluoride (DENTA 5000 PLUS) 1.1 % CREA dental cream Place 1 application onto teeth at bedtime.   Yes [provider]  traZODone (DESYREL) 50 MG tablet Take 50 mg by mouth at bedtime. 03/17/20  Yes [provider]  triamcinolone ointment (KENALOG) 0.1 % Apply 1 application topically See admin instructions. Apply topically three times daily until blisters are healed. 03/07/20  Yes [provider]  pantoprazole (PROTONIX) 40 MG tablet Take 1 tablet (40 mg total) by mouth daily. Patient not taking: Reported on 08/16/2018 06/19/18 03/24/20  Alba Cory, MD    Allergies    Patient has no known allergies.  Review of Systems   Review of Systems  Unable to perform ROS: Dementia    Physical Exam Updated Vital Signs BP 91/73   Pulse (!) 48   Temp 97.6 F (36.4 C) (Axillary)   Resp 14   SpO2 93%   Physical Exam Vitals and nursing note reviewed. Exam conducted with a chaperone present.  Constitutional:      General: She is not in acute distress.    Appearance: She is well-developed. She is obese. She is not toxic-appearing or diaphoretic.  HENT:     Head: Normocephalic.     Comments: Mild swelling to forehead    Right Ear: External ear normal.     Left Ear: External ear normal.     Mouth/Throat:     Mouth: Mucous membranes are moist.  Eyes:     Extraocular Movements: Extraocular movements intact.     Conjunctiva/sclera: Conjunctivae normal.      Comments: Periorbital ecchymosis  Cardiovascular:     Rate and Rhythm: Regular rhythm. Bradycardia present.     Heart sounds: No murmur.     Comments: Bradycardic at baseline Pulmonary:     Effort: Pulmonary effort is normal. No respiratory distress.     Breath sounds: Normal breath sounds. No wheezing or rales.  Abdominal:  General: There is no distension.     Palpations: Abdomen is soft.     Tenderness: There is no abdominal tenderness. There is no guarding.  Musculoskeletal:        General: No tenderness, deformity or signs of injury.     Cervical back: Neck supple. No tenderness.     Right lower leg: No edema.     Left lower leg: No edema.  Skin:    General: Skin is warm and dry.  Neurological:     GCS: GCS eye subscore is 4. GCS verbal subscore is 1. GCS motor subscore is 6.     Cranial Nerves: No facial asymmetry.     ED Results / Procedures / Treatments   Labs (all labs ordered are listed, but only abnormal results are displayed) Labs Reviewed  COMPREHENSIVE METABOLIC PANEL - Abnormal; Notable for the following components:      Result Value   Sodium 134 (*)    Potassium 5.4 (*)    Calcium 8.2 (*)    Total Protein 5.9 (*)    Albumin 2.9 (*)    All other components within normal limits  CBC WITH DIFFERENTIAL/PLATELET - Abnormal; Notable for the following components:   WBC 12.0 (*)    Neutro Abs 10.3 (*)    All other components within normal limits  APTT - Abnormal; Notable for the following components:   aPTT 40 (*)    All other components within normal limits  URINALYSIS, ROUTINE W REFLEX MICROSCOPIC - Abnormal; Notable for the following components:   Color, Urine STRAW (*)    All other components within normal limits  COMPREHENSIVE METABOLIC PANEL - Abnormal; Notable for the following components:   Glucose, Bld 114 (*)    Calcium 7.9 (*)    Total Protein 5.1 (*)    Albumin 2.4 (*)    All other components within normal limits  CBC - Abnormal; Notable for  the following components:   RBC 3.70 (*)    All other components within normal limits  RAPID URINE DRUG SCREEN, HOSP PERFORMED - Abnormal; Notable for the following components:   Benzodiazepines POSITIVE (*)    All other components within normal limits  POCT I-STAT EG7 - Abnormal; Notable for the following components:   pO2, Ven 161.0 (*)    Bicarbonate 29.7 (*)    Acid-Base Excess 3.0 (*)    All other components within normal limits  CULTURE, BLOOD (ROUTINE X 2)  CULTURE, BLOOD (ROUTINE X 2)  RESPIRATORY PANEL BY RT PCR (FLU A&B, COVID)  URINE CULTURE  LACTIC ACID, PLASMA  PROTIME-INR  TSH  LACTIC ACID, PLASMA  HIV ANTIBODY (ROUTINE TESTING W REFLEX)    EKG EKG Interpretation  Date/Time:  Sunday Mar 25 2020 21:22:19 EDT Ventricular Rate:  54 PR Interval:    QRS Duration: 101 QT Interval:  466 QTC Calculation: 442 R Axis:   79 Text Interpretation: Normal sinus rhythm no acute st.st compared with prior 10/19 Confirmed by Meridee Score [phone]) on 03/25/2020 9:25:40 PM Also confirmed by Meridee Score [phone]), editor Alva Garnet [phone])  on 03/26/2020 9:03:35 AM   Radiology CT HEAD WO CONTRAST  Result Date: 03/26/2020 CLINICAL DATA:  54 year old female with continued altered/poor mental status after a fall. Query subdural hematoma. EXAM: CT HEAD WITHOUT CONTRAST TECHNIQUE: Contiguous axial images were obtained from the base of the skull through the vertex without intravenous contrast. COMPARISON:  Head CT 03/24/2020, and earlier.  Brain MRI 06/11/2018. FINDINGS: Brain: Chronic cerebral volume  loss and dystrophic calcifications in the basal ganglia and left cerebellum. Cerebral volume and ventricle size have not significantly changed since 2019. No transependymal edema. No midline shift, ventriculomegaly, mass effect, evidence of mass lesion, intracranial hemorrhage or evidence of cortically based acute infarction. Stable gray-white matter differentiation throughout the brain. No  definite cortical encephalomalacia. Vascular: Calcified atherosclerosis at the skull base. Skull: Stable and intact. Sinuses/Orbits: Visualized paranasal sinuses are stable and well pneumatized. Chronic mastoid opacity/sclerosis is stable. Left tympanic cavity opacification has increased since 2019. Other: No acute orbit or scalp soft tissue finding. IMPRESSION: 1. No acute intracranial abnormality or recent traumatic injury identified. 2. Stable non contrast CT appearance of the brain since 2019 with age advanced cerebral volume loss and probably ex vacuo ventricular enlargement. Electronically Signed   By: Genevie Ann M.D.   On: 03/26/2020 12:11   CT Head Wo Contrast  Result Date: 03/24/2020 CLINICAL DATA:  Status post fall. EXAM: CT HEAD WITHOUT CONTRAST TECHNIQUE: Contiguous axial images were obtained from the base of the skull through the vertex without intravenous contrast. COMPARISON:  August 16, 2018 FINDINGS: Brain: There is moderate severity cerebral atrophy with widening of the extra-axial spaces and ventricular dilatation. There are areas of decreased attenuation within the white matter tracts of the supratentorial brain, consistent with microvascular disease changes. There is bilateral symmetric basal ganglia calcification. Vascular: No hyperdense vessels are identified. Skull: Normal. Negative for fracture or focal lesion. Sinuses/Orbits: No acute finding. Other: There is mild right periorbital and mild right frontal scalp soft tissue swelling. IMPRESSION: 1. Mild right periorbital and mild right frontal scalp soft tissue swelling. 2. No acute intracranial abnormality. Electronically Signed   By: Virgina Norfolk M.D.   On: 03/24/2020 18:37   CT Cervical Spine Wo Contrast  Result Date: 03/24/2020 CLINICAL DATA:  Status post fall with altered mental status. EXAM: CT CERVICAL SPINE WITHOUT CONTRAST TECHNIQUE: Multidetector CT imaging of the cervical spine was performed without intravenous contrast.  Multiplanar CT image reconstructions were also generated. COMPARISON:  June 11, 2018 FINDINGS: Please note that the study is severely limited due to motion artifact, despite of repeated attempts. Alignment: Minimal retrolistheses of C2 on C3 and minimal anterolisthesis of C4 on C5. Reversal of cervical lordosis. Skull base and vertebrae: No evidence of fracture, accounting for the limitations of this study. Soft tissues and spinal canal: No prevertebral fluid or swelling. No visible canal hematoma. Disc levels: Multilevel osteoarthritic changes and posterior facet arthropathy. Upper chest: Negative. Other: None. IMPRESSION: 1. Please note that this study is severely limited due to motion artifact, despite of repeated scanning attempts. No evidence of fracture, accounting for these limitations. 2. Minimal chronic retrolistheses of C2 on C3 and minimal chronic anterolisthesis of C4 on C5, degenerative in etiology. 3. Multilevel osteoarthritic changes and posterior facet arthropathy. Electronically Signed   By: Fidela Salisbury M.D.   On: 03/24/2020 18:20   DG Chest Port 1 View  Result Date: 03/25/2020 CLINICAL DATA:  54 year old female with hypothermia. EXAM: PORTABLE CHEST 1 VIEW COMPARISON:  Chest radiograph dated 03/07/2020. FINDINGS: Minimal bilateral mid to lower lung field interstitial densities may represent atelectatic changes. Atypical infection is less likely. Clinical correlation is recommended. No focal consolidation, pleural effusion or pneumothorax. Stable cardiac silhouette. No acute osseous pathology. Sclerotic lesion in the proximal left humeral diaphysis suboptimally characterized but may represent a chondroid lesion such as an enchondroma. This can be better characterized by MRI on a nonemergent/outpatient basis. IMPRESSION: 1. No focal consolidation. 2.  Minimal bilateral mid to lower lung field interstitial densities may represent atelectatic changes. Electronically Signed   By: Elgie CollardArash   Radparvar M.D.   On: 03/25/2020 21:32    Procedures Procedures (including critical care time)  Medications Ordered in ED Medications  sodium chloride flush (NS) 0.9 % injection 10-40 mL (10 mLs Intracatheter Not Given 03/26/20 0927)  sodium chloride flush (NS) 0.9 % injection 10-40 mL (has no administration in time range)  olopatadine (PATANOL) 0.1 % ophthalmic solution 1 drop (1 drop Both Eyes Given 03/26/20 1023)  triamcinolone ointment (KENALOG) 0.1 % 1 application (1 application Topical Given 03/26/20 1023)  0.9 %  sodium chloride infusion ( Intravenous New Bag/Given 03/26/20 1008)  acetaminophen (TYLENOL) tablet 650 mg (has no administration in time range)    Or  acetaminophen (TYLENOL) suppository 650 mg (has no administration in time range)  polyethylene glycol (MIRALAX / GLYCOLAX) packet 17 g (has no administration in time range)  vancomycin (VANCOREADY) IVPB 750 mg/150 mL (0 mg Intravenous Stopped 03/26/20 1058)  ceFEPIme (MAXIPIME) 2 g in sodium chloride 0.9 % 100 mL IVPB (0 g Intravenous Stopped 03/26/20 1058)  levETIRAcetam (KEPPRA) IVPB 500 mg/100 mL premix (has no administration in time range)  lactated ringers bolus 1,000 mL (0 mLs Intravenous Stopped 03/25/20 2347)  ceFEPIme (MAXIPIME) 2 g in sodium chloride 0.9 % 100 mL IVPB (0 g Intravenous Stopped 03/26/20 0223)  metroNIDAZOLE (FLAGYL) IVPB 500 mg (0 mg Intravenous Stopped 03/26/20 0246)  vancomycin (VANCOCIN) IVPB 1000 mg/200 mL premix (0 mg Intravenous Stopped 03/26/20 0351)  lactated ringers bolus 1,000 mL (0 mLs Intravenous Stopped 03/26/20 0133)    ED Course  I have reviewed the triage vital signs and the nursing notes.  Pertinent labs & imaging results that were available during my care of the patient were reviewed by me and considered in my medical decision making (see chart for details).  Clinical Course as of Mar 27 1343  Sun Mar 25, 2020  [id number] 54 year old female with history of Down's and lives in a group home.  She  had a fall yesterday was [facility] by head and cervical spine CT.  Since then staff is noticed that she has been less interactive.  Here she is got some bruising to her face.  Temperature is low and blood pressure soft.  Getting infectious work-up.  IV fluids.   [MB]  2335 From talking with staff she was taken off one of her psychiatric medications that made her more agitated and put on one that would make her more sedate.  Since then she has been comatose per staff.  Work-up showing a slightly low temperature and elevated white count but normal lactate.  Blood pressure is soft.  Looks like her baseline is more like 110.  Polypharmacy is the most likely cause of her mental status and vitals although still working to exclude infection.  Will likely need admission to the hospital to further clarify this.   [MB]    Clinical Course User Index [MB] Terrilee FilesButler, Michael C, MD   MDM Rules/Calculators/A&P                      Patient is a 54 year old female with history of Down syndrome and Alzheimer's disease, who presents for continued fatigue and lethargy over the past 2 days.  She was evaluated yesterday after a fall with CT of head and neck, found to be negative.  Upon arrival in the ED today, her  vital signs are notable for bradycardia and hypotension.  Rectal temperature is low at 95.7 degrees.  On exam, she is awake and opens her eyes spontaneously.  She does not appear to be in any acute pain or distress.  Abdomen is soft.  Lung sounds are clear to auscultation.  She moves her extremities spontaneously, but all movements are very slow.  She does regard hospital staff.  She has no evidence of decubitus ulcers.  Patient sister accompanies her at the side and says that her current behavior is not her baseline.  Given her change in mental status, hypotension, and hypothermia, infectious work-up was initiated.  A liter bolus of IVF was ordered.  Only blankets were applied.  Following  bolus of IVF, patient remained hypotensive.  After warming blankets were applied, she became normothermic.  Labs were notable for mild hyperkalemia (5.4).  WBC was mildly elevated at 12.0.  This could be secondary to recent traumatic fall, however, infection cannot be excluded.  Hemoglobin, TSH, and lactic acid were normal.  Chest x-ray shows no evidence of pneumonia.  Urinalysis is pending.  Given possibility of occult infection causing her decreased energy, hypotension, and initial hypothermia, broad-spectrum antibiotics were started.  Second bolus of IVF was ordered.  Patient was admitted to medicine for further management and diagnostics.  Final Clinical Impression(s) / ED Diagnoses Final diagnoses:  Generalized weakness    Rx / DC Orders ED Discharge Orders    None       Gloris Manchester, MD 03/26/20 1345    Terrilee Files, MD 03/27/20 1113

## 2020-03-26 ENCOUNTER — Inpatient Hospital Stay (HOSPITAL_COMMUNITY): Payer: Medicare Other

## 2020-03-26 ENCOUNTER — Other Ambulatory Visit: Payer: Self-pay

## 2020-03-26 DIAGNOSIS — G300 Alzheimer's disease with early onset: Secondary | ICD-10-CM | POA: Diagnosis present

## 2020-03-26 DIAGNOSIS — Z79899 Other long term (current) drug therapy: Secondary | ICD-10-CM | POA: Diagnosis not present

## 2020-03-26 DIAGNOSIS — Z79890 Hormone replacement therapy (postmenopausal): Secondary | ICD-10-CM | POA: Diagnosis not present

## 2020-03-26 DIAGNOSIS — M2550 Pain in unspecified joint: Secondary | ICD-10-CM | POA: Diagnosis not present

## 2020-03-26 DIAGNOSIS — R1312 Dysphagia, oropharyngeal phase: Secondary | ICD-10-CM | POA: Diagnosis not present

## 2020-03-26 DIAGNOSIS — G4733 Obstructive sleep apnea (adult) (pediatric): Secondary | ICD-10-CM | POA: Diagnosis present

## 2020-03-26 DIAGNOSIS — R05 Cough: Secondary | ICD-10-CM | POA: Diagnosis not present

## 2020-03-26 DIAGNOSIS — R400 Somnolence: Secondary | ICD-10-CM | POA: Diagnosis not present

## 2020-03-26 DIAGNOSIS — Q210 Ventricular septal defect: Secondary | ICD-10-CM | POA: Diagnosis not present

## 2020-03-26 DIAGNOSIS — S13131S Dislocation of C2/C3 cervical vertebrae, sequela: Secondary | ICD-10-CM | POA: Diagnosis not present

## 2020-03-26 DIAGNOSIS — R0902 Hypoxemia: Secondary | ICD-10-CM | POA: Diagnosis present

## 2020-03-26 DIAGNOSIS — Z993 Dependence on wheelchair: Secondary | ICD-10-CM | POA: Diagnosis not present

## 2020-03-26 DIAGNOSIS — G4700 Insomnia, unspecified: Secondary | ICD-10-CM | POA: Diagnosis not present

## 2020-03-26 DIAGNOSIS — R350 Frequency of micturition: Secondary | ICD-10-CM | POA: Diagnosis not present

## 2020-03-26 DIAGNOSIS — G9349 Other encephalopathy: Secondary | ICD-10-CM | POA: Diagnosis present

## 2020-03-26 DIAGNOSIS — F0280 Dementia in other diseases classified elsewhere without behavioral disturbance: Secondary | ICD-10-CM | POA: Diagnosis not present

## 2020-03-26 DIAGNOSIS — Q909 Down syndrome, unspecified: Secondary | ICD-10-CM | POA: Diagnosis not present

## 2020-03-26 DIAGNOSIS — G4089 Other seizures: Secondary | ICD-10-CM | POA: Diagnosis not present

## 2020-03-26 DIAGNOSIS — R2681 Unsteadiness on feet: Secondary | ICD-10-CM | POA: Diagnosis not present

## 2020-03-26 DIAGNOSIS — F79 Unspecified intellectual disabilities: Secondary | ICD-10-CM | POA: Diagnosis present

## 2020-03-26 DIAGNOSIS — R4182 Altered mental status, unspecified: Secondary | ICD-10-CM | POA: Diagnosis not present

## 2020-03-26 DIAGNOSIS — G309 Alzheimer's disease, unspecified: Secondary | ICD-10-CM | POA: Diagnosis not present

## 2020-03-26 DIAGNOSIS — Z9181 History of falling: Secondary | ICD-10-CM | POA: Diagnosis not present

## 2020-03-26 DIAGNOSIS — R404 Transient alteration of awareness: Secondary | ICD-10-CM | POA: Diagnosis not present

## 2020-03-26 DIAGNOSIS — K219 Gastro-esophageal reflux disease without esophagitis: Secondary | ICD-10-CM | POA: Diagnosis not present

## 2020-03-26 DIAGNOSIS — K5900 Constipation, unspecified: Secondary | ICD-10-CM | POA: Diagnosis not present

## 2020-03-26 DIAGNOSIS — R451 Restlessness and agitation: Secondary | ICD-10-CM | POA: Diagnosis not present

## 2020-03-26 DIAGNOSIS — E785 Hyperlipidemia, unspecified: Secondary | ICD-10-CM | POA: Diagnosis present

## 2020-03-26 DIAGNOSIS — Z6835 Body mass index (BMI) 35.0-35.9, adult: Secondary | ICD-10-CM | POA: Diagnosis not present

## 2020-03-26 DIAGNOSIS — F0281 Dementia in other diseases classified elsewhere with behavioral disturbance: Secondary | ICD-10-CM | POA: Diagnosis present

## 2020-03-26 DIAGNOSIS — F329 Major depressive disorder, single episode, unspecified: Secondary | ICD-10-CM | POA: Diagnosis present

## 2020-03-26 DIAGNOSIS — M6281 Muscle weakness (generalized): Secondary | ICD-10-CM | POA: Diagnosis not present

## 2020-03-26 DIAGNOSIS — R627 Adult failure to thrive: Secondary | ICD-10-CM | POA: Diagnosis not present

## 2020-03-26 DIAGNOSIS — I959 Hypotension, unspecified: Secondary | ICD-10-CM | POA: Diagnosis present

## 2020-03-26 DIAGNOSIS — J309 Allergic rhinitis, unspecified: Secondary | ICD-10-CM | POA: Diagnosis not present

## 2020-03-26 DIAGNOSIS — S0990XA Unspecified injury of head, initial encounter: Secondary | ICD-10-CM | POA: Diagnosis not present

## 2020-03-26 DIAGNOSIS — Z7982 Long term (current) use of aspirin: Secondary | ICD-10-CM | POA: Diagnosis not present

## 2020-03-26 DIAGNOSIS — Y92003 Bedroom of unspecified non-institutional (private) residence as the place of occurrence of the external cause: Secondary | ICD-10-CM | POA: Diagnosis not present

## 2020-03-26 DIAGNOSIS — Z20822 Contact with and (suspected) exposure to covid-19: Secondary | ICD-10-CM | POA: Diagnosis present

## 2020-03-26 DIAGNOSIS — R488 Other symbolic dysfunctions: Secondary | ICD-10-CM | POA: Diagnosis not present

## 2020-03-26 DIAGNOSIS — E559 Vitamin D deficiency, unspecified: Secondary | ICD-10-CM | POA: Diagnosis not present

## 2020-03-26 DIAGNOSIS — R5383 Other fatigue: Secondary | ICD-10-CM | POA: Diagnosis not present

## 2020-03-26 DIAGNOSIS — R52 Pain, unspecified: Secondary | ICD-10-CM | POA: Diagnosis not present

## 2020-03-26 DIAGNOSIS — S0003XA Contusion of scalp, initial encounter: Secondary | ICD-10-CM | POA: Diagnosis present

## 2020-03-26 DIAGNOSIS — R41841 Cognitive communication deficit: Secondary | ICD-10-CM | POA: Diagnosis not present

## 2020-03-26 DIAGNOSIS — W06XXXA Fall from bed, initial encounter: Secondary | ICD-10-CM | POA: Diagnosis present

## 2020-03-26 DIAGNOSIS — G40909 Epilepsy, unspecified, not intractable, without status epilepticus: Secondary | ICD-10-CM | POA: Diagnosis present

## 2020-03-26 DIAGNOSIS — R531 Weakness: Secondary | ICD-10-CM | POA: Diagnosis not present

## 2020-03-26 DIAGNOSIS — F29 Unspecified psychosis not due to a substance or known physiological condition: Secondary | ICD-10-CM | POA: Diagnosis not present

## 2020-03-26 DIAGNOSIS — E039 Hypothyroidism, unspecified: Secondary | ICD-10-CM | POA: Diagnosis present

## 2020-03-26 DIAGNOSIS — Z7401 Bed confinement status: Secondary | ICD-10-CM | POA: Diagnosis not present

## 2020-03-26 DIAGNOSIS — F419 Anxiety disorder, unspecified: Secondary | ICD-10-CM | POA: Diagnosis not present

## 2020-03-26 DIAGNOSIS — S0010XA Contusion of unspecified eyelid and periocular area, initial encounter: Secondary | ICD-10-CM | POA: Diagnosis present

## 2020-03-26 LAB — COMPREHENSIVE METABOLIC PANEL
ALT: 13 U/L (ref 0–44)
AST: 16 U/L (ref 15–41)
Albumin: 2.4 g/dL — ABNORMAL LOW (ref 3.5–5.0)
Alkaline Phosphatase: 56 U/L (ref 38–126)
Anion gap: 5 (ref 5–15)
BUN: 9 mg/dL (ref 6–20)
CO2: 25 mmol/L (ref 22–32)
Calcium: 7.9 mg/dL — ABNORMAL LOW (ref 8.9–10.3)
Chloride: 105 mmol/L (ref 98–111)
Creatinine, Ser: 0.79 mg/dL (ref 0.44–1.00)
GFR calc Af Amer: >60 mL/min (ref >60)
GFR calc non Af Amer: >60 mL/min (ref >60)
Glucose, Bld: 114 mg/dL — ABNORMAL HIGH (ref 70–99)
Potassium: 3.9 mmol/L (ref 3.5–5.1)
Sodium: 135 mmol/L (ref 135–145)
Total Bilirubin: 0.5 mg/dL (ref 0.3–1.2)
Total Protein: 5.1 g/dL — ABNORMAL LOW (ref 6.5–8.1)

## 2020-03-26 LAB — CBC
HCT: 36.3 % (ref 36.0–46.0)
Hemoglobin: 12.1 g/dL (ref 12.0–15.0)
MCH: 32.7 pg (ref 26.0–34.0)
MCHC: 33.3 g/dL (ref 30.0–36.0)
MCV: 98.1 fL (ref 80.0–100.0)
Platelets: 161 10*3/uL (ref 150–400)
RBC: 3.7 MIL/uL — ABNORMAL LOW (ref 3.87–5.11)
RDW: 14.3 % (ref 11.5–15.5)
WBC: 9.6 10*3/uL (ref 4.0–10.5)
nRBC: 0 % (ref 0.0–0.2)

## 2020-03-26 LAB — URINALYSIS, ROUTINE W REFLEX MICROSCOPIC
Bilirubin Urine: NEGATIVE
Glucose, UA: NEGATIVE mg/dL
Hgb urine dipstick: NEGATIVE
Ketones, ur: NEGATIVE mg/dL
Leukocytes,Ua: NEGATIVE
Nitrite: NEGATIVE
Protein, ur: NEGATIVE mg/dL
Specific Gravity, Urine: 1.006 (ref 1.005–1.030)
pH: 7 (ref 5.0–8.0)

## 2020-03-26 LAB — POCT I-STAT EG7
Acid-Base Excess: 3 mmol/L — ABNORMAL HIGH (ref 0.0–2.0)
Bicarbonate: 29.7 mmol/L — ABNORMAL HIGH (ref 20.0–28.0)
Calcium, Ion: 1.17 mmol/L (ref 1.15–1.40)
HCT: 37 % (ref 36.0–46.0)
Hemoglobin: 12.6 g/dL (ref 12.0–15.0)
O2 Saturation: 99 %
Potassium: 3.9 mmol/L (ref 3.5–5.1)
Sodium: 136 mmol/L (ref 135–145)
TCO2: 31 mmol/L (ref 22–32)
pCO2, Ven: 51.9 mmHg (ref 44.0–60.0)
pH, Ven: 7.367 (ref 7.250–7.430)
pO2, Ven: 161 mmHg — ABNORMAL HIGH (ref 32.0–45.0)

## 2020-03-26 LAB — URINE CULTURE: Culture: NO GROWTH

## 2020-03-26 LAB — RAPID URINE DRUG SCREEN, HOSP PERFORMED
Amphetamines: NOT DETECTED
Barbiturates: NOT DETECTED
Benzodiazepines: POSITIVE — AB
Cocaine: NOT DETECTED
Opiates: NOT DETECTED
Tetrahydrocannabinol: NOT DETECTED

## 2020-03-26 LAB — RESPIRATORY PANEL BY RT PCR (FLU A&B, COVID)
Influenza A by PCR: NEGATIVE
Influenza B by PCR: NEGATIVE
SARS Coronavirus 2 by RT PCR: NEGATIVE

## 2020-03-26 MED ORDER — MELATONIN 5 MG PO TABS: 5.0000 mg | ORAL_TABLET | Freq: Every day | ORAL | Status: DC

## 2020-03-26 MED ORDER — LEVETIRACETAM IN NACL 500 MG/100ML IV SOLN
500.0000 mg | Freq: Two times a day (BID) | INTRAVENOUS | Status: DC
  Administered 2020-03-27: 500 mg via INTRAVENOUS
  Filled 2020-03-26 (×3): qty 100

## 2020-03-26 MED ORDER — ASPIRIN 81 MG PO CHEW
81.0000 mg | CHEWABLE_TABLET | Freq: Every day | ORAL | Status: DC
  Administered 2020-03-26 – 2020-04-04 (×10): 81 mg via ORAL
  Filled 2020-03-26 (×10): qty 1

## 2020-03-26 MED ORDER — ENOXAPARIN SODIUM 40 MG/0.4ML ~~LOC~~ SOLN
40.0000 mg | SUBCUTANEOUS | Status: DC
  Administered 2020-03-27 – 2020-04-04 (×9): 40 mg via SUBCUTANEOUS
  Filled 2020-03-26 (×7): qty 0.4

## 2020-03-26 MED ORDER — POLYETHYLENE GLYCOL 3350 17 G PO PACK: 17.0000 g | PACK | Freq: Every day | ORAL | Status: DC | PRN

## 2020-03-26 MED ORDER — TRIAMCINOLONE ACETONIDE 0.1 % EX OINT
1.0000 "application " | TOPICAL_OINTMENT | Freq: Three times a day (TID) | CUTANEOUS | Status: DC
  Administered 2020-03-26 – 2020-04-04 (×27): 1 via TOPICAL
  Filled 2020-03-26: qty 15

## 2020-03-26 MED ORDER — OLOPATADINE HCL 0.1 % OP SOLN
1.0000 [drp] | Freq: Two times a day (BID) | OPHTHALMIC | Status: DC
  Administered 2020-03-26 – 2020-04-04 (×17): 1 [drp] via OPHTHALMIC
  Filled 2020-03-26: qty 5

## 2020-03-26 MED ORDER — SODIUM CHLORIDE 0.9 % IV SOLN
2.0000 g | Freq: Three times a day (TID) | INTRAVENOUS | Status: DC
  Administered 2020-03-26 – 2020-03-27 (×3): 2 g via INTRAVENOUS
  Filled 2020-03-26 (×3): qty 2

## 2020-03-26 MED ORDER — SODIUM CHLORIDE 0.9% FLUSH
10.0000 mL | Freq: Two times a day (BID) | INTRAVENOUS | Status: DC
  Administered 2020-03-26 – 2020-04-03 (×6): 10 mL

## 2020-03-26 MED ORDER — ACETAMINOPHEN 325 MG PO TABS: 650.0000 mg | ORAL_TABLET | Freq: Two times a day (BID) | ORAL | Status: DC

## 2020-03-26 MED ORDER — SODIUM CHLORIDE 0.9% FLUSH: 10.0000 mL | INTRAVENOUS | Status: DC | PRN

## 2020-03-26 MED ORDER — ACETYLCYSTEINE 600 MG PO CAPS: 600.0000 mg | ORAL_CAPSULE | Freq: Two times a day (BID) | ORAL | Status: DC

## 2020-03-26 MED ORDER — ENOXAPARIN SODIUM 40 MG/0.4ML ~~LOC~~ SOLN
40.0000 mg | SUBCUTANEOUS | Status: DC
  Administered 2020-03-26: 40 mg via SUBCUTANEOUS
  Filled 2020-03-26: qty 0.4

## 2020-03-26 MED ORDER — ADULT MULTIVITAMIN W/MINERALS CH: 1.0000 | ORAL_TABLET | Freq: Every day | ORAL | Status: DC

## 2020-03-26 MED ORDER — LEVETIRACETAM 500 MG PO TABS
500.0000 mg | ORAL_TABLET | Freq: Two times a day (BID) | ORAL | Status: DC
  Administered 2020-03-26: 500 mg via ORAL
  Filled 2020-03-26: qty 1

## 2020-03-26 MED ORDER — SODIUM FLUORIDE 1.1 % DT CREA: 1.0000 "application " | TOPICAL_CREAM | Freq: Every day | DENTAL | Status: DC

## 2020-03-26 MED ORDER — DONEPEZIL HCL 5 MG PO TABS: 5.0000 mg | ORAL_TABLET | Freq: Every day | ORAL | Status: DC

## 2020-03-26 MED ORDER — CALCIUM CARBONATE ANTACID 500 MG PO CHEW: 400.0000 mg | CHEWABLE_TABLET | Freq: Three times a day (TID) | ORAL | Status: DC

## 2020-03-26 MED ORDER — ACETAMINOPHEN 325 MG PO TABS: 650.0000 mg | ORAL_TABLET | Freq: Four times a day (QID) | ORAL | Status: DC | PRN

## 2020-03-26 MED ORDER — SIMVASTATIN 20 MG PO TABS: 20.0000 mg | ORAL_TABLET | Freq: Every evening | ORAL | Status: DC

## 2020-03-26 MED ORDER — ACETAMINOPHEN 650 MG RE SUPP: 650.0000 mg | Freq: Four times a day (QID) | RECTAL | Status: DC | PRN

## 2020-03-26 MED ORDER — MEMANTINE HCL ER 7 MG PO CP24
7.0000 mg | ORAL_CAPSULE | Freq: Every day | ORAL | Status: DC
  Filled 2020-03-26: qty 1

## 2020-03-26 MED ORDER — ASPIRIN EC 81 MG PO TBEC: 81.0000 mg | DELAYED_RELEASE_TABLET | Freq: Every day | ORAL | Status: DC

## 2020-03-26 MED ORDER — SODIUM CHLORIDE 0.9 % IV SOLN: INTRAVENOUS | Status: DC

## 2020-03-26 MED ORDER — LEVOTHYROXINE SODIUM 75 MCG PO TABS: 75.0000 ug | ORAL_TABLET | Freq: Every day | ORAL | Status: DC

## 2020-03-26 MED ORDER — VANCOMYCIN HCL 750 MG/150ML IV SOLN
750.0000 mg | Freq: Two times a day (BID) | INTRAVENOUS | Status: DC
  Administered 2020-03-26 (×2): 750 mg via INTRAVENOUS
  Filled 2020-03-26 (×3): qty 150

## 2020-03-26 NOTE — Procedures (Signed)
Patient Name: Jasmine Ramirez  MRN: [id number]  Epilepsy Attending: Charlsie Quest  Referring Physician/Provider: Dr. Towanda Octave Date: 03/26/2020 Duration: 23.55 mins  Patient history: 33 old female with history of Down syndrome, seizures, early onset Alzheimer's presented with increased drowsiness.  EEG evaluate for seizures.  Level of alertness: Awake  AEDs during EEG study: Keppra  Technical aspects: This EEG study was done with scalp electrodes positioned according to the 10-20 International system of electrode placement. Electrical activity was acquired at a sampling rate of 500Hz  and reviewed with a high frequency filter of 70Hz  and a low frequency filter of 1Hz . EEG data were recorded continuously and digitally stored.   Description: No clear posterior dominant rhythm was seen. EEG showed continuous generalized 3-6Hz  theta-delta slowing.  Hyperventilation and photic stimulation were not performed.  Of note, eeg was technically difficult due to significant movement artifact.   Abnormality -Continuous slow, generalized  IMPRESSION: This technically difficult study is suggestive of moderate diffuse encephalopathy, nonspecific etiology but likely secondary to patient's baseline static encephalopathy. No seizures or epileptiform discharges were seen throughout the recording.  Jasmine Ramirez 

## 2020-03-26 NOTE — Progress Notes (Addendum)
Called to room [phone] to assess somnolent patient and low maps.  Upon arriving to room, patient is somnolent and drowsy.  She awakes to sternal rub and withdraws from pain in all extremities.  Her patellar and BR reflexes are 2+ bilaterally.  Her pupils are reactive to light from 1mm to 4mm. Neuro exam is non-focal. GCS: eye opening to speech. Verbal response- patient non-verbal at baseline except for yes/no. Motor: withrdraws from pain.  Patient responded well to 1 L bolus. MAPs increased from mid 30's to 50.   Differential for patients AMS is likely due to benzodiazepine ingestion (just recently started). Lives at group home, and unfortunately, there is no history provided at this time. No metabolic derangements, Calcium corrects normally.  2019 echo with small VSD and patient in wheelchair at baseline. Neuro exam non-focal & normal CT. Patient does have hx of seizures, but no noted seizure activity since being in the ED, making post-ictal state less likely. Patient's somnolence, pinpoint pupils could indicate complications from ingestion.   CCM consulted    Keep MAPS > 50  Responding well to IV fluid bolus   Called poison control   Supportive care   Observe until she is back at her baseline   They will page intern pager later on to check back in   Continue to monitor closely   IV keppra, next dose 1800   Melene Plan, M.D.  9:30 AM 03/26/2020

## 2020-03-26 NOTE — Discharge Summary (Addendum)
Family Medicine Teaching [facility] Discharge Summary  Patient name: Jasmine Ramirez Medical record number: [id number] Date of birth: 1966/09/21 Age: 54 y.o. Gender: female Date of Admission: 03/25/2020  Date of Discharge: 03/29/20 Admitting Physician: Shirlean Mylar, MD  Primary Care Provider: Lucretia Field Consultants: CCM  Indication for Hospitalization: increased drowsiness  Discharge Diagnoses/Problem List:  Increased sedation due to benzodiazepine OSA Alzheimer's disease Seizure disorder  Disposition: to group home  Discharge Condition: stable and improved   Discharge Exam:  Vitals:   03/30/20 0317 03/30/20 0830  BP: 111/75 116/65  Pulse: 62 (!) 55  Resp: 17 18  Temp: 98.6 F (37 C) 97.6 F (36.4 C)  SpO2: 99% 96%  General: alert, older woman with DS, resting comfortably in bed, NAD Cardiovascular: RRR, no m/r/g Respiratory: CTAB, some stertorous upper airway sounds while supine Abdomen: soft, NT, ND Extremities: moving all 4 limbs, limited LUE movement due to chronically dislocated shoulder, warm and dry  Brief Hospital Course:  Increased Drowsiness Patient presented for concern of increased lethargy, drowsiness over the weekend. She also had fallen out of bed on 5/8 and sustained two black eyes, CT head negative for acute process other than periorbital swelling. Patient recently had medication change to klonopin 0.5 mg daily from lorazepam 1mg  daily for agitation/violent behavior related to increasing dementia (benzodiazepines initiated in November 2020). Family noticed that patient was much more drowsy and lethargic than normal: not alert, head slumped in wheel chair. Initially there was concern for infectious process and patient was treated with broad spectrum antibiotics for one day. These were discontinued with negative blood and urine cultures. Repeat head CT without any acute intracranial process. EEG with baseline static encephalopathy most likely due  to dementia, no seizures or epileptiform activity. I suspect that patient may be more sensitive to klonopin than lorazepam, as well as klonopin has a longer half-life than lorazepam, and benzodiazepines are not preferred in geriatric or dementia patients due to increased risk for delirium, sedation, falls, etc. Will start patient on a 1 week taper of 0.25mg  klonopin, and recommend that instead of benzodiazepines, second generation antipsychotics such as quetiapine be used for agitation or sundowning with dementia.  OSA  Hypoxic Events Patient notably has quite loud snoring, and when sleeping supine and flat can have apneic events where she desaturates to the 30s, and her MAP and HR can similarly drop. When patient is turned on her side, these events do not occur. Patient was examined after one such apneic event yesterday by pulmonology, who attribute the cause of the event to OSA. Attempted to use CPAP, but patient cannot tolerate it. She needed a mitten to prevent from removing leads and O2 tubing. This is patient's baseline function. Recommend patient sleep at an inclined angle, and/or propped on her side, to prevent apneic events.  Issues for Follow Up:  1. Recommend tapering off of scheduled benzodiazepines and using less sedating medication for patient's aggressive behavior. We recommend using a second generation antipsychotic such as quetiapine at low dose and increasing as needed for agitation and sundowning related to dementia. 2. Prevent OSA by sleeping with head of bead between 45-90 degrees, and/or laying patient on her side. 3. Patient is full code and has increasing dementia. Recommend discussing goals of care for patient with her primary care physician.  Significant Procedures: none  Significant Labs and Imaging:  Recent Labs  Lab 03/26/20 0404 03/26/20 0404 03/26/20 0625 03/27/20 0309 03/28/20 0733  WBC 9.6  --   --  6.4 5.4  HGB 12.1   < > 12.6 12.2 12.2  HCT 36.3   < > 37.0  36.5 36.8  PLT 161  --   --  167 164   < > = values in this interval not displayed.   Recent Labs  Lab 03/25/20 2129 03/25/20 2129 03/26/20 0404 03/26/20 0404 03/26/20 0625 03/26/20 0625 03/27/20 0309 03/28/20 0733  NA 134*  --  135  --  136  --  138 137  K 5.4*   < > 3.9   < > 3.9   < > 4.3 4.2  CL 103  --  105  --   --   --  106 106  CO2 24  --  25  --   --   --  24 26  GLUCOSE 85  --  114*  --   --   --  83 110*  BUN 13  --  9  --   --   --  8 12  CREATININE 0.91  --  0.79  --   --   --  0.75 0.83  CALCIUM 8.2*  --  7.9*  --   --   --  7.7* 8.2*  ALKPHOS 73  --  56  --   --   --  54  --   AST 29  --  16  --   --   --  18  --   ALT 14  --  13  --   --   --  12  --   ALBUMIN 2.9*  --  2.4*  --   --   --  2.4*  --    < > = values in this interval not displayed.   EXAM: 03/24/20 CT HEAD WITHOUT CONTRAST COMPARISON:  August 16, 2018 IMPRESSION: 1. Mild right periorbital and mild right frontal scalp soft tissue swelling. 2. No acute intracranial abnormality.  EXAM: 03/25/20 PORTABLE CHEST 1 VIEW IMPRESSION: 1. No focal consolidation. 2. Minimal bilateral mid to lower lung field interstitial densities may represent atelectatic changes\  EXAM: 03/26/20 CT HEAD WITHOUT CONTRAST COMPARISON:  Head CT 03/24/2020, and earlier.  Brain MRI 06/11/2018.  IMPRESSION: 1. No acute intracranial abnormality or recent traumatic injury identified. 2. Stable non contrast CT appearance of the brain since 2019 with age advanced cerebral volume loss and probably ex vacuo ventricular Enlargement.  Results/Tests Pending at Time of Discharge: none  Discharge Medications:  Allergies as of 03/29/2020   No Known Allergies     Medication List    STOP taking these medications   clonazePAM 0.5 MG tablet Commonly known as: KLONOPIN Replaced by: clonazePAM 0.25 MG disintegrating tablet     TAKE these medications   acetaminophen 325 MG tablet Commonly known as: TYLENOL Take 650 mg by  mouth 2 (two) times daily.   aspirin EC 81 MG tablet Take 81 mg by mouth daily.   CALCIUM 600 PO Take 600 mg by mouth 2 (two) times daily.   clonazePAM 0.25 MG disintegrating tablet Commonly known as: KLONOPIN Take 1 tablet (0.25 mg total) by mouth daily for 4 days. Replaces: clonazePAM 0.5 MG tablet   Denta 5000 Plus 1.1 % Crea dental cream Generic drug: sodium fluoride Place 1 application onto teeth at bedtime.   donepezil 5 MG tablet Commonly known as: ARICEPT Take 5 mg by mouth at bedtime.   Eye Allergy Itch Relief 0.2 % Soln Generic drug: Olopatadine HCl Place 1 drop into both  eyes 2 (two) times daily.   levETIRAcetam 500 MG tablet Commonly known as: KEPPRA Take 1 tablet (500 mg total) by mouth 2 (two) times daily.   levothyroxine 75 MCG tablet Commonly known as: SYNTHROID Take 75 mcg by mouth daily before breakfast.   melatonin 5 MG Tabs Take 5 mg by mouth at bedtime.   memantine 7 MG Cp24 24 hr capsule Commonly known as: NAMENDA XR Take 7 mg by mouth daily.   NAC 600 MG Caps Generic drug: Acetylcysteine Take 600 mg by mouth 2 (two) times daily.   Omega-3 1000 MG Caps Take 1,000 mg by mouth 2 (two) times daily.   simvastatin 20 MG tablet Commonly known as: ZOCOR Take 20 mg by mouth every evening.   traZODone 50 MG tablet Commonly known as: DESYREL Take 50 mg by mouth at bedtime.   triamcinolone ointment 0.1 % Commonly known as: KENALOG Apply 1 application topically See admin instructions. Apply topically three times daily until blisters are healed.   Vitamin D3 50 MCG (2000 UT) Tabs Take 2,000 Units by mouth daily.       Discharge Instructions: Please refer to Patient Instructions section of EMR for full details.  Patient was counseled important signs and symptoms that should prompt return to medical care, changes in medications, dietary instructions, activity restrictions, and follow up appointments.   Follow-Up Appointments: Follow-up  Information    [facility] [place] Follow up.   Specialty: Family Medicine Why: Please follow up hospital visit next wednesday Contact information: [place] [id number] [phone]          Gladys Damme, MD 03/29/2020, 12:52 PM PGY-1, [place]

## 2020-03-26 NOTE — ED Notes (Signed)
SDU Dinner ordered 

## 2020-03-26 NOTE — H&P (Addendum)
[facility] Admission History and Physical Service Pager: [phone]  Patient name: Jasmine Ramirez Medical record number: [id number] Date of birth: [birth year] Age: 54 y.o. Gender: female  Primary Care Provider: Trinidad Curet Consultants: Neuro Code Status: Full Code Preferred Emergency Contact: Haywood Pao, Sister, [phone]   Chief Complaint: increased drowsiness   Assessment and Plan: [name] is a 54 y.o. female presenting with drowsiness. PMH is significant for Down Syndrome, hypothyroidism, seizures, early onset alzheimer's, heart murmur (VSD).   Increased Drowsiness  Patient is reported to have been increasingly drowsy for 1 day following a fall in which she sustained multiple forehead bruises.  Recent head CT on 5/8 without traumatic injury.  Differential includes underlying infection given the patient's hypothermia, decreased alertness. WBC of 9 is not remarkable for leukocytosis, patient recently treated for blisters on left hand otherwise no dermatologic sources of possible infection. Will monitor blood and urine cultures as available. U/A without abnormalities. PNA less likely as patient's CXR without that are more consistent with atelectatic changes, and patient had negative COVID-19 testing. Also of importance to note that patient sustained a fall yesterday but with normal head imaging, an intracranial bleed is not suspected as imaging showed no intracranial abnormalities outside of periorbital and frontal scalp hematomas. Patient's change in alertness is likely due to polypharmacy given that she has recently started a new benzodiazapine prescription in the past week. Of note, patient is apnic and snoring later on exam. VBG hypercarbic, but normal pH. OSA is possible, but not likely leading to falls.  Patient will be admitted for observation and follow up for cultures.  -admit to med-surg for observation, attending Dr. Ardelia Mems   -cardiac monitoring  -neurochecks q 4 hours  -f/u blood culx  -collect urine cultures  -AM CBC, CMP -Vital signs per floor protocol -Tylenol as needed -NS 125 mL/hr - continue cefepime, flagyl, vanc, deescalate prending clinical improvement vs.   Early onset Alzheimer's  Down Syndrome  Patient with a history of early onset Alzheimer's and Down syndrome.  Patient currently lives in a group home.  Home medications include Namenda and Aricept. Minimally verbal at baseline, answers yes or no questions, can say ouch.  - continue home Aricept -Continue Namenda 7 mg  Hypothyroidism  Home medications include Synthroid 75 mcg -Continue home Synthroid -TSH within normal limits on admission  Hx of Seizures Patient on Keppra 500 mg twice daily.  No recent report of seizure activity. -Continue Keppra 500 mg twice daily  Hyperlipidemia Home medication includes simvastatin 20 mg daily. -Continue simvastatin  FEN/GI: Soft diet,   Prophylaxis: lovenox 40mg   Disposition: admit to med surg   History of Present Illness:  Jasmine Ramirez is a 54 y.o. female presenting with increased drowsiness following unwitnessed fall and 10 days of new prescription for klonopin. Patient's sister, Darden Amber, is present at the bedside and provides the history for this encounter as the patient has limited verbal communication. Patient's sister reports seeing the patient at 3:30 on 5/8 and noticed that she was very drowsy and not as interactive and alert as normal. The patient is reported to have fallen on 03/24/2020.  Fall was unwitnessed but patient was reported to have been in her bed and staff was assisting other residents when they heard a loud noise and went to the patient's room to find her lying on her side and in the floor.  Patient uses manual wheelchair for mobility at baseline.  Patient was evaluated in the ED and had  head imaging that showed no acute intracranial abnormalities so she was discharged with  forehead hematomas and periorbital ecchymosis bilaterally. No report of recent seizures.  Sister does report that the patient had blisters on hands and was treated with antibiotics for those.  Patient sister denies new dietary changes recently and states that the patient does not appear to be in pain, however, yesterday she repeatedly touched her head and said "ouch". At [facility], patient's communication includes "ok" & "im fine" regardless of the question, answers yes to questions requesting food.    PCP: Dr. Dayna Barker  Not aware of any specialists,  sister visits every other week  ED Course:  Patient started on cefepime, Metronidazole and Vanc due to concern for infection due to unknown source  Given 2 liters of LR. Lactic acid within normal limits at 1.3 Patient noted to have hypothermia to 95.7 and improved with warmed blankets.   Review Of Systems: Per HPI with the following additions:   Review of Systems  Constitutional: Positive for activity change. Negative for appetite change, chills and fever.  HENT: Negative for congestion and rhinorrhea.   Respiratory: Negative for cough and wheezing.   Cardiovascular: Positive for leg swelling.  Gastrointestinal: Negative for diarrhea and vomiting.  Genitourinary: Negative for decreased urine volume, vaginal bleeding and vaginal discharge.  Neurological: Negative for tremors, seizures and facial asymmetry.     Patient Active Problem List   Diagnosis Date Noted  . AMS (altered mental status) 03/26/2020  . Seizure (HCC) 08/26/2018  . VSD (ventricular septal defect), perimembranous   . Hyperextension injury of cervical spine, initial encounter 06/11/2018  . Alzheimer's disease with early onset (HCC) 06/11/2018  . Spinal cord injury to cervical region without bone injury (HCC) 06/11/2018  . Well adult exam 10/09/2015  . Abnormal TSH 10/09/2015  . Rash and nonspecific skin eruption 10/09/2015  . Depression 02/27/2014  . Down's syndrome 12/14/2013     Past Medical History: Past Medical History:  Diagnosis Date  . Allergy 06/11/2018  . Chicken pox 06/11/2018  . Down syndrome 06/11/2018  . Heart murmur 06/11/2018  . Intellectual disability   . Myopia of both eyes     Past Surgical History: Past Surgical History:  Procedure Laterality Date  . KNEE SURGERY  2013  . RADIOLOGY WITH ANESTHESIA N/A 06/12/2018   Procedure: MRI WITH ANESTHESIA;  Surgeon: Radiologist, Medication, MD;  Location: [facility] OR;  Service: Radiology;  Laterality: N/A;    Social History: Social History   Tobacco Use  . Smoking status: Never Smoker  . Smokeless tobacco: Never Used  Substance Use Topics  . Alcohol use: Never  . Drug use: Never   Family History: Family History  Problem Relation Age of Onset  . Cancer Father        Prostate  . Diabetes Father   . Cancer Maternal Grandmother        Breast  . Cancer Paternal Grandfather        Colon    Allergies and Medications: No Known Allergies No current facility-administered medications on file prior to encounter.   Current Outpatient Medications on File Prior to Encounter  Medication Sig Dispense Refill  . acetaminophen (TYLENOL) 325 MG tablet Take 650 mg by mouth 2 (two) times daily.     . Acetylcysteine (NAC) 600 MG CAPS Take 600 mg by mouth 2 (two) times daily.    Marland Kitchen aspirin EC 81 MG tablet Take 81 mg by mouth daily.    . Calcium Carbonate (CALCIUM  600 PO) Take 600 mg by mouth 2 (two) times daily.    . Cholecalciferol (VITAMIN D3) 2000 units TABS Take 2,000 Units by mouth daily.    . clonazePAM (KLONOPIN) 0.5 MG tablet Take 0.5 mg by mouth daily.    Marland Kitchen donepezil (ARICEPT) 5 MG tablet Take 5 mg by mouth at bedtime.    . levETIRAcetam (KEPPRA) 500 MG tablet Take 1 tablet (500 mg total) by mouth 2 (two) times daily. 60 tablet 0  . levothyroxine (SYNTHROID) 75 MCG tablet Take 75 mcg by mouth daily before breakfast.    . melatonin 5 MG TABS Take 5 mg by mouth at bedtime.    . memantine (NAMENDA  XR) 7 MG CP24 24 hr capsule Take 7 mg by mouth daily.    . Olopatadine HCl (EYE ALLERGY ITCH RELIEF) 0.2 % SOLN Place 1 drop into both eyes 2 (two) times daily.    . Omega-3 1000 MG CAPS Take 1,000 mg by mouth 2 (two) times daily.    . simvastatin (ZOCOR) 20 MG tablet Take 20 mg by mouth every evening.    . sodium fluoride (DENTA 5000 PLUS) 1.1 % CREA dental cream Place 1 application onto teeth at bedtime.    . traZODone (DESYREL) 50 MG tablet Take 50 mg by mouth at bedtime.    . triamcinolone ointment (KENALOG) 0.1 % Apply 1 application topically See admin instructions. Apply topically three times daily until blisters are healed.    . [DISCONTINUED] pantoprazole (PROTONIX) 40 MG tablet Take 1 tablet (40 mg total) by mouth daily. (Patient not taking: Reported on 08/16/2018) 30 tablet 0    Objective: BP 94/64   Pulse (!) 45   Temp 97.9 F (36.6 C) (Oral)   Resp 18   SpO2 99%   Exam: General: female appearing stated age in no acute distress HEENT: MMM, no oral lesions noted, neck non-tender without lymphadenopathy, no subconjunctival hemorrhage or pupil injury bilaterally Cardio: Rhythm is regular. Bilateral radial pulses palpable, unable to appreciate murmur on today's exam Pulm: Clear to auscultation bilaterally, no crackles, wheezing, or diminished breath sounds. Normal respiratory effort, stable on RA Abdomen: Bowel sounds normal. Abdomen soft and non-tender. Extremities: bilateral non pitting edema on lower extremities, sister reports that this is chronic  Neuro: pt alert, does not say more than "ouch" during my examination of her lower extremities   Labs and Imaging: CBC BMET  Recent Labs  Lab 03/26/20 0404 03/26/20 0404 03/26/20 0625  WBC 9.6  --   --   HGB 12.1   < > 12.6  HCT 36.3   < > 37.0  PLT 161  --   --    < > = values in this interval not displayed.   Recent Labs  Lab 03/26/20 0404 03/26/20 0404 03/26/20 0625  NA 135   < > 136  K 3.9   < > 3.9  CL 105  --    --   CO2 25  --   --   BUN 9  --   --   CREATININE 0.79  --   --   GLUCOSE 114*  --   --   CALCIUM 7.9*  --   --    < > = values in this interval not displayed.     EKG: NSR, QTC 442   DG Chest Port 1 View  Result Date: 03/25/2020 CLINICAL DATA:  54 year old female with hypothermia. EXAM: PORTABLE CHEST 1 VIEW COMPARISON:  Chest radiograph dated 03/07/2020.  FINDINGS: Minimal bilateral mid to lower lung field interstitial densities may represent atelectatic changes. Atypical infection is less likely. Clinical correlation is recommended. No focal consolidation, pleural effusion or pneumothorax. Stable cardiac silhouette. No acute osseous pathology. Sclerotic lesion in the proximal left humeral diaphysis suboptimally characterized but may represent a chondroid lesion such as an enchondroma. This can be better characterized by MRI on a nonemergent/outpatient basis. IMPRESSION: 1. No focal consolidation. 2. Minimal bilateral mid to lower lung field interstitial densities may represent atelectatic changes. Electronically Signed   By: Elgie Collard M.D.   On: 03/25/2020 21:32    RESIDENT ATTESTATION   I have seen and examined this patient.    I have discussed the findings and exam with the intern and agree with the above note, which I have edited appropriately in BLUE. I helped develop the management plan that is described in the resident's note, and I agree with the content.    Garnette Gunner, MD 03/26/2020, 7:32 AM PGY-3, [facility] FPTS Intern pager: [phone], text pages welcome

## 2020-03-26 NOTE — Progress Notes (Signed)
Pharmacy Antibiotic Note  Jasmine Ramirez is a 54 y.o. female admitted on 03/25/2020 with AMS, possible sepsis.  Pharmacy has been consulted for Vancomycin and Cefepime  Dosing.  Vancomycin 1 g IV given in ED at  0245  Plan: Vancomycin  750 mg IV q12h Cefepime 2 g IV q8h     Temp (24hrs), Avg:97.2 F (36.2 C), Min:95.7 F (35.4 C), Max:97.9 F (36.6 C)  Recent Labs  Lab 03/25/20 2129 03/26/20 0404  WBC 12.0* 9.6  CREATININE 0.91 0.79  LATICACIDVEN 1.3  --     Estimated Creatinine Clearance: 66 mL/min (by C-G formula based on SCr of 0.79 mg/dL).    No Known Allergies   Eddie Candle 03/26/2020 7:18 AM

## 2020-03-26 NOTE — ED Notes (Signed)
This RN performed an in-and-out catheterization along with Lauren, NT and the procedure resulted in no urine output. EDP made aware.

## 2020-03-26 NOTE — ED Notes (Signed)
ED Provider at bedside. 

## 2020-03-26 NOTE — ED Notes (Signed)
While assessing pt there was a half chewed pill that was in the pt bed possibly her kepra dose. We are not giving oral medications at this time until pts improves and able to stay awake better. Kepra reordered IV for 1800.

## 2020-03-26 NOTE — Progress Notes (Signed)
EEG completed, results pending. 

## 2020-03-26 NOTE — ED Notes (Signed)
RN paged Janee Morn. MD in regard to patient bradycardia rate (38-45) as well as hypotension (98/51). Verbal orders to increase rate of NS infusion to 156ml/hr. Pt is also on 2L [place] d/t o2 sat dipping around 87-89% while patient is asleep. Pt is currently sleeping at this time and does not appear to be showing any signs of distress or symptomatic changes based off vital signs. MD Janee Morn is at bedside at this time.

## 2020-03-26 NOTE — Hospital Course (Addendum)
Increased Drowsiness Patient presented for concern of increased lethargy, drowsiness over the weekend. She also had fallen out of bed on 5/8 and sustained two black eyes, CT head negative for acute process other than periorbital swelling. Patient recently had medication change to klonopin 0.5 mg daily from loarzepam 1mg  daily for agitation/violent behavior related to increasing dementia (benzodiazepines initiated in November, 2020). Family noticed that patient was much more drowsy and lethargic than normal: not alert, head slumped in wheel chair. Initially there was concern for infectious process and patient was treated with broad spectrum antibiotics for one day. These were discontinued with negative blood and urine cultures. Repeat head CT without any acute intracranial process. EEG with baseline static encephalopathy most likely due to dementia, no seizures or epileptiform activity. I suspect that patient may be more sensitive to klonopin than lorazepam, and benzodiazepines are not preferred in geriatric or dementia patients due to increased risk for delirium, sedation, falls, etc. Will start patient on a 1 week taper of 0.25mg  klonopin, and recommend that instead of benzodiazepines, second generation antipsychotics such as quetiapine be used for agitation or sundowning with dementia.  OSA  Hypoxic Events Patient has returned to her neurologic baseline of answering yes and no, and saying "ouch." She is alert, and not oriented (baseline). VSS while awake. Patient notably has quite loud snoring, and when sleeping supine and flat can have apneic events where she desaturates to the 30s, and her MAP and HR can similarly drop. When patient is turned on her side, these events do not occur. Patient was examined after one such apneic event yesterday by CCM, who attribute the cause of the event to OSA. Attempted to use CPAP last night, but patient cannot tolerate it. She needed a mitten to prevent from removing leads  and O2 tubing. Similarly, patient had a similar apneic event 5/11 for 30 seconds that resolved when the patient awoke and turned. Recommend patient sleep with O2 via [place] and at an inclined angle, and/or propped on her side, to prevent apneic events.  Early onset Alzheimer's  Down Syndrome  Patient with a history of early onset Alzheimer's and Down syndrome. Recommend family be referred to palliative care and begin goals of care discussions as dementia progresses.   Follow up:  Decreased klonopin from 0.25 to .125 as she was doing well with this medication as far as AMS and aggression  Prevent OSA by sleeping with head of bead >45*, O2 via nasal cannula

## 2020-03-26 NOTE — Consult Note (Signed)
NAME:  Jasmine Ramirez, MRN:  [id number], DOB:  [birth year], LOS: 0 ADMISSION DATE:  03/25/2020, CONSULTATION DATE:  03/26/20 REFERRING MD:  Selena Batten, CHIEF COMPLAINT:  AMS   Brief History   54 year old woman with Down syndrome and advancing dementia presenting with AMS in setting of recently starting klonipin.  History of present illness   54 year old woman with Down syndrome and advancing dementia presenting with AMS in setting of recently starting klonipin.  History per chart review and sister at bedside as patient is currently nonverbal.  Over past several months worsening behavioral disturbances and memory.  Sounds like recently started on klonipin.  Came in with somnolence after unwitnessed fall and scalp contusions.  While in ER developed lower sats, hypotension and worsening somnolence.  PCCM consulted.  Past Medical History  Down Syndrome What sounds like worsening dementia and behavioral issues  Significant Hospital Events   5/10 admitted  Consults:    Procedures:    Significant Diagnostic Tests:  CT Head 5/8 IMPRESSION: 1. Mild right periorbital and mild right frontal scalp soft tissue swelling. 2. No acute intracranial abnormality.  Micro Data:  None  Antimicrobials:  None   Interim history/subjective:  Consulted  Objective   Blood pressure (!) 83/46, pulse (!) 50, temperature 97.6 F (36.4 C), temperature source Axillary, resp. rate 12, SpO2 100 %.        Intake/Output Summary (Last 24 hours) at 03/26/2020 0954 Last data filed at 03/26/2020 0351 Gross per 24 hour  Intake 200 ml  Output -  Net 200 ml   There were no vitals filed for this visit.  Examination: General: somnolent woman snoring in bed HENT: periorbital ecchymoses worse on left Lungs: clear, some transmitted upper airway sounds Cardiovascular: RRR, ext warm Abdomen: Soft, +BS Extremities: No edema Neuro: she will move all 4 ext intermittently to command Skin: no rashes  Resolved  Hospital Problem list   N/A  Assessment & Plan:  AMS in setting of new klonipin prescription, home trazadone use- this is improving per sister report.  Nonfocal neuro exam.  Protecting airway, opens eyes to voice. - Okay for stepdown - Avoid all sedating meds and give her a day or two for this to go away  Advancing Alzheimer's- told sister to start goals of care discussions with her mother.  Hypoxemia- quickly went from RA to 10L now back to 2L, what is most likely here is she has OSA.  Could try CPAP but doubt she would be compliance.  Please call if any further questions or concerns  Labs   CBC: Recent Labs  Lab 03/25/20 2129 03/26/20 0404 03/26/20 0625  WBC 12.0* 9.6  --   NEUTROABS 10.3*  --   --   HGB 13.7 12.1 12.6  HCT 40.8 36.3 37.0  MCV 97.1 98.1  --   PLT 192 161  --     Basic Metabolic Panel: Recent Labs  Lab 03/25/20 2129 03/26/20 0404 03/26/20 0625  NA 134* 135 136  K 5.4* 3.9 3.9  CL 103 105  --   CO2 24 25  --   GLUCOSE 85 114*  --   BUN 13 9  --   CREATININE 0.91 0.79  --   CALCIUM 8.2* 7.9*  --    GFR: Estimated Creatinine Clearance: 66 mL/min (by C-G formula based on SCr of 0.79 mg/dL). Recent Labs  Lab 03/25/20 2129 03/26/20 0404  WBC 12.0* 9.6  LATICACIDVEN 1.3  --  Liver Function Tests: Recent Labs  Lab 03/25/20 2129 03/26/20 0404  AST 29 16  ALT 14 13  ALKPHOS 73 56  BILITOT 0.9 0.5  PROT 5.9* 5.1*  ALBUMIN 2.9* 2.4*   No results for input(s): LIPASE, AMYLASE in the last 168 hours. No results for input(s): AMMONIA in the last 168 hours.  ABG    Component Value Date/Time   HCO3 29.7 (H) 03/26/2020 0625   TCO2 31 03/26/2020 0625   O2SAT 99.0 03/26/2020 0625     Coagulation Profile: Recent Labs  Lab 03/25/20 2129  INR 1.1    Cardiac Enzymes: No results for input(s): CKTOTAL, CKMB, CKMBINDEX, TROPONINI in the last 168 hours.  HbA1C: No results found for: HGBA1C  CBG: No results for input(s): GLUCAP in the  last 168 hours.  Review of Systems:   Cannot assess  Past Medical History  She,  has a past medical history of Allergy (06/11/2018), Chicken pox (06/11/2018), Down syndrome (06/11/2018), Heart murmur (06/11/2018), Intellectual disability, and Myopia of both eyes.   Surgical History    Past Surgical History:  Procedure Laterality Date  . KNEE SURGERY  2013  . RADIOLOGY WITH ANESTHESIA N/A 06/12/2018   Procedure: MRI WITH ANESTHESIA;  Surgeon: Radiologist, Medication, MD;  Location: [place];  Service: Radiology;  Laterality: N/A;     Social History   reports that she has never smoked. She has never used smokeless tobacco. She reports that she does not drink alcohol or use drugs.   Family History   Her family history includes Cancer in her father, maternal grandmother, and paternal grandfather; Diabetes in her father.   Allergies No Known Allergies   Home Medications  Prior to Admission medications   Medication Sig Start Date End Date Taking? Authorizing Provider  acetaminophen (TYLENOL) 325 MG tablet Take 650 mg by mouth 2 (two) times daily.    Yes [provider]  Acetylcysteine (NAC) 600 MG CAPS Take 600 mg by mouth 2 (two) times daily.   Yes [provider]  aspirin EC 81 MG tablet Take 81 mg by mouth daily.   Yes [provider]  Calcium Carbonate (CALCIUM 600 PO) Take 600 mg by mouth 2 (two) times daily.   Yes [provider]  Cholecalciferol (VITAMIN D3) 2000 units TABS Take 2,000 Units by mouth daily.   Yes [provider]  clonazePAM (KLONOPIN) 0.5 MG tablet Take 0.5 mg by mouth daily. 03/14/20  Yes [provider]  donepezil (ARICEPT) 5 MG tablet Take 5 mg by mouth at bedtime. 03/17/20  Yes [provider]  levETIRAcetam (KEPPRA) 500 MG tablet Take 1 tablet (500 mg total) by mouth 2 (two) times daily. 09/25/18 03/25/20 Yes Rehman, Areeg N, DO  levothyroxine (SYNTHROID) 75 MCG tablet Take 75 mcg by mouth daily before  breakfast. 03/17/20  Yes [provider]  melatonin 5 MG TABS Take 5 mg by mouth at bedtime.   Yes [provider]  memantine (NAMENDA XR) 7 MG CP24 24 hr capsule Take 7 mg by mouth daily. 03/17/20  Yes [provider]  Olopatadine HCl (EYE ALLERGY ITCH RELIEF) 0.2 % SOLN Place 1 drop into both eyes 2 (two) times daily.   Yes [provider]  Omega-3 1000 MG CAPS Take 1,000 mg by mouth 2 (two) times daily.   Yes [provider]  simvastatin (ZOCOR) 20 MG tablet Take 20 mg by mouth every evening. 03/24/20  Yes [provider]  sodium fluoride (DENTA 5000 PLUS)  1.1 % CREA dental cream Place 1 application onto teeth at bedtime.   Yes [provider]  traZODone (DESYREL) 50 MG tablet Take 50 mg by mouth at bedtime. 03/17/20  Yes [provider]  triamcinolone ointment (KENALOG) 0.1 % Apply 1 application topically See admin instructions. Apply topically three times daily until blisters are healed. 03/07/20  Yes [provider]  pantoprazole (PROTONIX) 40 MG tablet Take 1 tablet (40 mg total) by mouth daily. Patient not taking: Reported on 08/16/2018 06/19/18 03/24/20  Alba Cory, MD

## 2020-03-27 LAB — COMPREHENSIVE METABOLIC PANEL
ALT: 12 U/L (ref 0–44)
AST: 18 U/L (ref 15–41)
Albumin: 2.4 g/dL — ABNORMAL LOW (ref 3.5–5.0)
Alkaline Phosphatase: 54 U/L (ref 38–126)
Anion gap: 8 (ref 5–15)
BUN: 8 mg/dL (ref 6–20)
CO2: 24 mmol/L (ref 22–32)
Calcium: 7.7 mg/dL — ABNORMAL LOW (ref 8.9–10.3)
Chloride: 106 mmol/L (ref 98–111)
Creatinine, Ser: 0.75 mg/dL (ref 0.44–1.00)
GFR calc Af Amer: >60 mL/min (ref >60)
GFR calc non Af Amer: >60 mL/min (ref >60)
Glucose, Bld: 83 mg/dL (ref 70–99)
Potassium: 4.3 mmol/L (ref 3.5–5.1)
Sodium: 138 mmol/L (ref 135–145)
Total Bilirubin: 0.9 mg/dL (ref 0.3–1.2)
Total Protein: 5.3 g/dL — ABNORMAL LOW (ref 6.5–8.1)

## 2020-03-27 LAB — CBC
HCT: 36.5 % (ref 36.0–46.0)
Hemoglobin: 12.2 g/dL (ref 12.0–15.0)
MCH: 32.8 pg (ref 26.0–34.0)
MCHC: 33.4 g/dL (ref 30.0–36.0)
MCV: 98.1 fL (ref 80.0–100.0)
Platelets: 167 10*3/uL (ref 150–400)
RBC: 3.72 MIL/uL — ABNORMAL LOW (ref 3.87–5.11)
RDW: 14.3 % (ref 11.5–15.5)
WBC: 6.4 10*3/uL (ref 4.0–10.5)
nRBC: 0 % (ref 0.0–0.2)

## 2020-03-27 LAB — HIV ANTIBODY (ROUTINE TESTING W REFLEX): HIV Screen 4th Generation wRfx: NONREACTIVE

## 2020-03-27 MED ORDER — MEMANTINE HCL ER 7 MG PO CP24
7.0000 mg | ORAL_CAPSULE | Freq: Every day | ORAL | Status: DC
  Administered 2020-03-27 – 2020-04-04 (×9): 7 mg via ORAL
  Filled 2020-03-27 (×9): qty 1

## 2020-03-27 MED ORDER — LEVETIRACETAM 500 MG PO TABS
500.0000 mg | ORAL_TABLET | ORAL | Status: DC
  Administered 2020-03-27 – 2020-04-04 (×16): 500 mg via ORAL
  Filled 2020-03-27 (×16): qty 1

## 2020-03-27 MED ORDER — SIMVASTATIN 20 MG PO TABS
20.0000 mg | ORAL_TABLET | Freq: Every evening | ORAL | Status: DC
  Administered 2020-03-27 – 2020-04-04 (×9): 20 mg via ORAL
  Filled 2020-03-27 (×9): qty 1

## 2020-03-27 MED ORDER — DONEPEZIL HCL 5 MG PO TABS
5.0000 mg | ORAL_TABLET | Freq: Every day | ORAL | Status: DC
  Administered 2020-03-27 – 2020-04-03 (×8): 5 mg via ORAL
  Filled 2020-03-27 (×8): qty 1

## 2020-03-27 MED ORDER — LEVOTHYROXINE SODIUM 75 MCG PO TABS
75.0000 ug | ORAL_TABLET | Freq: Every day | ORAL | Status: DC
  Administered 2020-03-28 – 2020-04-04 (×8): 75 ug via ORAL
  Filled 2020-03-27 (×8): qty 1

## 2020-03-27 MED ORDER — ADULT MULTIVITAMIN W/MINERALS CH
1.0000 | ORAL_TABLET | Freq: Every day | ORAL | Status: DC
  Administered 2020-03-27 – 2020-04-04 (×9): 1 via ORAL
  Filled 2020-03-27 (×9): qty 1

## 2020-03-27 MED ORDER — MELATONIN 5 MG PO TABS
5.0000 mg | ORAL_TABLET | Freq: Every day | ORAL | Status: DC
  Administered 2020-03-27 – 2020-04-03 (×8): 5 mg via ORAL
  Filled 2020-03-27 (×8): qty 1

## 2020-03-27 MED ORDER — CALCIUM CARBONATE ANTACID 500 MG PO CHEW
400.0000 mg | CHEWABLE_TABLET | Freq: Three times a day (TID) | ORAL | Status: DC
  Administered 2020-03-27 – 2020-04-04 (×25): 400 mg via ORAL
  Filled 2020-03-27 (×25): qty 2

## 2020-03-27 MED ORDER — CLONAZEPAM 0.25 MG PO TBDP
0.2500 mg | ORAL_TABLET | Freq: Every day | ORAL | Status: DC
  Administered 2020-03-27 – 2020-03-31 (×5): 0.25 mg via ORAL
  Filled 2020-03-27 (×5): qty 1

## 2020-03-27 NOTE — Significant Event (Addendum)
Rapid Response Event Note  Overview: Arrival Time: 1115 Event Type: Cardiac, Respiratory Pt having periods of apnea and bradycardia  While rounding on unit, the respiratory therapist informed me of patient having apneic events with bradycardia and oxygen desaturation. CPAP considered for patient, but patient is too drowsy to safely wear CPAP at this time. Primary RN at bedside and had paged attending regarding concerns. Pt's sister is at bedside. Per sister, pt is disoriented/confused at baseline and pt is bradycardic (50s bpm) at baseline. Pt does not appear distressed. Lung sounds are clear. Pt is intermittently snoring, per sister [name] snores while she sleeps. Pt opens eyes to voice, but does not sustain wakefulness. Pt is appropriately moving her UEs.   VS: BP 105/65, HR 55, RR 12, SpO2 97% on 2LNC  Interventions: -No intervention from RR RN  Plan of Care (if not transferred): -Continuous cardiac and pulse oximetry monitoring  -Titrate supplemental oxygen to meet patient's needs, SpO2 >92% -Monitor pt for increase somnolence, lethargy, drowsiness- notify provider or RR RN   -Per IMTS MD, pt is not a candidate for CPAP. Provide supplemental oxygen via nasal cannula at this time. Notify if periods of apnea become more prolonged, more frequent, or if VS do not recover once pt is awakened.   Event Summary: Name of Physician Notified: IMTS MD  Outcome: Stayed in room and stabalized   Jennye Moccasin

## 2020-03-27 NOTE — ED Provider Notes (Signed)
.  Critical Care Performed by: Terrilee Files, MD Authorized by: Terrilee Files, MD   Critical care provider statement:    Critical care time (minutes):  45   Critical care time was exclusive of:  Separately billable procedures and treating other patients   Critical care was necessary to treat or prevent imminent or life-threatening deterioration of the following conditions:  CNS failure or compromise   Critical care was time spent personally by me on the following activities:  Discussions with consultants, evaluation of patient's response to treatment, examination of patient, ordering and performing treatments and interventions, ordering and review of laboratory studies, ordering and review of radiographic studies, pulse oximetry, re-evaluation of patient's condition, obtaining history from patient or surrogate, review of old charts and development of treatment plan with patient or surrogate   I assumed direction of critical care for this patient from another provider in my specialty: no       Terrilee Files, MD 03/27/20 1115

## 2020-03-27 NOTE — Progress Notes (Addendum)
EC  03/27/20 1145  Assess: MEWS Score  BP 99/64  O2 Device Nasal Cannula  O2 Flow Rate (L/min) 2 L/min  Assess: if the MEWS score is Yellow or Red  Were vital signs taken at a resting state? Yes  Focused Assessment Documented focused assessment  Early Detection of Sepsis Score *See Row Information* Low  MEWS guidelines implemented *See Row Information* No, vital signs rechecked  Treat  MEWS Interventions Consulted Respiratory Therapy;Other (Comment) (Consulted with MD)  Take Vital Signs  Increase Vital Sign Frequency  Yellow: Q 2hr X 2 then Q 4hr X 2, if remains yellow, continue Q 4hrs  Escalate  MEWS: Escalate Yellow: discuss with charge nurse/RN and consider discussing with provider and RRT  Notify: Charge Nurse/RN  Name of Charge Nurse/RN Notified Wilford Corner, RN  Date Charge Nurse/RN Notified 03/27/20  Time Charge Nurse/RN Notified 1145  Notify: Provider  Provider Name/Title Levert Feinstein, MD  Date Provider Notified 03/27/20  Time Provider Notified 1145  Notification Type Page  Notification Reason Requested by patient/family;Other (Comment) (apneic with brady rhythm in 30s)  Date of Provider Response 03/27/20  Time of Provider Response 1157  Notify: Rapid Response  Name of Rapid Response RN Notified Mitzi Davenport, RN  Date Rapid Response Notified 03/27/20  Time Rapid Response Notified 1140  Document  Patient Outcome Other (Comment) (stable / not symptomatic )  Progress note created (see row info) Yes   MD called and notified of alteration in heart rhythm (SB in 30s) Pt asymptomatic, but having periods of apnea lasting 20 + seconds.  In deep sleep and snoring, but easily arousible.  Sister in attendance and states this is her norm.  Rapid response notified due to change in MEWS as well as MD.  O2 placed at 2 lpm. Decision made not to utilize CPAP due to patient intolerance.  Will continue to monitor.

## 2020-03-27 NOTE — Progress Notes (Signed)
CPAP brought to patient's bedside.  Patient was awake and was wearing hand mitts/restraints.  Discussed use of restraints with PPV with RN.  Informed that the restraints are required. Patient has no history of OSA.  PPV remains on SB.

## 2020-03-27 NOTE — Progress Notes (Signed)
Pt is not in restraints. Pt has on a mitten on her R hand because she pulled off her tele leads.

## 2020-03-27 NOTE — Progress Notes (Signed)
Family Medicine Teaching Service Daily Progress Note Intern Pager: [phone]  Patient name: Jasmine Ramirez Medical record number: [id number] Date of birth: 16-Jan-1966 Age: 54 y.o. Gender: female  Primary Care Provider: Trinidad Ramirez Consultants: Neuro Code Status: Full  Pt Overview and Major Events to Date:  5/10 admitted, hypoxic event  Assessment and Plan: Jasmine Ramirez is a 54 y.o. female presenting with drowsiness. PMH is significant for Down Syndrome, hypothyroidism, seizures, early onset alzheimer's, heart murmur (VSD).   Increased Drowsiness Patient presented for concern of increased lethargy, drowsiness over the weekend. She also had fallen out of bed on 5/8 and sustained two black eyes, CT head negative for acute process other than periorbital swelling. Patient recently had medication change to klonopin 0.5 mg daily from loarzepam 1mg  daily for agitation/violent behavior related to increasing dementia (benzodiazepines initiated in November, 2020). Family noticed that patient was much more drowsy and lethargic than normal: not alert, head slumped in wheel chair. Her baseline is to be alert, answer "yes," "no," or "ouch." She is wheelchair bound. This morning patient was alert, answering "yes" and "no," and had no focal deficits on exam. Per patient's mother she is at baseline today. Blood cultures are NG x2 days, urine culture NG x 1 day, all lab work WNL, and VSS (except when sleeping, see below). Plan to discontinue all antibiotics as there is no evidence for infectious process. Repeat head CT without any acute intracranial process. EEG with baseline static encephalopathy most likely due to dementia, no seizures or epileptiform activity. I suspect that patient may be more sensitive to klonopin than lorazepam, and benzodiazepines are not preferred in geriatric or dementia patients due to increased risk for delirium, sedation, falls, etc. Will start patient on a 1 week taper of  0.25mg  klonopin, and recommend that instead of benzodiazepines, second generation antipsychotics such as quetiapine be used for agitation or sundowning with dementia. -continuous pulse ox and cardiac monitoring - taper klonopin 0.25mg  daily x1 week and then cease - use 2nd generation antipsychotics for agitation or sundowning such as quetiapine -AM CBC, CMP -Vital signs per floor protocol -Tylenol as needed  OSA  Hypoxic Events Patient has returned to her neurologic baseline of answering yes and no, and saying "ouch." She is alert, and not oriented (baseline). VSS while awake. Patient notably has quite loud snoring, and when sleeping supine and flat can have apneic events where she desaturates to the 30s, and her MAP and HR can similarly drop. When patient is turned on her side, these events do not occur. Patient was examined after one such apneic event yesterday by CCM, who attribute the cause of the event to OSA. Attempted to use CPAP last night, but patient cannot tolerate it. She needed a mitten to prevent from removing leads and O2 tubing. Similarly, patient had a similar apneic event this morning for 30 seconds that resolved when the patient awoke and turned. Recommend patient sleep with O2 via [place] and at an inclined angle, and/or propped on her side, to prevent apneic events. Plan to touch base with CCM/Pulmonology to see if they have any other recommendations. -sleep with O2 via [place] -sleep at an angle, or on side -continuous pulse oximetry and cardiac monitoring  Early onset Alzheimer's  Down Syndrome  Patient with a history of early onset Alzheimer's and Down syndrome.  Patient currently lives in a group home.  Home medications include Namenda and Aricept. Minimally verbal at baseline, answers yes or no questions, can say ouch. At  baseline today, for more information see above in "increased drowsiness." - continue home Aricept -Continue Namenda 7 mg  Hypothyroidism  Home medications  include Synthroid 75 mcg -Continue home Synthroid -TSH within normal limits on admission  Hx of Seizures Patient on Keppra 500 mg twice daily.  No recent report of seizure activity. -Continue Keppra 500 mg twice daily  Hyperlipidemia Home medication includes simvastatin 20 mg daily. -Continue simvastatin  FEN/GI: soft diet PPx: lovenox  Disposition: observe overnight for hypoxic events, tolerance of klonopin taper, likely home tomorrow  Subjective:  Patient is alert and active this morning. She is at reported baseline of saying "yes" and "no." No longer as drowsy as before.   Objective: Temp:  [97.6 F (36.4 C)-98.5 F (36.9 C)] 98.5 F (36.9 C) (05/11 0454) Pulse Rate:  [39-72] 51 (05/11 0454) Resp:  [9-22] 16 (05/11 0454) BP: (68-121)/(27-86) 108/58 (05/11 0454) SpO2:  [72 %-100 %] 100 % (05/11 0454) Physical Exam: General: alert, older woman with DS, resting comfortably in bed, NAD Cardiovascular: RRR, no m/r/g Respiratory: CTAB, some stertorous upper airway sounds while supine Abdomen: soft, NT, ND Extremities: moving all 4 limbs, limited LUE movement due to chronically dislocated shoulder, warm and dry  Laboratory: Recent Labs  Lab 03/25/20 2129 03/25/20 2129 03/26/20 0404 03/26/20 0625 03/27/20 0309  WBC 12.0*  --  9.6  --  6.4  HGB 13.7   < > 12.1 12.6 12.2  HCT 40.8   < > 36.3 37.0 36.5  PLT 192  --  161  --  167   < > = values in this interval not displayed.   Recent Labs  Lab 03/25/20 2129 03/25/20 2129 03/26/20 0404 03/26/20 0625 03/27/20 0309  NA 134*   < > 135 136 138  K 5.4*   < > 3.9 3.9 4.3  CL 103  --  105  --  106  CO2 24  --  25  --  24  BUN 13  --  9  --  8  CREATININE 0.91  --  0.79  --  0.75  CALCIUM 8.2*  --  7.9*  --  7.7*  PROT 5.9*  --  5.1*  --  5.3*  BILITOT 0.9  --  0.5  --  0.9  ALKPHOS 73  --  56  --  54  ALT 14  --  13  --  12  AST 29  --  16  --  18  GLUCOSE 85  --  114*  --  83   < > = values in this interval  not displayed.   Imaging/Diagnostic Tests: EEG  Result Date: 03/26/2020 Jasmine Quest, MD     03/26/2020  5:18 PM Patient Name: Jasmine Ramirez MRN: [id number] Epilepsy Attending: Charlsie Ramirez Referring Physician/Provider: Dr. Towanda Octave Date: 03/26/2020 Duration: 23.55 mins Patient history: 54 old female with history of Down syndrome, seizures, early onset Alzheimer's presented with increased drowsiness.  EEG evaluate for seizures. Level of alertness: Awake AEDs during EEG study: Keppra Technical aspects: This EEG study was done with scalp electrodes positioned according to the 10-20 International system of electrode placement. Electrical activity was acquired at a sampling rate of 500Hz  and reviewed with a high frequency filter of 70Hz  and a low frequency filter of 1Hz . EEG data were recorded continuously and digitally stored. Description: No clear posterior dominant rhythm was seen. EEG showed continuous generalized 3-6Hz  theta-delta slowing.  Hyperventilation and photic stimulation were not performed. Of note,  eeg was technically difficult due to significant movement artifact.  Abnormality -Continuous slow, generalized  IMPRESSION: This technically difficult study is suggestive of moderate diffuse encephalopathy, nonspecific etiology but likely secondary to patient's baseline static encephalopathy. No seizures or epileptiform discharges were seen throughout the recording.  Jasmine Ramirez   CT HEAD WO CONTRAST  Result Date: 03/26/2020 CLINICAL DATA:  54 year old female with continued altered/poor mental status after a fall. Query subdural hematoma. EXAM: CT HEAD WITHOUT CONTRAST TECHNIQUE: Contiguous axial images were obtained from the base of the skull through the vertex without intravenous contrast. COMPARISON:  Head CT 03/24/2020, and earlier.  Brain MRI 06/11/2018. FINDINGS: Brain: Chronic cerebral volume loss and dystrophic calcifications in the basal ganglia and left cerebellum.  Cerebral volume and ventricle size have not significantly changed since 2019. No transependymal edema. No midline shift, ventriculomegaly, mass effect, evidence of mass lesion, intracranial hemorrhage or evidence of cortically based acute infarction. Stable gray-white matter differentiation throughout the brain. No definite cortical encephalomalacia. Vascular: Calcified atherosclerosis at the skull base. Skull: Stable and intact. Sinuses/Orbits: Visualized paranasal sinuses are stable and well pneumatized. Chronic mastoid opacity/sclerosis is stable. Left tympanic cavity opacification has increased since 2019. Other: No acute orbit or scalp soft tissue finding. IMPRESSION: 1. No acute intracranial abnormality or recent traumatic injury identified. 2. Stable non contrast CT appearance of the brain since 2019 with age advanced cerebral volume loss and probably ex vacuo ventricular enlargement. Electronically Signed   By: Odessa Fleming M.D.   On: 03/26/2020 12:11   CT Head Wo Contrast  Result Date: 03/24/2020 CLINICAL DATA:  Status post fall. EXAM: CT HEAD WITHOUT CONTRAST TECHNIQUE: Contiguous axial images were obtained from the base of the skull through the vertex without intravenous contrast. COMPARISON:  August 16, 2018 FINDINGS: Brain: There is moderate severity cerebral atrophy with widening of the extra-axial spaces and ventricular dilatation. There are areas of decreased attenuation within the white matter tracts of the supratentorial brain, consistent with microvascular disease changes. There is bilateral symmetric basal ganglia calcification. Vascular: No hyperdense vessels are identified. Skull: Normal. Negative for fracture or focal lesion. Sinuses/Orbits: No acute finding. Other: There is mild right periorbital and mild right frontal scalp soft tissue swelling. IMPRESSION: 1. Mild right periorbital and mild right frontal scalp soft tissue swelling. 2. No acute intracranial abnormality. Electronically  Signed   By: Aram Candela M.D.   On: 03/24/2020 18:37   CT Cervical Spine Wo Contrast  Result Date: 03/24/2020 CLINICAL DATA:  Status post fall with altered mental status. EXAM: CT CERVICAL SPINE WITHOUT CONTRAST TECHNIQUE: Multidetector CT imaging of the cervical spine was performed without intravenous contrast. Multiplanar CT image reconstructions were also generated. COMPARISON:  June 11, 2018 FINDINGS: Please note that the study is severely limited due to motion artifact, despite of repeated attempts. Alignment: Minimal retrolistheses of C2 on C3 and minimal anterolisthesis of C4 on C5. Reversal of cervical lordosis. Skull base and vertebrae: No evidence of fracture, accounting for the limitations of this study. Soft tissues and spinal canal: No prevertebral fluid or swelling. No visible canal hematoma. Disc levels: Multilevel osteoarthritic changes and posterior facet arthropathy. Upper chest: Negative. Other: None. IMPRESSION: 1. Please note that this study is severely limited due to motion artifact, despite of repeated scanning attempts. No evidence of fracture, accounting for these limitations. 2. Minimal chronic retrolistheses of C2 on C3 and minimal chronic anterolisthesis of C4 on C5, degenerative in etiology. 3. Multilevel osteoarthritic changes and posterior facet arthropathy. Electronically Signed  By: Ted Mcalpine M.D.   On: 03/24/2020 18:20   DG Chest Port 1 View  Result Date: 03/25/2020 CLINICAL DATA:  54 year old female with hypothermia. EXAM: PORTABLE CHEST 1 VIEW COMPARISON:  Chest radiograph dated 03/07/2020. FINDINGS: Minimal bilateral mid to lower lung field interstitial densities may represent atelectatic changes. Atypical infection is less likely. Clinical correlation is recommended. No focal consolidation, pleural effusion or pneumothorax. Stable cardiac silhouette. No acute osseous pathology. Sclerotic lesion in the proximal left humeral diaphysis suboptimally  characterized but may represent a chondroid lesion such as an enchondroma. This can be better characterized by MRI on a nonemergent/outpatient basis. IMPRESSION: 1. No focal consolidation. 2. Minimal bilateral mid to lower lung field interstitial densities may represent atelectatic changes. Electronically Signed   By: Elgie Collard M.D.   On: 03/25/2020 21:32   Shirlean Mylar, MD 03/27/2020, 6:49 AM PGY-1, [facility] FPTS Intern pager: [phone], text pages welcome

## 2020-03-28 DIAGNOSIS — Z6835 Body mass index (BMI) 35.0-35.9, adult: Secondary | ICD-10-CM | POA: Diagnosis not present

## 2020-03-28 DIAGNOSIS — G4700 Insomnia, unspecified: Secondary | ICD-10-CM | POA: Diagnosis not present

## 2020-03-28 DIAGNOSIS — K5900 Constipation, unspecified: Secondary | ICD-10-CM | POA: Diagnosis not present

## 2020-03-28 DIAGNOSIS — J309 Allergic rhinitis, unspecified: Secondary | ICD-10-CM | POA: Diagnosis not present

## 2020-03-28 DIAGNOSIS — R350 Frequency of micturition: Secondary | ICD-10-CM | POA: Diagnosis not present

## 2020-03-28 DIAGNOSIS — F79 Unspecified intellectual disabilities: Secondary | ICD-10-CM | POA: Diagnosis not present

## 2020-03-28 DIAGNOSIS — Q909 Down syndrome, unspecified: Secondary | ICD-10-CM | POA: Diagnosis not present

## 2020-03-28 DIAGNOSIS — R4182 Altered mental status, unspecified: Secondary | ICD-10-CM

## 2020-03-28 DIAGNOSIS — E559 Vitamin D deficiency, unspecified: Secondary | ICD-10-CM | POA: Diagnosis not present

## 2020-03-28 DIAGNOSIS — E785 Hyperlipidemia, unspecified: Secondary | ICD-10-CM | POA: Diagnosis not present

## 2020-03-28 DIAGNOSIS — R531 Weakness: Secondary | ICD-10-CM

## 2020-03-28 DIAGNOSIS — K219 Gastro-esophageal reflux disease without esophagitis: Secondary | ICD-10-CM | POA: Diagnosis not present

## 2020-03-28 LAB — BASIC METABOLIC PANEL
Anion gap: 5 (ref 5–15)
BUN: 12 mg/dL (ref 6–20)
CO2: 26 mmol/L (ref 22–32)
Calcium: 8.2 mg/dL — ABNORMAL LOW (ref 8.9–10.3)
Chloride: 106 mmol/L (ref 98–111)
Creatinine, Ser: 0.83 mg/dL (ref 0.44–1.00)
GFR calc Af Amer: >60 mL/min (ref >60)
GFR calc non Af Amer: >60 mL/min (ref >60)
Glucose, Bld: 110 mg/dL — ABNORMAL HIGH (ref 70–99)
Potassium: 4.2 mmol/L (ref 3.5–5.1)
Sodium: 137 mmol/L (ref 135–145)

## 2020-03-28 LAB — CBC
HCT: 36.8 % (ref 36.0–46.0)
Hemoglobin: 12.2 g/dL (ref 12.0–15.0)
MCH: 32.7 pg (ref 26.0–34.0)
MCHC: 33.2 g/dL (ref 30.0–36.0)
MCV: 98.7 fL (ref 80.0–100.0)
Platelets: 164 10*3/uL (ref 150–400)
RBC: 3.73 MIL/uL — ABNORMAL LOW (ref 3.87–5.11)
RDW: 14.6 % (ref 11.5–15.5)
WBC: 5.4 10*3/uL (ref 4.0–10.5)
nRBC: 0 % (ref 0.0–0.2)

## 2020-03-28 NOTE — Plan of Care (Signed)
  Problem: Clinical Measurements: Goal: Will remain free from infection Outcome: Progressing   Problem: Safety: Goal: Ability to remain free from injury will improve Outcome: Progressing   

## 2020-03-28 NOTE — Progress Notes (Signed)
vFamily Medicine Teaching Service Daily Progress Note Intern Pager: [phone]  Patient name: Jasmine Ramirez Medical record number: [id number] Date of birth: Jan 07, 1966 Age: 54 y.o. Gender: female  Primary Care Provider: Trinidad Curet Consultants: Neuro Code Status: Full  Pt Overview and Major Events to Date:  5/10 admitted, hypoxic event  Assessment and Plan: Jasmine Ramirez is a 54 y.o. female presenting with drowsiness. PMH is significant for Down Syndrome, hypothyroidism, seizures, early onset alzheimer's, heart murmur (VSD).   Increased Drowsiness Patient had one brief episode of asystole for 2.3 seconds, HR in 30s, asymptomatic, was sleeping soundly, secondary to OSA, baseline for patient. Otherwise at her neurologic baseline, vitals now stable. Group home able to take patient tomorrow, plan for discharge then. Patient presented for concern of increased lethargy, drowsiness over the weekend. She also had fallen out of bed on 5/8 and sustained two black eyes, CT head negative for acute process other than periorbital swelling. Patient recently had medication change to klonopin 0.5 mg daily from loarzepam 1mg  daily for agitation/violent behavior related to increasing dementia (benzodiazepines initiated in November, 2020). Family noticed that patient was much more drowsy and lethargic than normal: not alert, head slumped in wheel chair. Her baseline is to be alert, answer "yes," "no," or "ouch." She is wheelchair bound. This morning patient was alert, answering "yes" and "no," and had no focal deficits on exam. Per patient's mother she is at baseline today. Blood cultures are NG x2 days, urine culture NG x 1 day, all lab work WNL, and VSS (except when sleeping, see below). Plan to discontinue all antibiotics as there is no evidence for infectious process. Repeat head CT without any acute intracranial process. EEG with baseline static encephalopathy most likely due to dementia, no  seizures or epileptiform activity. I suspect that patient may be more sensitive to klonopin than lorazepam, and benzodiazepines are not preferred in geriatric or dementia patients due to increased risk for delirium, sedation, falls, etc. Will start patient on a 1 week taper of 0.25mg  klonopin, and recommend that instead of benzodiazepines, second generation antipsychotics such as quetiapine be used for agitation or sundowning with dementia. -continuous pulse ox and cardiac monitoring - taper klonopin 0.25mg  daily x1 week and then cease - use 2nd generation antipsychotics for agitation or sundowning such as quetiapine -AM CBC, CMP -Vital signs per floor protocol -Tylenol as needed  OSA  Hypoxic Events Patient has returned to her neurologic baseline of answering yes and no, and saying "ouch." She is alert, and not oriented (baseline). VSS while awake, see above. Patient notably has quite loud snoring, and when sleeping supine and flat can have apneic events where she desaturates to the 30s, and her MAP and HR can similarly drop. When patient is turned on her side, these events do not occur. Patient was examined after one such apneic event yesterday by CCM, who attribute the cause of the event to OSA. Attempted to use CPAP last night, but patient cannot tolerate it. She needed a mitten to prevent from removing leads and O2 tubing. Similarly, patient had a similar apneic event this morning for 30 seconds that resolved when the patient awoke and turned. Recommend patient sleep with O2 via [place] and at an inclined angle, and/or propped on her side, to prevent apneic events. Plan to touch base with CCM/Pulmonology to see if they have any other recommendations. -sleep with O2 via [place] -sleep at an angle, or on side -continuous pulse oximetry and cardiac monitoring  Early onset  Alzheimer's  Down Syndrome  Patient with a history of early onset Alzheimer's and Down syndrome.  Patient currently lives in a group  home.  Home medications include Namenda and Aricept. Minimally verbal at baseline, answers yes or no questions, can say ouch. At baseline today, for more information see above in "increased drowsiness." Recommend that family follow with palliative care outpatient to assess goals of care. - continue home Aricept -Continue Namenda 7 mg  Hypothyroidism  Home medications include Synthroid 75 mcg -Continue home Synthroid -TSH within normal limits on admission  Hx of Seizures Patient on Keppra 500 mg twice daily.  No recent report of seizure activity. -Continue Keppra 500 mg twice daily  Hyperlipidemia Home medication includes simvastatin 20 mg daily. -Continue simvastatin  FEN/GI: soft diet PPx: lovenox  Disposition: home today  Subjective:  Patient is alert and active this morning. She is at reported baseline of saying "yes" and "no." No longer as drowsy as before. Brief episode of asystole on cardiac monitoring last night for 2.3 seconds. She was sleeping and snoring loudly at the time. Believe this is related to her OSA.  Objective: Temp:  [97.7 F (36.5 C)-98.2 F (36.8 C)] 97.7 F (36.5 C) (05/12 0322) Pulse Rate:  [40-63] 56 (05/12 0322) Resp:  [10-16] 13 (05/12 0322) BP: (90-128)/(44-111) 107/55 (05/12 0322) SpO2:  [92 %-100 %] 97 % (05/12 0322) Physical Exam: General: alert, older woman with DS, resting comfortably in bed, NAD Cardiovascular: RRR, no m/r/g Respiratory: CTAB, some stertorous upper airway sounds while supine Abdomen: soft, NT, ND Extremities: moving all 4 limbs, limited LUE movement due to chronically dislocated shoulder, warm and dry  Laboratory: Recent Labs  Lab 03/25/20 2129 03/25/20 2129 03/26/20 0404 03/26/20 0625 03/27/20 0309  WBC 12.0*  --  9.6  --  6.4  HGB 13.7   < > 12.1 12.6 12.2  HCT 40.8   < > 36.3 37.0 36.5  PLT 192  --  161  --  167   < > = values in this interval not displayed.   Recent Labs  Lab 03/25/20 2129  03/25/20 2129 03/26/20 0404 03/26/20 0625 03/27/20 0309  NA 134*   < > 135 136 138  K 5.4*   < > 3.9 3.9 4.3  CL 103  --  105  --  106  CO2 24  --  25  --  24  BUN 13  --  9  --  8  CREATININE 0.91  --  0.79  --  0.75  CALCIUM 8.2*  --  7.9*  --  7.7*  PROT 5.9*  --  5.1*  --  5.3*  BILITOT 0.9  --  0.5  --  0.9  ALKPHOS 73  --  56  --  54  ALT 14  --  13  --  12  AST 29  --  16  --  18  GLUCOSE 85  --  114*  --  83   < > = values in this interval not displayed.   Imaging/Diagnostic Tests: EEG  Result Date: 03/26/2020 Charlsie Quest, MD     03/26/2020  5:18 PM Patient Name: Jasmine Ramirez MRN: [id number] Epilepsy Attending: Charlsie Quest Referring Physician/Provider: Dr. Towanda Octave Date: 03/26/2020 Duration: 23.55 mins Patient history: 52 old female with history of Down syndrome, seizures, early onset Alzheimer's presented with increased drowsiness.  EEG evaluate for seizures. Level of alertness: Awake AEDs during EEG study: Keppra Technical aspects: This EEG study  was done with scalp electrodes positioned according to the 10-20 International system of electrode placement. Electrical activity was acquired at a sampling rate of 500Hz  and reviewed with a high frequency filter of 70Hz  and a low frequency filter of 1Hz . EEG data were recorded continuously and digitally stored. Description: No clear posterior dominant rhythm was seen. EEG showed continuous generalized 3-6Hz  theta-delta slowing.  Hyperventilation and photic stimulation were not performed. Of note, eeg was technically difficult due to significant movement artifact.  Abnormality -Continuous slow, generalized  IMPRESSION: This technically difficult study is suggestive of moderate diffuse encephalopathy, nonspecific etiology but likely secondary to patient's baseline static encephalopathy. No seizures or epileptiform discharges were seen throughout the recording.    CT HEAD WO CONTRAST  Result Date:  03/26/2020 CLINICAL DATA:  54 year old female with continued altered/poor mental status after a fall. Query subdural hematoma. EXAM: CT HEAD WITHOUT CONTRAST TECHNIQUE: Contiguous axial images were obtained from the base of the skull through the vertex without intravenous contrast. COMPARISON:  Head CT 03/24/2020, and earlier.  Brain MRI 06/11/2018. FINDINGS: Brain: Chronic cerebral volume loss and dystrophic calcifications in the basal ganglia and left cerebellum. Cerebral volume and ventricle size have not significantly changed since 2019. No transependymal edema. No midline shift, ventriculomegaly, mass effect, evidence of mass lesion, intracranial hemorrhage or evidence of cortically based acute infarction. Stable gray-white matter differentiation throughout the brain. No definite cortical encephalomalacia. Vascular: Calcified atherosclerosis at the skull base. Skull: Stable and intact. Sinuses/Orbits: Visualized paranasal sinuses are stable and well pneumatized. Chronic mastoid opacity/sclerosis is stable. Left tympanic cavity opacification has increased since 2019. Other: No acute orbit or scalp soft tissue finding. IMPRESSION: 1. No acute intracranial abnormality or recent traumatic injury identified. 2. Stable non contrast CT appearance of the brain since 2019 with age advanced cerebral volume loss and probably ex vacuo ventricular enlargement. Electronically Signed   By: 2020 M.D.   On: 03/26/2020 12:11   CT Head Wo Contrast  Result Date: 03/24/2020 CLINICAL DATA:  Status post fall. EXAM: CT HEAD WITHOUT CONTRAST TECHNIQUE: Contiguous axial images were obtained from the base of the skull through the vertex without intravenous contrast. COMPARISON:  August 16, 2018 FINDINGS: Brain: There is moderate severity cerebral atrophy with widening of the extra-axial spaces and ventricular dilatation. There are areas of decreased attenuation within the white matter tracts of the supratentorial brain,  consistent with microvascular disease changes. There is bilateral symmetric basal ganglia calcification. Vascular: No hyperdense vessels are identified. Skull: Normal. Negative for fracture or focal lesion. Sinuses/Orbits: No acute finding. Other: There is mild right periorbital and mild right frontal scalp soft tissue swelling. IMPRESSION: 1. Mild right periorbital and mild right frontal scalp soft tissue swelling. 2. No acute intracranial abnormality. Electronically Signed   By: 05/26/2020 M.D.   On: 03/24/2020 18:37   CT Cervical Spine Wo Contrast  Result Date: 03/24/2020 CLINICAL DATA:  Status post fall with altered mental status. EXAM: CT CERVICAL SPINE WITHOUT CONTRAST TECHNIQUE: Multidetector CT imaging of the cervical spine was performed without intravenous contrast. Multiplanar CT image reconstructions were also generated. COMPARISON:  June 11, 2018 FINDINGS: Please note that the study is severely limited due to motion artifact, despite of repeated attempts. Alignment: Minimal retrolistheses of C2 on C3 and minimal anterolisthesis of C4 on C5. Reversal of cervical lordosis. Skull base and vertebrae: No evidence of fracture, accounting for the limitations of this study. Soft tissues and spinal canal: No prevertebral fluid or swelling.  No visible canal hematoma. Disc levels: Multilevel osteoarthritic changes and posterior facet arthropathy. Upper chest: Negative. Other: None. IMPRESSION: 1. Please note that this study is severely limited due to motion artifact, despite of repeated scanning attempts. No evidence of fracture, accounting for these limitations. 2. Minimal chronic retrolistheses of C2 on C3 and minimal chronic anterolisthesis of C4 on C5, degenerative in etiology. 3. Multilevel osteoarthritic changes and posterior facet arthropathy. Electronically Signed   By: Ted Mcalpine M.D.   On: 03/24/2020 18:20   DG Chest Port 1 View  Result Date: 03/25/2020 CLINICAL DATA:  55 year old  female with hypothermia. EXAM: PORTABLE CHEST 1 VIEW COMPARISON:  Chest radiograph dated 03/07/2020. FINDINGS: Minimal bilateral mid to lower lung field interstitial densities may represent atelectatic changes. Atypical infection is less likely. Clinical correlation is recommended. No focal consolidation, pleural effusion or pneumothorax. Stable cardiac silhouette. No acute osseous pathology. Sclerotic lesion in the proximal left humeral diaphysis suboptimally characterized but may represent a chondroid lesion such as an enchondroma. This can be better characterized by MRI on a nonemergent/outpatient basis. IMPRESSION: 1. No focal consolidation. 2. Minimal bilateral mid to lower lung field interstitial densities may represent atelectatic changes. Electronically Signed   By: Elgie Collard M.D.   On: 03/25/2020 21:32   Shirlean Mylar, MD 03/28/2020, 6:36 AM PGY-1, [facility] Family Medicine FPTS Intern pager: [phone], text pages welcome

## 2020-03-28 NOTE — TOC Progression Note (Signed)
Transition of Care [facility]) - Progression Note    Patient Details  Name: Jasmine Ramirez MRN: [id number] Date of Birth: 09/17/1966  [facility] CM/SW Contact  Terrilee Croak, Student-Social Work Phone Number: 03/28/2020, 1:06 PM  Clinical Narrative:    MSW Intern spoke with pt's sister who confirmed they would like her to return to [facility] group home after discharge. Number for the house was given ([phone]) and SW was redirected to nursing ([phone]) and spoke with teresa. She noted they will need updated notes and other consults before she is discharged, Fax # is [phone]. She also noted that pt has a case Production designer, theatre/television/film, Bobbye Riggs, who can be reached at the nursing number. She would like to be contacted when we have therapies and recomendations. SW will follow for disposition and discharge recommendations.        Expected Discharge Plan and Services                                                 Social Determinants of Health (SDOH) Interventions    Readmission Risk Interventions No flowsheet data found.

## 2020-03-28 NOTE — Progress Notes (Signed)
   03/28/20 1000  Assess: MEWS Score  BP (!) 85/46  Pulse Rate (!) 34  ECG Heart Rate (!) 34  Resp 12  SpO2 100 %  Assess: MEWS Score  MEWS Temp 0  MEWS Systolic 1  MEWS Pulse 2  MEWS RR 1  MEWS LOC 0  MEWS Score 4  MEWS Score Color Red  Assess: if the MEWS score is Yellow or Red  Were vital signs taken at a resting state? Yes  Focused Assessment Documented focused assessment  Early Detection of Sepsis Score *See Row Information* Low  MEWS guidelines implemented *See Row Information* Yes  Treat  MEWS Interventions Other (Comment) (MD C.MAHONEY AWARE; states this is pt baseline)  Take Vital Signs  Increase Vital Sign Frequency  Red: Q 1hr X 4 then Q 4hr X 4, if remains red, continue Q 4hrs  Escalate  MEWS: Escalate Red: discuss with charge nurse/RN and provider, consider discussing with RRT  Notify: Charge Nurse/RN  Name of Charge Nurse/RN Notified kelsey  Date Charge Nurse/RN Notified 03/28/20  Notify: Provider  Provider Name/Title c. mahoney,MD  Date Provider Notified 03/28/20  Time Provider Notified 1005  Notification Type Face-to-face  Notification Reason Change in status  Response No new orders  Date of Provider Response 03/28/20  Time of Provider Response 1005  Document  Patient Outcome Other (Comment) (NO CHANGES)  Progress note created (see row info) Yes   DOCTOR NOTIFIED X2 about change in pt status at bedside. No new orders given. Will continue to monitor pt.

## 2020-03-28 NOTE — Evaluation (Addendum)
Clinical/Bedside Swallow Evaluation Patient Details  Name: Jasmine Ramirez MRN: [id number] Date of Birth: 1966-06-06  Today's Date: 03/28/2020 Time: SLP Start Time (ACUTE ONLY): 1410 SLP Stop Time (ACUTE ONLY): 1426 SLP Time Calculation (min) (ACUTE ONLY): 16 min  Past Medical History:  Past Medical History:  Diagnosis Date  . Allergy 06/11/2018  . Chicken pox 06/11/2018  . Down syndrome 06/11/2018  . Heart murmur 06/11/2018  . Intellectual disability   . Myopia of both eyes    Past Surgical History:  Past Surgical History:  Procedure Laterality Date  . KNEE SURGERY  2013  . RADIOLOGY WITH ANESTHESIA N/A 06/12/2018   Procedure: MRI WITH ANESTHESIA;  Surgeon: Radiologist, Medication, MD;  Location: [place];  Service: Radiology;  Laterality: N/A;   HPI:  Jasmine Ramirez is a 54 y.o. female presenting with drowsiness. PMH is significant for Down Syndrome, hypothyroidism, seizures, early onset alzheimer's, heart murmur (VSD). Patient is reported to have been increasingly drowsy for 1 day following a fall in which she sustained multiple forehead bruises.  Recent head CT on 5/8 without traumatic injury.  Differential includes underlying infection given the patient's hypothermia, decreased alertness. H/o GERD.   Assessment / Plan / Recommendation Clinical Impression  Jasmine Ramirez was seen for a clinical swallow evaluation with sister present to aid as historian, as pt is largely nonverbal. They deny any swallowing difficulty, but report they typically cut her foods up into bite size at baseline. She was unable to fully participate in oral mech/cranial nerve exam 2/2 difficulty folowing directions, however structures appear symmetrical with no abnormalities. Oral care appeared adequate. She consumed thin liquids (soda) via straw and regular consistency graham cracker without difficulty. Pt had no s/s aspiration, but did have consistent eructations after sips of soda; sister reports this is  typical for her (consistent with GERD). CXR is negative for PNA. Pt/family have no concerns for swallow function.   Recommend: continue thin liquids and soft solids, meds as tolerated, no further ST indicated.    SLP Visit Diagnosis: Dysphagia, unspecified (R13.10)    Aspiration Risk  Mild aspiration risk    Diet Recommendation Thin liquid;Dysphagia 3 (Mech soft)   Liquid Administration via: Straw;Cup Medication Administration: Whole meds with puree Supervision: Staff to assist with self feeding Compensations: Small sips/bites;Follow solids with liquid Postural Changes: Seated upright at 90 degrees    Other  Recommendations Oral Care Recommendations: Oral care BID   Follow up Recommendations None        Swallow Study   General Date of Onset: 03/26/20 HPI: Jasmine Ramirez is a 54 y.o. female presenting with drowsiness. PMH is significant for Down Syndrome, hypothyroidism, seizures, early onset alzheimer's, heart murmur (VSD). Patient is reported to have been increasingly drowsy for 1 day following a fall in which she sustained multiple forehead bruises.  Recent head CT on 5/8 without traumatic injury.  Differential includes underlying infection given the patient's hypothermia, decreased alertness.   Type of Study: Bedside Swallow Evaluation Diet Prior to this Study: Dysphagia 3 (soft);Thin liquids Temperature Spikes Noted: No Respiratory Status: Room air History of Recent Intubation: No Behavior/Cognition: Alert;Cooperative;Pleasant mood;Requires cueing Oral Cavity Assessment: Within Functional Limits Oral Care Completed by SLP: Recent completion by staff Oral Cavity - Dentition: Adequate natural dentition Self-Feeding Abilities: Needs assist Patient Positioning: Upright in bed Baseline Vocal Quality: Normal    Oral/Motor/Sensory Function Overall Oral Motor/Sensory Function: Other (comment)(formal eval not completed 2/2 inability to follow directions)   Thin Liquid Thin  Liquid: Within functional limits  Presentation: Straw;Cup    Solid     Solid: Within functional limits     [place] Jasmine Ramirez, M.S., CCC-SLP Speech-Language Pathologist Acute Rehabilitation Services Pager: [phone]  Jasmine Ramirez Jasmine Ramirez 03/28/2020,2:31 PM

## 2020-03-29 DIAGNOSIS — G4733 Obstructive sleep apnea (adult) (pediatric): Secondary | ICD-10-CM

## 2020-03-29 DIAGNOSIS — R531 Weakness: Secondary | ICD-10-CM

## 2020-03-29 DIAGNOSIS — F0280 Dementia in other diseases classified elsewhere without behavioral disturbance: Secondary | ICD-10-CM

## 2020-03-29 DIAGNOSIS — G300 Alzheimer's disease with early onset: Secondary | ICD-10-CM

## 2020-03-29 MED ORDER — CLONAZEPAM 0.25 MG PO TBDP: 0.2500 mg | ORAL_TABLET | Freq: Every day | ORAL | 0 refills | Status: AC

## 2020-03-29 NOTE — Social Work (Signed)
CSW faxed discharge summary to [facility]. CSW was informed by patient's sister Jasmine Ramirez that the group home stated a higher level of care is needed and are seeking a different placement for patient. CSW updated RN and MD of the change and is continuing to follow and assist with discharge planning needs.  [employer], [address]

## 2020-03-29 NOTE — Care Management Important Message (Signed)
Important Message  Patient Details  Name: Jasmine Ramirez MRN: [id number] Date of Birth: [birth year]   Medicare Important Message Given:  Yes     Girtrude Enslin Stefan Church 03/29/2020, 3:48 PM

## 2020-03-29 NOTE — Discharge Instructions (Signed)
You were treated for sedation, which we believe is due to klonopin. We recommend doing a 1 week taper of klonopin to prevent withdrawal (to end 5/17). We recommend avoiding use of benzodiazepines in people with dementia as they can cause sedation, confusion, increase falls, and make breathing more difficult. You also have obstructive sleep apnea. As you cannot where a CPAP machine, we recommend sleeping with the head of the bed upright (45*-90* angle, to reduce snoring). This will help keep your airway open. To qualify for night time oxygen, we recommend you get a sleep study done as an outpatient, a referral has been placed, you can also ask for this referral from your primary care provider. Also to help manage chronic conditions, we have placed a referral to palliative care outpatient.  Please return to the hospital or go to your nearest emergency department if you have difficulty breathing that doesn't improve with waking up/side-lying/sitting up right, increased confusion or sleepiness, change in behavior, fever with temperature >100.4*F, not eating or drinking for >24 hours.  Sleep Apnea Sleep apnea affects breathing during sleep. It causes breathing to stop for a short time or to become shallow. It can also increase the risk of:  Heart attack.  Stroke.  Being very overweight (obese).  Diabetes.  Heart failure.  Irregular heartbeat. The goal of treatment is to help you breathe normally again. What are the causes? There are three kinds of sleep apnea:  Obstructive sleep apnea. This is caused by a blocked or collapsed airway.  Central sleep apnea. This happens when the brain does not send the right signals to the muscles that control breathing.  Mixed sleep apnea. This is a combination of obstructive and central sleep apnea. The most common cause of this condition is a collapsed or blocked airway. This can happen if:  Your throat muscles are too relaxed.  Your tongue and tonsils are  too large.  You are overweight.  Your airway is too small. What increases the risk?  Being overweight.  Smoking.  Having a small airway.  Being older.  Being female.  Drinking alcohol.  Taking medicines to calm yourself (sedatives or tranquilizers).  Having family members with the condition. What are the signs or symptoms?  Trouble staying asleep.  Being sleepy or tired during the day.  Getting angry a lot.  Loud snoring.  Headaches in the morning.  Not being able to focus your mind (concentrate).  Forgetting things.  Less interest in sex.  Mood swings.  Personality changes.  Feelings of sadness (depression).  Waking up a lot during the night to pee (urinate).  Dry mouth.  Sore throat. How is this diagnosed?  Your medical history.  A physical exam.  A test that is done when you are sleeping (sleep study). The test is most often done in a sleep lab but may also be done at home. How is this treated?   Sleeping on your side.  Using a medicine to get rid of mucus in your nose (decongestant).  Avoiding the use of alcohol, medicines to help you relax, or certain pain medicines (narcotics).  Losing weight, if needed.  Changing your diet.  Not smoking.  Using a machine to open your airway while you sleep, such as: ? An oral appliance. This is a mouthpiece that shifts your lower jaw forward. ? A CPAP device. This device blows air through a mask when you breathe out (exhale). ? An EPAP device. This has valves that you put in each  nostril. ? A BPAP device. This device blows air through a mask when you breathe in (inhale) and breathe out.  Having surgery if other treatments do not work. It is important to get treatment for sleep apnea. Without treatment, it can lead to:  High blood pressure.  Coronary artery disease.  In men, not being able to have an erection (impotence).  Reduced thinking ability. Follow these instructions at  home: Lifestyle  Make changes that your doctor recommends.  Eat a healthy diet.  Lose weight if needed.  Avoid alcohol, medicines to help you relax, and some pain medicines.  Do not use any products that contain nicotine or tobacco, such as cigarettes, e-cigarettes, and chewing tobacco. If you need help quitting, ask your doctor. General instructions  Take over-the-counter and prescription medicines only as told by your doctor.  If you were given a machine to use while you sleep, use it only as told by your doctor.  If you are having surgery, make sure to tell your doctor you have sleep apnea. You may need to bring your device with you.  Keep all follow-up visits as told by your doctor. This is important. Contact a doctor if:  The machine that you were given to use during sleep bothers you or does not seem to be working.  You do not get better.  You get worse. Get help right away if:  Your chest hurts.  You have trouble breathing in enough air.  You have an uncomfortable feeling in your back, arms, or stomach.  You have trouble talking.  One side of your body feels weak.  A part of your face is hanging down. These symptoms may be an emergency. Do not wait to see if the symptoms will go away. Get medical help right away. Call your local emergency services (911 in the [place] Do not drive yourself to the hospital. Summary  This condition affects breathing during sleep.  The most common cause is a collapsed or blocked airway.  The goal of treatment is to help you breathe normally while you sleep. This information is not intended to replace advice given to you by your health care provider. Make sure you discuss any questions you have with your health care provider. Document Revised: 08/20/2018 Document Reviewed: 06/29/2018 Elsevier Patient Education  2020 [employer]    Sleep Apnea Sleep apnea affects breathing during sleep. It causes breathing to stop for a  short time or to become shallow. It can also increase the risk of:  Heart attack.  Stroke.  Being very overweight (obese).  Diabetes.  Heart failure.  Irregular heartbeat. The goal of treatment is to help you breathe normally again. What are the causes? There are three kinds of sleep apnea:  Obstructive sleep apnea. This is caused by a blocked or collapsed airway.  Central sleep apnea. This happens when the brain does not send the right signals to the muscles that control breathing.  Mixed sleep apnea. This is a combination of obstructive and central sleep apnea. The most common cause of this condition is a collapsed or blocked airway. This can happen if:  Your throat muscles are too relaxed.  Your tongue and tonsils are too large.  You are overweight.  Your airway is too small. What increases the risk?  Being overweight.  Smoking.  Having a small airway.  Being older.  Being female.  Drinking alcohol.  Taking medicines to calm yourself (sedatives or tranquilizers).  Having family members with the condition.  What are the signs or symptoms?  Trouble staying asleep.  Being sleepy or tired during the day.  Getting angry a lot.  Loud snoring.  Headaches in the morning.  Not being able to focus your mind (concentrate).  Forgetting things.  Less interest in sex.  Mood swings.  Personality changes.  Feelings of sadness (depression).  Waking up a lot during the night to pee (urinate).  Dry mouth.  Sore throat. How is this diagnosed?  Your medical history.  A physical exam.  A test that is done when you are sleeping (sleep study). The test is most often done in a sleep lab but may also be done at home. How is this treated?   Sleeping on your side.  Using a medicine to get rid of mucus in your nose (decongestant).  Avoiding the use of alcohol, medicines to help you relax, or certain pain medicines (narcotics).  Losing weight, if  needed.  Changing your diet.  Not smoking.  Using a machine to open your airway while you sleep, such as: ? An oral appliance. This is a mouthpiece that shifts your lower jaw forward. ? A CPAP device. This device blows air through a mask when you breathe out (exhale). ? An EPAP device. This has valves that you put in each nostril. ? A BPAP device. This device blows air through a mask when you breathe in (inhale) and breathe out.  Having surgery if other treatments do not work. It is important to get treatment for sleep apnea. Without treatment, it can lead to:  High blood pressure.  Coronary artery disease.  In men, not being able to have an erection (impotence).  Reduced thinking ability. Follow these instructions at home: Lifestyle  Make changes that your doctor recommends.  Eat a healthy diet.  Lose weight if needed.  Avoid alcohol, medicines to help you relax, and some pain medicines.  Do not use any products that contain nicotine or tobacco, such as cigarettes, e-cigarettes, and chewing tobacco. If you need help quitting, ask your doctor. General instructions  Take over-the-counter and prescription medicines only as told by your doctor.  If you were given a machine to use while you sleep, use it only as told by your doctor.  If you are having surgery, make sure to tell your doctor you have sleep apnea. You may need to bring your device with you.  Keep all follow-up visits as told by your doctor. This is important. Contact a doctor if:  The machine that you were given to use during sleep bothers you or does not seem to be working.  You do not get better.  You get worse. Get help right away if:  Your chest hurts.  You have trouble breathing in enough air.  You have an uncomfortable feeling in your back, arms, or stomach.  You have trouble talking.  One side of your body feels weak.  A part of your face is hanging down. These symptoms may be an  emergency. Do not wait to see if the symptoms will go away. Get medical help right away. Call your local emergency services (911 in the [place] Do not drive yourself to the hospital. Summary  This condition affects breathing during sleep.  The most common cause is a collapsed or blocked airway.  The goal of treatment is to help you breathe normally while you sleep. This information is not intended to replace advice given to you by your health care provider. Make sure you  discuss any questions you have with your health care provider. Document Revised: 08/20/2018 Document Reviewed: 06/29/2018 Elsevier Patient Education  2020 [employer].

## 2020-03-29 NOTE — Evaluation (Signed)
Physical Therapy Evaluation Patient Details Name: Jasmine Ramirez MRN: [id number] DOB: Jan 16, 1966 Today's Date: 03/29/2020   History of Present Illness  Pt is a 54 y/o female admitted secondary to increased lethargy, likely from recent medication change. PMH includes Down's Syndrome, seizures, and dementia.   Clinical Impression  Pt admitted secondary to problem above with deficits below. Pt requiring max A +2 for bed mobility and mod A +2 to stand at EOB X2 this session. Pt somewhat resistive at times, however, overall tolerated mobility well. Per pt's sister, plan is to return to group home and they are working to get her a higher level of care. Will continue to follow acutely to maximize functional mobility independence and safety.     Follow Up Recommendations Supervision/Assistance - 24 hour;Other (comment)(Return to group home with higher level of care)    Equipment Recommendations  None recommended by PT    Recommendations for Other Services       Precautions / Restrictions Precautions Precautions: Fall Restrictions Weight Bearing Restrictions: No      Mobility  Bed Mobility Overal bed mobility: Needs Assistance Bed Mobility: Rolling;Supine to Sit;Sit to Supine Rolling: Mod assist;Max assist;+2 for physical assistance   Supine to sit: Max assist;+2 for physical assistance Sit to supine: Max assist;+2 for physical assistance   General bed mobility comments: Mod A to roll to L when able to use RUE to assist. Max A to roll to R for clean up and bed linen change. Required max A +2 for LE and trunk assist to perform bed mobility tasks.   Transfers Overall transfer level: Needs assistance Equipment used: 2 person hand held assist Transfers: Sit to/from Stand Sit to Stand: Mod assist;+2 physical assistance         General transfer comment: Mod A +2 for lift assist to stand. Multimodal cues and manual assist to achieve full upright.   Ambulation/Gait                 Stairs            Wheelchair Mobility    Modified Rankin (Stroke Patients Only)       Balance Overall balance assessment: Needs assistance Sitting-balance support: No upper extremity supported;Feet supported Sitting balance-Leahy Scale: Poor Sitting balance - Comments: At times with posterior lean secondary to resistiveness. Otherwise requiring min guard for safety.                                      Pertinent Vitals/Pain Pain Assessment: Faces Faces Pain Scale: Hurts little more Pain Location: L shoulder with movement  Pain Descriptors / Indicators: Grimacing;Guarding Pain Intervention(s): Limited activity within patient's tolerance;Monitored during session;Repositioned    Home Living Family/patient expects to be discharged to:: Group home                      Prior Function Level of Independence: Needs assistance   Gait / Transfers Assistance Needed: Per sister, pt required assist to transfer to [facility].   ADL's / Homemaking Assistance Needed: Required assist for all ADLs.         Hand Dominance        Extremity/Trunk Assessment   Upper Extremity Assessment Upper Extremity Assessment: Defer to OT evaluation    Lower Extremity Assessment Lower Extremity Assessment: Generalized weakness    Cervical / Trunk Assessment Cervical / Trunk Assessment: Kyphotic  Communication  Communication: Expressive difficulties  Cognition Arousal/Alertness: Awake/alert Behavior During Therapy: WFL for tasks assessed/performed;Restless Overall Cognitive Status: History of cognitive impairments - at baseline                                 General Comments: Pt with Down's Syndrome and dementia at baseline. At times resistive to movement.       General Comments General comments (skin integrity, edema, etc.): Pt's sister present during session     Exercises     Assessment/Plan    PT Assessment Patient needs continued PT  services  PT Problem List Decreased strength;Decreased balance;Decreased mobility;Decreased cognition;Decreased knowledge of precautions;Decreased activity tolerance;Decreased safety awareness;Decreased knowledge of use of DME       PT Treatment Interventions DME instruction;Functional mobility training;Therapeutic activities;Therapeutic exercise;Balance training;Patient/family education    PT Goals (Current goals can be found in the Care Plan section)  Acute Rehab PT Goals Patient Stated Goal: return to group home per pt's sister PT Goal Formulation: With family Time For Goal Achievement: 04/12/20 Potential to Achieve Goals: Good    Frequency Min 3X/week   Barriers to discharge        Co-evaluation PT/OT/SLP Co-Evaluation/Treatment: Yes Reason for Co-Treatment: Complexity of the patient's impairments (multi-system involvement);To address functional/ADL transfers PT goals addressed during session: Mobility/safety with mobility;Balance         AM-PAC PT "6 Clicks" Mobility  Outcome Measure Help needed turning from your back to your side while in a flat bed without using bedrails?: A Lot Help needed moving from lying on your back to sitting on the side of a flat bed without using bedrails?: Total Help needed moving to and from a bed to a chair (including a wheelchair)?: A Lot Help needed standing up from a chair using your arms (e.g., wheelchair or bedside chair)?: A Lot Help needed to walk in hospital room?: Total Help needed climbing 3-5 steps with a railing? : Total 6 Click Score: 9    End of Session Equipment Utilized During Treatment: Gait belt Activity Tolerance: Patient tolerated treatment well Patient left: in bed;with call bell/phone within reach;with bed alarm set Nurse Communication: Mobility status PT Visit Diagnosis: Other abnormalities of gait and mobility (R26.89);Muscle weakness (generalized) (M62.81);Difficulty in walking, not elsewhere classified (R26.2)     Time: 1093-2355 PT Time Calculation (min) (ACUTE ONLY): 34 min   Charges:   PT Evaluation $PT Eval Moderate Complexity: 1 Mod          Reuel Derby, PT, DPT  Acute Rehabilitation Services  Pager: [phone] Office: [phone]   Rudean Hitt 03/29/2020, 5:44 PM

## 2020-03-29 NOTE — Evaluation (Signed)
Occupational Therapy Evaluation Patient Details Name: Jasmine Ramirez MRN: [id number] DOB: [birth year] Today's Date: 03/29/2020    History of Present Illness Pt is a 54 y/o female admitted secondary to increased lethargy, likely from recent medication change. PMH includes Down's Syndrome, seizures, and dementia.    Clinical Impression   PTA pt living at group home, total care for BADLs. At time of eval, sister present to give information and history. Pt completing bed mobility with max A +2 and sit <> stand with mod A +2. Pt incontinent of bowels and required total A for peri care. Attempted to engage pt in brushing hair- sister states she has assist for everything at group home. Pt is at baseline BADL function and does not require further services, recommend she return to group home with higher level of care that sister states they can provide. OT Will sign off, thank you for this consult.     Follow Up Recommendations  Other (comment)(Return to group home with higher level of care)    Equipment Recommendations  None recommended by OT    Recommendations for Other Services       Precautions / Restrictions Precautions Precautions: Fall Restrictions Weight Bearing Restrictions: No      Mobility Bed Mobility Overal bed mobility: Needs Assistance Bed Mobility: Rolling;Supine to Sit;Sit to Supine Rolling: Mod assist;Max assist;+2 for physical assistance   Supine to sit: Max assist;+2 for physical assistance Sit to supine: Max assist;+2 for physical assistance   General bed mobility comments: Mod A to roll to L when able to use RUE to assist. Max A to roll to R for clean up and bed linen change. Required max A +2 for LE and trunk assist to perform bed mobility tasks.   Transfers Overall transfer level: Needs assistance Equipment used: 2 person hand held assist Transfers: Sit to/from Stand Sit to Stand: Mod assist;+2 physical assistance         General transfer comment:  Mod A +2 for lift assist to stand. Multimodal cues and manual assist to achieve full upright.     Balance Overall balance assessment: Needs assistance Sitting-balance support: No upper extremity supported;Feet supported Sitting balance-Leahy Scale: Poor Sitting balance - Comments: At times with posterior lean secondary to resistiveness. Otherwise requiring min guard for safety.                                    ADL either performed or assessed with clinical judgement   ADL                                         General ADL Comments: Pt is total care for BADL at baseline, as well as at time of eval. Attempted to initiate pt in grooming tasks- pt states that staff take care of everything for her     Vision Patient Visual Report: No change from baseline Additional Comments: periorbital bruising     Perception     Praxis      Pertinent Vitals/Pain Pain Assessment: Faces Faces Pain Scale: Hurts little more Pain Location: L shoulder with movement  Pain Descriptors / Indicators: Grimacing;Guarding Pain Intervention(s): Limited activity within patient's tolerance;Monitored during session;Repositioned     Hand Dominance     Extremity/Trunk Assessment Upper Extremity Assessment Upper Extremity Assessment: Generalized weakness;LUE deficits/detail LUE Deficits /  Details: per sister shoulder is dislocated and has been for some time. Pt guards and rarely uses extremity   Lower Extremity Assessment Lower Extremity Assessment: Generalized weakness   Cervical / Trunk Assessment Cervical / Trunk Assessment: Kyphotic   Communication Communication Communication: Expressive difficulties   Cognition Arousal/Alertness: Awake/alert Behavior During Therapy: WFL for tasks assessed/performed;Restless Overall Cognitive Status: History of cognitive impairments - at baseline                                 General Comments: Pt with Down's  Syndrome and dementia at baseline. At times resistive to movement and intermittently followed commands. Can say "ouch" and yes/no   General Comments  Pt's sister present during session     Exercises     Shoulder Instructions      Home Living Family/patient expects to be discharged to:: Group home                                        Prior Functioning/Environment Level of Independence: Needs assistance  Gait / Transfers Assistance Needed: Per sister, pt required assist to transfer to [facility].  ADL's / Homemaking Assistance Needed: Required assist for all ADLs.             OT Problem List: Decreased strength;Decreased knowledge of use of DME or AE;Decreased activity tolerance;Decreased cognition;Impaired balance (sitting and/or standing);Pain      OT Treatment/Interventions:      OT Goals(Current goals can be found in the care plan section) Acute Rehab OT Goals Patient Stated Goal: return to group home per pt's sister OT Goal Formulation: All assessment and education complete, DC therapy  OT Frequency:     Barriers to D/C:            Co-evaluation PT/OT/SLP Co-Evaluation/Treatment: Yes Reason for Co-Treatment: Necessary to address cognition/behavior during functional activity;For patient/therapist safety PT goals addressed during session: Mobility/safety with mobility;Balance OT goals addressed during session: ADL's and self-care;Strengthening/ROM      AM-PAC OT "6 Clicks" Daily Activity     Outcome Measure Help from another person eating meals?: Total Help from another person taking care of personal grooming?: Total Help from another person toileting, which includes using toliet, bedpan, or urinal?: Total Help from another person bathing (including washing, rinsing, drying)?: Total Help from another person to put on and taking off regular upper body clothing?: Total Help from another person to put on and taking off regular lower body clothing?:  Total 6 Click Score: 6   End of Session Equipment Utilized During Treatment: Gait belt Nurse Communication: Mobility status  Activity Tolerance: Patient tolerated treatment well Patient left: in bed;with call bell/phone within reach;with family/visitor present;with bed alarm set  OT Visit Diagnosis: Other abnormalities of gait and mobility (R26.89);Unsteadiness on feet (R26.81);Other symptoms and signs involving cognitive function                Time: 2952-8413 OT Time Calculation (min): 34 min Charges:  OT General Charges $OT Visit: 1 Visit OT Evaluation $OT Eval Moderate Complexity: 1 Mod  Dalphine Handing, MSOT, OTR/L Acute Rehabilitation Services [facility] Office Number: [phone] Pager: [phone]  Dalphine Handing 03/29/2020, 6:06 PM

## 2020-03-29 NOTE — TOC Progression Note (Addendum)
Transition of Care (TOC) - Progression Note    Patient Details  Name: Jasmine Ramirez MRN: [id number] Date of Birth: 02/18/1966  Transition of Care (TOC) CM/SW Contact   J Riddick, LCSWA Phone Number: 03/29/2020, 10:40 AM  Clinical Narrative:    CSW met bedside with patient who was sleeping and patient's sister Nicole. CSW confirmed patient will be returning to [facility] and sister Nicole will provide transportation. Sister requested resources for one-on-one caregiver in the community. CSW provided agency resources to patient's sister.  CSW contacted [facility] to provide an update on discharge. [facility] requested discharge summary of any medication changes prior to discharge. CSW agreed to fax information [phone].       Expected Discharge Plan and Services           Expected Discharge Date: 03/29/20                                     Social Determinants of Health (SDOH) Interventions    Readmission Risk Interventions No flowsheet data found.  

## 2020-03-29 NOTE — Progress Notes (Signed)
Family Medicine Teaching Service Daily Progress Note Intern Pager: [phone]  Patient name: Jasmine Ramirez Medical record number: [id number] Date of birth: 01-Sep-1966 Age: 54 y.o. Gender: female  Primary Care Provider: Trinidad Curet Consultants: Neuro Code Status: Full  Pt Overview and Major Events to Date:  5/10 admitted, hypoxic event  Assessment and Plan: Jasmine Ramirez is a 54 y.o. female presenting with drowsiness. PMH is significant for Down Syndrome, hypothyroidism, seizures, early onset alzheimer's, heart murmur (VSD).   Increased Drowsiness- resolved Patient at her neurologic baseline, vitals now stable. Group home saying today they cannot take her back as she needs a higher level of care to deal with advancing dementia as patient has declined in the last year and now needs a wheel chair. None of this has been a change since admission, and the group home was caring for the patient in this condition, the claim that they cannot take care of her in this condition is new and a change since yesterday. Discharge canceled. Will consult PT/OT for imminent discharge to assess for SNF vs ALF. SW updated and onboard to assist. Patient presented for concern of increased lethargy, drowsiness over the weekend. She also had fallen out of bed on 5/8 and sustained two black eyes, CT head negative for acute process other than periorbital swelling. Patient recently had medication change to klonopin 0.5 mg daily from lorazepam 1mg  daily for agitation/violent behavior related to increasing dementia (benzodiazepines initiated in November, 2020). Patient is more sensitive to klonopin than lorazepam, and benzodiazepines are not preferred in geriatric or dementia patients due to increased risk for delirium, sedation, falls, etc. Will start patient on a 1 week taper of 0.25mg  klonopin, and recommend that instead of benzodiazepines, second generation antipsychotics such as quetiapine be used for  agitation or sundowning with dementia. -continuous pulse ox and cardiac monitoring - taper klonopin 0.25mg  daily x1 week and then cease - use 2nd generation antipsychotics for agitation or sundowning such as quetiapine -Vital signs per floor protocol -Tylenol as needed -PT/OT  OSA  Hypoxic Events Patient has returned to her neurologic baseline of answering yes and no, and saying "ouch." She is alert, and not oriented (baseline). VSS while awake, see above. Patient notably has quite loud snoring, and when sleeping supine and flat can have apneic events where she desaturates to the 30s, and her MAP and HR can similarly drop. When patient is turned on her side, these events do not occur. Patient was examined after one such apneic event yesterday by CCM, who attribute the cause of the event to OSA. Attempted to use CPAP last night, but patient cannot tolerate it. She needed a mitten to prevent from removing leads and O2 tubing. Similarly, patient had a similar apneic event this morning for 30 seconds that resolved when the patient awoke and turned. Recommend patient sleep with O2 via [place] and at an inclined angle, and/or propped on her side, to prevent apneic events. Plan to touch base with CCM/Pulmonology to see if they have any other recommendations. -sleep with O2 via [place] -sleep at an angle, or on side -continuous pulse oximetry and cardiac monitoring  Early onset Alzheimer's  Down Syndrome  Patient with a history of early onset Alzheimer's and Down syndrome.  Patient currently lives in a group home.  Home medications include Namenda and Aricept. Minimally verbal at baseline, answers yes or no questions, can say ouch. At baseline today, for more information see above in "increased drowsiness." Recommend that family follow with palliative  care outpatient to assess goals of care. - continue home Aricept -Continue Namenda 7 mg  Hypothyroidism  Home medications include Synthroid 75 mcg -Continue  home Synthroid -TSH within normal limits on admission  Hx of Seizures Patient on Keppra 500 mg twice daily.  No recent report of seizure activity. -Continue Keppra 500 mg twice daily  Hyperlipidemia Home medication includes simvastatin 20 mg daily. -Continue simvastatin  FEN/GI: soft diet PPx: lovenox  Disposition: home today  Subjective:  Patient is alert and active this morning. She is at reported baseline of saying "yes" and "no." No longer as drowsy. No apneic events yesterday.   Objective: Temp:  [97.5 F (36.4 C)-98.5 F (36.9 C)] 98 F (36.7 C) (05/13 0328) Pulse Rate:  [34-52] 51 (05/12 1938) Resp:  [10-17] 14 (05/12 1938) BP: (85-109)/(37-64) 109/64 (05/13 0328) SpO2:  [99 %-100 %] 100 % (05/12 1938) Physical Exam: General: alert, older woman with DS, resting comfortably in bed, NAD Cardiovascular: RRR, no m/r/g Respiratory: CTAB, some stertorous upper airway sounds while supine Abdomen: soft, NT, ND Extremities: moving all 4 limbs, limited LUE movement due to chronically dislocated shoulder, warm and dry  Laboratory: Recent Labs  Lab 03/26/20 0404 03/26/20 0404 03/26/20 0625 03/27/20 0309 03/28/20 0733  WBC 9.6  --   --  6.4 5.4  HGB 12.1   < > 12.6 12.2 12.2  HCT 36.3   < > 37.0 36.5 36.8  PLT 161  --   --  167 164   < > = values in this interval not displayed.   Recent Labs  Lab 03/25/20 2129 03/25/20 2129 03/26/20 0404 03/26/20 0404 03/26/20 0625 03/27/20 0309 03/28/20 0733  NA 134*   < > 135   < > 136 138 137  K 5.4*   < > 3.9   < > 3.9 4.3 4.2  CL 103   < > 105  --   --  106 106  CO2 24   < > 25  --   --  24 26  BUN 13   < > 9  --   --  8 12  CREATININE 0.91   < > 0.79  --   --  0.75 0.83  CALCIUM 8.2*   < > 7.9*  --   --  7.7* 8.2*  PROT 5.9*  --  5.1*  --   --  5.3*  --   BILITOT 0.9  --  0.5  --   --  0.9  --   ALKPHOS 73  --  56  --   --  54  --   ALT 14  --  13  --   --  12  --   AST 29  --  16  --   --  18  --   GLUCOSE 85    < > 114*  --   --  83 110*   < > = values in this interval not displayed.   Imaging/Diagnostic Tests: EEG  Result Date: 03/26/2020 Jasmine Quest, MD     03/26/2020  5:18 PM Patient Name: Jasmine Ramirez MRN: [id number] Epilepsy Attending: Charlsie Ramirez Referring Physician/Provider: Dr. Towanda Octave Date: 03/26/2020 Duration: 23.55 mins Patient history: 31 old female with history of Down syndrome, seizures, early onset Alzheimer's presented with increased drowsiness.  EEG evaluate for seizures. Level of alertness: Awake AEDs during EEG study: Keppra Technical aspects: This EEG study was done with scalp electrodes positioned according to the 10-20 International  system of electrode placement. Electrical activity was acquired at a sampling rate of 500Hz  and reviewed with a high frequency filter of 70Hz  and a low frequency filter of 1Hz . EEG data were recorded continuously and digitally stored. Description: No clear posterior dominant rhythm was seen. EEG showed continuous generalized 3-6Hz  theta-delta slowing.  Hyperventilation and photic stimulation were not performed. Of note, eeg was technically difficult due to significant movement artifact.  Abnormality -Continuous slow, generalized  IMPRESSION: This technically difficult study is suggestive of moderate diffuse encephalopathy, nonspecific etiology but likely secondary to patient's baseline static encephalopathy. No seizures or epileptiform discharges were seen throughout the recording.    CT HEAD WO CONTRAST  Result Date: 03/26/2020 CLINICAL DATA:  54 year old female with continued altered/poor mental status after a fall. Query subdural hematoma. EXAM: CT HEAD WITHOUT CONTRAST TECHNIQUE: Contiguous axial images were obtained from the base of the skull through the vertex without intravenous contrast. COMPARISON:  Head CT 03/24/2020, and earlier.  Brain MRI 06/11/2018. FINDINGS: Brain: Chronic cerebral volume loss and dystrophic  calcifications in the basal ganglia and left cerebellum. Cerebral volume and ventricle size have not significantly changed since 2019. No transependymal edema. No midline shift, ventriculomegaly, mass effect, evidence of mass lesion, intracranial hemorrhage or evidence of cortically based acute infarction. Stable gray-white matter differentiation throughout the brain. No definite cortical encephalomalacia. Vascular: Calcified atherosclerosis at the skull base. Skull: Stable and intact. Sinuses/Orbits: Visualized paranasal sinuses are stable and well pneumatized. Chronic mastoid opacity/sclerosis is stable. Left tympanic cavity opacification has increased since 2019. Other: No acute orbit or scalp soft tissue finding. IMPRESSION: 1. No acute intracranial abnormality or recent traumatic injury identified. 2. Stable non contrast CT appearance of the brain since 2019 with age advanced cerebral volume loss and probably ex vacuo ventricular enlargement. Electronically Signed   By: 2020 M.D.   On: 03/26/2020 12:11   DG Chest Port 1 View  Result Date: 03/25/2020 CLINICAL DATA:  54 year old female with hypothermia. EXAM: PORTABLE CHEST 1 VIEW COMPARISON:  Chest radiograph dated 03/07/2020. FINDINGS: Minimal bilateral mid to lower lung field interstitial densities may represent atelectatic changes. Atypical infection is less likely. Clinical correlation is recommended. No focal consolidation, pleural effusion or pneumothorax. Stable cardiac silhouette. No acute osseous pathology. Sclerotic lesion in the proximal left humeral diaphysis suboptimally characterized but may represent a chondroid lesion such as an enchondroma. This can be better characterized by MRI on a nonemergent/outpatient basis. IMPRESSION: 1. No focal consolidation. 2. Minimal bilateral mid to lower lung field interstitial densities may represent atelectatic changes. Electronically Signed   By: 05/25/2020 M.D.   On: 03/25/2020 21:32   03/09/2020, MD 03/29/2020, 6:55 AM PGY-1, [facility] Family Medicine FPTS Intern pager: [phone], text pages welcome

## 2020-03-30 LAB — CULTURE, BLOOD (ROUTINE X 2)
Culture: NO GROWTH
Culture: NO GROWTH

## 2020-03-30 NOTE — Progress Notes (Signed)
Physical Therapy Treatment Patient Details Name: Jasmine Ramirez MRN: [id number] DOB: November 04, 1966 Today's Date: 03/30/2020    History of Present Illness Pt is a 54 y/o female admitted secondary to increased lethargy, likely from recent medication change. PMH includes Down's Syndrome, seizures, and dementia.     PT Comments    Patient seen for PT treatment and discharge planning. Pt is able to transfer OOB this session with max A +2. pt did not attempt to verbalize and did not initiate mobility with cues. Noted that group home cannot provide level of care needed at this time and therefore recommend SNF for further skilled PT services to decreased of falls and caregiver burden.     Follow Up Recommendations  Supervision/Assistance - 24 hour;SNF     Equipment Recommendations  None recommended by PT    Recommendations for Other Services       Precautions / Restrictions Precautions Precautions: Fall Restrictions Weight Bearing Restrictions: No    Mobility  Bed Mobility Overal bed mobility: Needs Assistance Bed Mobility: Supine to Sit     Supine to sit: HOB elevated;Max assist;+2 for safety/equipment     General bed mobility comments: HOB elevated; use of bed pad to bring hips to EOB; pt assisted minimally with bringing trunk into sitting   Transfers Overall transfer level: Needs assistance Equipment used: 2 person hand held assist Transfers: Sit to/from Stand;Stand Pivot Transfers Sit to Stand: +2 physical assistance;Max assist Stand pivot transfers: Max assist;+2 physical assistance       General transfer comment: assist to power up into standing with bed pad utilized for hip extension; assist to maintain balance and weight shift for pivot to recliner   Ambulation/Gait                 Stairs             Wheelchair Mobility    Modified Rankin (Stroke Patients Only)       Balance Overall balance assessment: Needs assistance Sitting-balance  support: No upper extremity supported;Feet supported Sitting balance-Leahy Scale: Fair                                      Cognition Arousal/Alertness: Awake/alert Behavior During Therapy: WFL for tasks assessed/performed;Restless Overall Cognitive Status: History of cognitive impairments - at baseline                                 General Comments: Pt with Down's Syndrome and dementia at baseline. At times resistive to movement and intermittently followed commands. pt did not attempt to verbalize during session      Exercises      General Comments        Pertinent Vitals/Pain Pain Assessment: Faces Faces Pain Scale: Hurts little more Pain Location: L UE with movement  Pain Descriptors / Indicators: Grimacing;Guarding Pain Intervention(s): Limited activity within patient's tolerance;Repositioned;Monitored during session    Home Living                      Prior Function            PT Goals (current goals can now be found in the care plan section) Progress towards PT goals: Progressing toward goals    Frequency    Min 3X/week      PT Plan Current plan remains appropriate  Co-evaluation              AM-PAC PT "6 Clicks" Mobility   Outcome Measure  Help needed turning from your back to your side while in a flat bed without using bedrails?: A Lot Help needed moving from lying on your back to sitting on the side of a flat bed without using bedrails?: A Lot Help needed moving to and from a bed to a chair (including a wheelchair)?: A Lot Help needed standing up from a chair using your arms (e.g., wheelchair or bedside chair)?: A Lot Help needed to walk in hospital room?: Total Help needed climbing 3-5 steps with a railing? : Total 6 Click Score: 10    End of Session Equipment Utilized During Treatment: Gait belt Activity Tolerance: Patient tolerated treatment well Patient left: with call bell/phone within  reach;in chair;with chair alarm set Nurse Communication: Mobility status PT Visit Diagnosis: Other abnormalities of gait and mobility (R26.89);Muscle weakness (generalized) (M62.81);Difficulty in walking, not elsewhere classified (R26.2)     Time: 6213-0865 PT Time Calculation (min) (ACUTE ONLY): 26 min  Charges:  $Therapeutic Activity: 23-37 mins                     Earney Navy, PTA Acute Rehabilitation Services Pager: [phone] Office: [phone]     Darliss Cheney 03/30/2020, 4:17 PM

## 2020-03-30 NOTE — Progress Notes (Signed)
Called over to acute physical therapy office.  Patient was not scheduled to be seen until Monday given her stability.  However, with her current situation, we are requesting new recommendations as patient will not be returning to her group home. We will follow-up and involve CSW.   Melene Plan, M.D.  2:57 PM 03/30/2020

## 2020-03-30 NOTE — TOC Progression Note (Addendum)
Transition of Care [facility]) - Progression Note    Patient Details  Name: Jasmine Ramirez MRN: [id number] Date of Birth: [birth year]  Transition of Care [facility]) CM/SW Contact  Nonda Lou, [place] Phone Number: 03/30/2020, 11:52 AM  Clinical Narrative:    Update: CSW spoke with Bobbye Riggs from [facility] group home. Alexis expressed based on the clinical information provided, patient needs a higher level of care than they are able to provide.  CSW contacted Dr. Allena Katz and provided an update. CSW also spoke with patient's sister, informing her that we will continue to assist with discharge planning needs.  CSW spoke with patient's sister Nichole. Sister expressed the group home is able to take patient back, pending the discharge summary being updated. Group home will also provide transportation. CSW contacted MD and will continue to follow to assist with discharge planning needs.      Expected Discharge Plan and Services           Expected Discharge Date: 03/29/20                                     Social Determinants of Health (SDOH) Interventions    Readmission Risk Interventions No flowsheet data found.

## 2020-03-30 NOTE — Progress Notes (Addendum)
Dr. Allena Katz spoke to social worker over the phone.  At this time, group home reports that they cannot take patient back at any point as she still requires higher level of care than they can offer, despite her being back at her baseline.  [facility] consult PT/OT for further discharge recommendations and work with LCSW for other d/c options  Melene Plan, M.D.  2:20 PM 03/30/2020

## 2020-03-31 MED ORDER — QUETIAPINE FUMARATE 25 MG PO TABS: 25.0000 mg | ORAL_TABLET | Freq: Every evening | ORAL | Status: DC | PRN

## 2020-03-31 MED ORDER — CLONAZEPAM 0.125 MG PO TBDP
0.1250 mg | ORAL_TABLET | Freq: Every day | ORAL | Status: AC
  Administered 2020-04-01: 0.125 mg via ORAL
  Filled 2020-03-31: qty 1

## 2020-03-31 NOTE — Progress Notes (Signed)
Family Medicine Teaching Service Daily Progress Note Intern Pager: [phone]  Patient name: Jasmine Ramirez Medical record number: [id number] Date of birth: October 16, 1966 Age: 54 y.o. Gender: female  Primary Care Provider: Lucretia Field Consultants: Neuro Code Status: Full  Pt Overview and Major Events to Date:  5/10 admitted, hypoxic event  Assessment and Plan: [name] is a 54 y.o. female presenting with drowsiness. PMH is significant for Down Syndrome, hypothyroidism, seizures, early onset alzheimer's, heart murmur (VSD).   Increased Drowsiness- resolved Patient at her neurologic baselin, VSS. Currently on klonopin taper to end 5/17, and recommend that instead of benzodiazepines, second generation antipsychotics such as quetiapine be used for agitation or sundowning with dementia. So far patient has not exhibited sundowning, agitation, or violent behaviors while in the hospital.  - taper klonopin 0.25mg  daily x1 week (end 5/17) - use 2nd generation antipsychotics for agitation or sundowning such as quetiapine -Vital signs per floor protocol -Tylenol as needed -PT/OT -SW to assist with placement as group home indicated they would take patient back throughout this admission and ultimately denied acceptance today  OSA  Hypoxic Events Patient has returned to her neurologic baseline of answering yes and no, and saying "ouch." She is alert, and not oriented (baseline). VSS while awake. Doing well. -sleep at an angle, or on side  Early onset Alzheimer's  Down Syndrome  Patient with a history of early onset Alzheimer's and Down syndrome.  Patient currently lives in a group home.  Home medications include Namenda and Aricept. Minimally verbal at baseline, answers yes or no questions, can say ouch. Recommend that family follow with palliative care outpatient to assess goals of care. - continue home Aricept -Continue Namenda 7 mg  Hypothyroidism  Home medications include  Synthroid 75 mcg -Continue home Synthroid -TSH within normal limits on admission  Hx of Seizures Patient on Keppra 500 mg twice daily.  No recent report of seizure activity. -Continue Keppra 500 mg twice daily  Hyperlipidemia Home medication includes simvastatin 20 mg daily. -Continue simvastatin  FEN/GI: soft diet PPx: lovenox  Disposition: awaiting SNF/ALF placement  Subjective:  Patient is alert and active this morning. She is at reported baseline of saying "yes" and "no." No longer as drowsy. No apneic events.  Objective: Temp:  [97.6 F (36.4 C)-98.5 F (36.9 C)] 98 F (36.7 C) (05/15 0000) Pulse Rate:  [49-64] 61 (05/15 0000) Resp:  [16-20] 20 (05/15 0000) BP: (109-116)/(51-82) 109/71 (05/15 0000) SpO2:  [96 %-100 %] 98 % (05/15 0000) Physical Exam: General: alert, older woman with DS, resting comfortably in bed, NAD Cardiovascular: RRR, no m/r/g Respiratory: CTAB, some stertorous upper airway sounds while supine Abdomen: soft, NT, ND Extremities: moving all 4 limbs, limited LUE movement due to chronically dislocated shoulder, warm and dry  Laboratory: Recent Labs  Lab 03/26/20 0404 03/26/20 0404 03/26/20 0625 03/27/20 0309 03/28/20 0733  WBC 9.6  --   --  6.4 5.4  HGB 12.1   < > 12.6 12.2 12.2  HCT 36.3   < > 37.0 36.5 36.8  PLT 161  --   --  167 164   < > = values in this interval not displayed.   Recent Labs  Lab 03/25/20 2129 03/25/20 2129 03/26/20 0404 03/26/20 0404 03/26/20 0625 03/27/20 0309 03/28/20 0733  NA 134*   < > 135   < > 136 138 137  K 5.4*   < > 3.9   < > 3.9 4.3 4.2  CL 103   < >  105  --   --  106 106  CO2 24   < > 25  --   --  24 26  BUN 13   < > 9  --   --  8 12  CREATININE 0.91   < > 0.79  --   --  0.75 0.83  CALCIUM 8.2*   < > 7.9*  --   --  7.7* 8.2*  PROT 5.9*  --  5.1*  --   --  5.3*  --   BILITOT 0.9  --  0.5  --   --  0.9  --   ALKPHOS 73  --  56  --   --  54  --   ALT 14  --  13  --   --  12  --   AST 29  --  16   --   --  18  --   GLUCOSE 85   < > 114*  --   --  83 110*   < > = values in this interval not displayed.   Imaging/Diagnostic Tests: No results found. Gladys Damme, MD 03/31/2020, 8:12 AM PGY-1, [place] Intern pager: [phone], text pages welcome

## 2020-03-31 NOTE — Progress Notes (Signed)
Family Medicine Teaching Service Daily Progress Note Intern Pager: [phone]  Patient name: Jasmine Ramirez Medical record number: [id number] Date of birth: 19-Feb-1966 Age: 54 y.o. Gender: female  Primary Care Provider: Lucretia Field Consultants: Neuro Code Status: Full  Pt Overview and Major Events to Date:  5/10 admitted, hypoxic event  Assessment and Plan: Jasmine Ramirez is a 54 y.o. female presenting with drowsiness. PMH is significant for Down Syndrome, hypothyroidism, seizures, early onset alzheimer's, heart murmur (VSD).   Increased Drowsiness- resolved Patient at her neurologic baselin, VSS. Currently on klonopin 0.25mg  taper. So far patient has not exhibited sundowning, agitation, or violent behaviors while in the hospital. Consequently, we will decrease klonopin tomorrow to 0.125 mg and make low dose quetiapine available for agitation/sundowning, as a second generation antipsychotic is preferred in patients with dementia. - taper klonopin to 0.125mg   - quetiapine 25mg  qhs prn to start 5/16 -Vital signs per floor protocol -Tylenol as needed -PT/OT -SW to assist with placement as group home indicated they would take patient back throughout this admission and ultimately denied acceptance today  OSA  Hypoxic Events Patient has returned to her neurologic baseline of answering yes and no, and saying "ouch." She is alert, and not oriented (baseline). VSS while awake. Doing well. -sleep at an angle, or on side  Early onset Alzheimer's  Down Syndrome  Patient with a history of early onset Alzheimer's and Down syndrome.  Patient currently lives in a group home.  Home medications include Namenda and Aricept. Minimally verbal at baseline, answers yes or no questions, can say ouch. Recommend that family follow with palliative care outpatient to assess goals of care. - continue home Aricept -Continue Namenda 7 mg  Hypothyroidism  Home medications include Synthroid 75  mcg -Continue home Synthroid -TSH within normal limits on admission  Hx of Seizures Patient on Keppra 500 mg twice daily.  No recent report of seizure activity. -Continue Keppra 500 mg twice daily  Hyperlipidemia Home medication includes simvastatin 20 mg daily. -Continue simvastatin  FEN/GI: soft diet PPx: lovenox  Disposition: awaiting SNF/ALF placement  Subjective:  Patient alert, at baseline, sitting up in chair, watching cartoons.  Objective: Temp:  [97.6 F (36.4 C)-98.5 F (36.9 C)] 98 F (36.7 C) (05/15 0000) Pulse Rate:  [49-64] 61 (05/15 0000) Resp:  [16-20] 20 (05/15 0000) BP: (109-116)/(51-82) 109/71 (05/15 0000) SpO2:  [96 %-100 %] 98 % (05/15 0000) Physical Exam: General: alert, older woman with DS, resting comfortably in chair, NAD Cardiovascular: RRR, no m/r/g Respiratory: CTAB, some stertorous upper airway sounds while supine Abdomen: soft, NT, ND Extremities: moving all 4 limbs, limited LUE movement due to chronically dislocated shoulder, warm and dry  Laboratory: Recent Labs  Lab 03/26/20 0404 03/26/20 0404 03/26/20 0625 03/27/20 0309 03/28/20 0733  WBC 9.6  --   --  6.4 5.4  HGB 12.1   < > 12.6 12.2 12.2  HCT 36.3   < > 37.0 36.5 36.8  PLT 161  --   --  167 164   < > = values in this interval not displayed.   Recent Labs  Lab 03/25/20 2129 03/25/20 2129 03/26/20 0404 03/26/20 0404 03/26/20 0625 03/27/20 0309 03/28/20 0733  NA 134*   < > 135   < > 136 138 137  K 5.4*   < > 3.9   < > 3.9 4.3 4.2  CL 103   < > 105  --   --  106 106  CO2 24   < >  25  --   --  24 26  BUN 13   < > 9  --   --  8 12  CREATININE 0.91   < > 0.79  --   --  0.75 0.83  CALCIUM 8.2*   < > 7.9*  --   --  7.7* 8.2*  PROT 5.9*  --  5.1*  --   --  5.3*  --   BILITOT 0.9  --  0.5  --   --  0.9  --   ALKPHOS 73  --  56  --   --  54  --   ALT 14  --  13  --   --  12  --   AST 29  --  16  --   --  18  --   GLUCOSE 85   < > 114*  --   --  83 110*   < > = values  in this interval not displayed.   Imaging/Diagnostic Tests: No results found. Gladys Damme, MD 03/31/2020, 8:24 AM PGY-1, [place] Intern pager: [phone], text pages welcome

## 2020-04-01 NOTE — Progress Notes (Signed)
Family Medicine Teaching Service Daily Progress Note Intern Pager: [phone]  Patient name: Jasmine Ramirez Medical record number: [id number] Date of birth: 1966/05/29 Age: 54 y.o. Gender: female  Primary Care Provider: Lucretia Field Consultants: Neuro Code Status: Full  Pt Overview and Major Events to Date:  5/10 admitted, hypoxic event  Assessment and Plan: Jasmine Ramirez is a 54 y.o. female presenting with drowsiness. PMH is significant for Down Syndrome, hypothyroidism, seizures, early onset alzheimer's, heart murmur (VSD).    Increased Drowsiness- resolved Patient at her neurologic baselin, VSS. Currently on klonopin 0.25mg  taper. So far patient has not exhibited sundowning, agitation, or violent behaviors while in the hospital. Continue to decrease klonopin to now 0.125 mg and make low dose quetiapine available for agitation/sundowning, as a second generation antipsychotic is preferred in patients with dementia. -monitor with taper of klonopin to 0.125mg   -quetiapine 25mg  qhs prn to start 5/16 -Tylenol as needed -PT/OT -SW to assist with placement   OSA  Hypoxic Events Patient has returned to her neurologic baseline of answering yes and no, and saying "ouch." She is alert, and not oriented (baseline). VSS while awake. Doing well. -sleep at an angle, or on side   Early onset Alzheimer's  Down Syndrome  Patient with a history of early onset Alzheimer's and Down syndrome.  Patient currently lives in a group home.  Home medications include Namenda and Aricept. Minimally verbal at baseline, answers yes or no questions, can say ouch. Recommend that family follow with palliative care outpatient to assess goals of care. -Continue home Aricept -Continue Namenda 7 mg   Hypothyroidism  Home medications include Synthroid 75 mcg -Continue home Synthroid   Hx of Seizures Patient on Keppra 500 mg twice daily.  No recent report of seizure activity. -Continue Keppra 500 mg  twice daily   Hyperlipidemia Home medication includes simvastatin 20 mg daily. -Continue simvastatin  FEN/GI: soft diet PPx: lovenox  Disposition: awaiting SNF/ALF placement  Subjective:  Patient resting comfortably in room  Objective: Temp:  [97.3 F (36.3 C)-98.4 F (36.9 C)] 97.8 F (36.6 C) (05/16 0446) Pulse Rate:  [51-80] 52 (05/16 0446) Resp:  [14-18] 18 (05/16 0446) BP: (93-115)/(45-81) 103/48 (05/16 0446) SpO2:  [92 %-100 %] 98 % (05/16 0446) Physical Exam: General: NAD, pleasant, sleeping comfortably Cardiovascular: RRR, no m/r/g, no LE edema Respiratory: CTA BL, normal work of breathing Derm: no rashes appreciated  Laboratory: Recent Labs  Lab 03/26/20 0404 03/26/20 0404 03/26/20 0625 03/27/20 0309 03/28/20 0733  WBC 9.6  --   --  6.4 5.4  HGB 12.1   < > 12.6 12.2 12.2  HCT 36.3   < > 37.0 36.5 36.8  PLT 161  --   --  167 164   < > = values in this interval not displayed.   Recent Labs  Lab 03/25/20 2129 03/25/20 2129 03/26/20 0404 03/26/20 0404 03/26/20 0625 03/27/20 0309 03/28/20 0733  NA 134*   < > 135   < > 136 138 137  K 5.4*   < > 3.9   < > 3.9 4.3 4.2  CL 103   < > 105  --   --  106 106  CO2 24   < > 25  --   --  24 26  BUN 13   < > 9  --   --  8 12  CREATININE 0.91   < > 0.79  --   --  0.75 0.83  CALCIUM 8.2*   < >  7.9*  --   --  7.7* 8.2*  PROT 5.9*  --  5.1*  --   --  5.3*  --   BILITOT 0.9  --  0.5  --   --  0.9  --   ALKPHOS 73  --  56  --   --  54  --   ALT 14  --  13  --   --  12  --   AST 29  --  16  --   --  18  --   GLUCOSE 85   < > 114*  --   --  83 110*   < > = values in this interval not displayed.   Imaging/Diagnostic Tests: No results found. Deziyah Arvin, [place], DO 04/01/2020, 6:19 AM PGY-3, [place] Intern pager: [phone], text pages welcome

## 2020-04-01 NOTE — NC FL2 (Addendum)
[place] MEDICAID FL2 LEVEL OF CARE SCREENING TOOL     IDENTIFICATION  Patient Name: Jasmine Ramirez Birthdate: Nov 25, 1965 Sex: female Admission Date (Current Location): 03/25/2020  [facility] and [place] Number:  Producer, television/film/video and Address:  [facility] [place], [address], [place], [place] [address]      Provider Number: [id number]  Attending Physician Name and Address:  Nestor Ramp, MD  Relative Name and Phone Number:  Lorelle Formosa    Current Level of Care: Hospital Recommended Level of Care: Skilled Nursing Facility Prior Approval Number:    Date Approved/Denied:   PASRR Number: Pending  Discharge Plan: SNF    Current Diagnoses: Patient Active Problem List   Diagnosis Date Noted  . Generalized weakness   . AMS (altered mental status) 03/26/2020  . Altered mental status 03/26/2020  . Hypoxia   . OSA (obstructive sleep apnea)   . Seizure (HCC) 08/26/2018  . VSD (ventricular septal defect), perimembranous   . Hyperextension injury of cervical spine, initial encounter 06/11/2018  . Alzheimer's disease with early onset (HCC) 06/11/2018  . Spinal cord injury to cervical region without bone injury (HCC) 06/11/2018  . Well adult exam 10/09/2015  . Abnormal TSH 10/09/2015  . Rash and nonspecific skin eruption 10/09/2015  . Depression 02/27/2014  . Down's syndrome 12/14/2013    Orientation RESPIRATION BLADDER Height & Weight     (Patient non-verbal)  Normal External catheter, Incontinent Weight:   Height:     BEHAVIORAL SYMPTOMS/MOOD NEUROLOGICAL BOWEL NUTRITION STATUS      Incontinent Diet(See discharge summary)  AMBULATORY STATUS COMMUNICATION OF NEEDS Skin   Extensive Assist Non-Verbally(Gestures appropriately) Normal                       Personal Care Assistance Level of Assistance  Bathing, Dressing, Feeding, Total care Bathing Assistance: Maximum assistance Feeding assistance: Maximum assistance Dressing Assistance:  Maximum assistance Total Care Assistance: Maximum assistance   Functional Limitations Info  Hearing, Speech, Sight Sight Info: Adequate Hearing Info: Adequate Speech Info: Impaired(Non-verbal/  Gestures appropriately)    SPECIAL CARE FACTORS FREQUENCY  PT (By licensed PT), OT (By licensed OT)     PT Frequency: 5x a week              Contractures Contractures Info: Not present    Additional Factors Info  Code Status, Allergies Code Status Info: Full Allergies Info: NKA           Current Medications (04/01/2020):  This is the current hospital active medication list Current Facility-Administered Medications  Medication Dose Route Frequency Provider Last Rate Last Admin  . acetaminophen (TYLENOL) tablet 650 mg  650 mg Oral Q6H PRN Nicki Guadalajara, MD       Or  . acetaminophen (TYLENOL) suppository 650 mg  650 mg Rectal Q6H PRN Nicki Guadalajara, MD      . aspirin chewable tablet 81 mg  81 mg Oral Daily Shirlean Mylar, MD   81 mg at 03/31/20 0901  . calcium carbonate (TUMS - dosed in mg elemental calcium) chewable tablet 400 mg of elemental calcium  400 mg of elemental calcium Oral TID Shirlean Mylar, MD   400 mg of elemental calcium at 03/31/20 2148  . clonazepam (KLONOPIN) disintegrating tablet 0.125 mg  0.125 mg Oral Daily Shirlean Mylar, MD      . donepezil (ARICEPT) tablet 5 mg  5 mg Oral QHS Shirlean Mylar, MD   5 mg at 03/31/20  2148  . enoxaparin (LOVENOX) injection 40 mg  40 mg Subcutaneous Q24H Gladys Damme, MD   40 mg at 03/31/20 0904  . levETIRAcetam (KEPPRA) tablet 500 mg  500 mg Oral 2 times per day Gladys Damme, MD   500 mg at 04/01/20 0733  . levothyroxine (SYNTHROID) tablet 75 mcg  75 mcg Oral Q0600 Gladys Damme, MD   75 mcg at 04/01/20 5465  . melatonin tablet 5 mg  5 mg Oral QHS Gladys Damme, MD   5 mg at 03/31/20 2148  . memantine (NAMENDA XR) 24 hr capsule 7 mg  7 mg Oral Daily Gladys Damme, MD   7 mg at 03/31/20 0901  .  multivitamin with minerals tablet 1 tablet  1 tablet Oral Daily Gladys Damme, MD   1 tablet at 03/31/20 0901  . olopatadine (PATANOL) 0.1 % ophthalmic solution 1 drop  1 drop Both Eyes BID Stark Klein, MD   1 drop at 03/31/20 2148  . polyethylene glycol (MIRALAX / GLYCOLAX) packet 17 g  17 g Oral Daily PRN Stark Klein, MD      . QUEtiapine (SEROQUEL) tablet 25 mg  25 mg Oral QHS PRN Gladys Damme, MD      . simvastatin (ZOCOR) tablet 20 mg  20 mg Oral QPM Gladys Damme, MD   20 mg at 03/31/20 1714  . sodium chloride flush (NS) 0.9 % injection 10-40 mL  10-40 mL Intracatheter Q12H Stark Klein, MD   10 mL at 03/30/20 2213  . sodium chloride flush (NS) 0.9 % injection 10-40 mL  10-40 mL Intracatheter PRN Stark Klein, MD      . triamcinolone ointment (KENALOG) 0.1 % 1 application  1 application Topical TID Stark Klein, MD   1 application at 68/12/75 2149     Discharge Medications: Please see discharge summary for a list of discharge medications.  Relevant Imaging Results:  Relevant Lab Results:   Additional Information  SSN [id number] Down Syndrome diagnosis  Arvella Merles, LCSWA

## 2020-04-01 NOTE — Social Work (Addendum)
Update: SSN provided [id number].  CSW made telephone call to patient's sister Arlina Robes to inquire on SSN for [facility]. Awaiting a call back.  [employer], [address]

## 2020-04-01 NOTE — TOC Progression Note (Addendum)
Transition of Care [facility]) - Progression Note    Patient Details  Name: Jasmine Ramirez MRN: [id number] Date of Birth: 01-23-1966  Transition of Care [facility]) CM/SW Contact  Nonda Lou, [place] Phone Number: 04/01/2020, 7:56 AM  Clinical Narrative:    CSW spoke with patient's sister Arlina Robes to discuss discharge plan. Sister agreed to have patient faxed out to SNFs for possible long-term placement. No further questions expressed at this time.   Expected Discharge Plan: Skilled Nursing Facility Barriers to Discharge: Awaiting State Approval [facility]), SNF Pending bed offer  Expected Discharge Plan and Services Expected Discharge Plan: Skilled Nursing Facility In-house Referral: Clinical Social Work     Living arrangements for the past 2 months: Group Home Expected Discharge Date: 03/30/20                                     Social Determinants of Health (SDOH) Interventions    Readmission Risk Interventions No flowsheet data found.

## 2020-04-02 MED ORDER — CLONAZEPAM 0.125 MG PO TBDP
0.1250 mg | ORAL_TABLET | Freq: Every day | ORAL | Status: DC
  Administered 2020-04-02 – 2020-04-03 (×2): 0.125 mg via ORAL
  Filled 2020-04-02 (×2): qty 1

## 2020-04-02 NOTE — TOC Progression Note (Cosign Needed Addendum)
Transition of Care [facility]) - Progression Note    Patient Details  Name: Jasmine Ramirez MRN: [id number] Date of Birth: 10-Feb-1966  [facility]) CM/SW Contact  Terrilee Croak, Student-Social Work Phone Number: 04/02/2020, 11:34 AM  Clinical Narrative:    MSW Intern spoke with pt's group home to confirm information for a possible SNF placement. They noted that she had been on Ativan, which did not help her behaviors, so they switched her to clonopin, which is why she was admitted. They also requested updates faxed to them at [phone]. Pt's sister sated it was ok to send them information. She was also given bed offers. SW will continue to follow for DC planning.  Expected Discharge Plan: Skilled Nursing Facility Barriers to Discharge: Awaiting State Approval [facility]), SNF Pending bed offer  Expected Discharge Plan and Services Expected Discharge Plan: Skilled Nursing Facility In-house Referral: Clinical Social Work     Living arrangements for the past 2 months: Group Home Expected Discharge Date: 03/30/20                                     Social Determinants of Health (SDOH) Interventions    Readmission Risk Interventions No flowsheet data found.

## 2020-04-02 NOTE — TOC Progression Note (Addendum)
Transition of Care [facility]) - Progression Note    Patient Details  Name: Jasmine Ramirez MRN: [id number] Date of Birth: June 29, 1966  Transition of [facility]) CM/SW Contact  Nonda Lou, [place] Phone Number: 04/02/2020, 2:20 PM  Clinical Narrative:     [facility] clinicals uploaded, pasrr pending.   Expected Discharge Plan: Skilled Nursing Facility Barriers to Discharge: Awaiting State Approval [facility]), SNF Pending bed offer  Expected Discharge Plan and Services Expected Discharge Plan: Skilled Nursing Facility In-house Referral: Clinical Social Work     Living arrangements for the past 2 months: Group Home Expected Discharge Date: 03/30/20                                     Social Determinants of Health (SDOH) Interventions    Readmission Risk Interventions No flowsheet data found.

## 2020-04-02 NOTE — Plan of Care (Signed)

## 2020-04-02 NOTE — Progress Notes (Signed)
Family Medicine Teaching Service Daily Progress Note Intern Pager: [phone]  Patient name: Jasmine Ramirez Medical record number: [id number] Date of birth: [birth year] Age: 54 y.o. Gender: female  Primary Care Provider: Lucretia Field Consultants: Neuro Code Status: Full  Pt Overview and Major Events to Date:  5/10 admitted, hypoxic event  Assessment and Plan: Jasmine Ramirez is a 54 y.o. female presenting with drowsiness. PMH is significant for Down Syndrome, hypothyroidism, seizures, early onset alzheimer's, heart murmur (VSD).    Increased Drowsiness- resolved Resolved.  Patient at her neurologic baseline. VSS. Klonopin tapered to 0.125. Thus far patient has not exhibited sundowning, agitation, or violent behaviors while in the hospital. Low dose quetiapine available for agitation/sundowning, as a second generation antipsychotic is preferred in patients with dementia. Will follow up with SW regarding disposition. Patient medically stable for discharge.  - Discontine prn quetiapine  - Continue low dose Klonipin  -Tylenol as needed -PT/OT -SW to assist with SNF placement   OSA  Hypoxic Events Patient has returned to her neurologic baseline of answering yes and no, and saying "ouch." She is alert, and not oriented (baseline). VSS while awake. Doing well. -sleep at an angle, or on side   Early onset Alzheimer's  Down Syndrome  Patient with a history of early onset Alzheimer's and Down syndrome.  Patient currently lives in a group home.  Home medications include Namenda and Aricept. Minimally verbal at baseline, answers yes or no questions, can say ouch. Recommend that family follow with palliative care outpatient to assess goals of care. -Continue home Aricept -Continue Namenda 7 mg   Hypothyroidism  Home medications include Synthroid 75 mcg -Continue home Synthroid   Hx of Seizures Patient on Keppra 500 mg twice daily.  No recent report of seizure activity. -Continue  Keppra 500 mg twice daily   Hyperlipidemia Home medication includes simvastatin 20 mg daily. -Continue simvastatin  FEN/GI: soft diet PPx: lovenox  Disposition: awaiting SNF/ALF placement  Subjective:  Patient comfortable in bed.   Objective: Temp:  [98 F (36.7 C)-99 F (37.2 C)] 99 F (37.2 C) (05/17 0835) Pulse Rate:  [57-69] 57 (05/17 0835) Resp:  [14-18] 14 (05/17 0835) BP: (102-116)/(45-68) 104/56 (05/17 0835) SpO2:  [96 %-100 %] 99 % (05/17 0835)  GEN: well appearing in no acute distress  CV: regular rate and rhythm, no murmurs appreciated  RESP: no increased work of breathing, clear to ascultation bilaterally  ABD:  Soft, Nontender, Nondistended.  MSK: no edema, or cyanosis noted SKIN: warm, dry, bruising on face    Laboratory: Recent Labs  Lab 03/27/20 0309 03/28/20 0733  WBC 6.4 5.4  HGB 12.2 12.2  HCT 36.5 36.8  PLT 167 164   Recent Labs  Lab 03/27/20 0309 03/28/20 0733  NA 138 137  K 4.3 4.2  CL 106 106  CO2 24 26  BUN 8 12  CREATININE 0.75 0.83  CALCIUM 7.7* 8.2*  PROT 5.3*  --   BILITOT 0.9  --   ALKPHOS 54  --   ALT 12  --   AST 18  --   GLUCOSE 83 110*   Imaging/Diagnostic Tests: No results found.    Katha Cabal, DO 04/02/2020, 8:54 AM PGY-1, [place] [facility] FPTS Intern pager: [phone], text pages welcome

## 2020-04-03 LAB — SARS CORONAVIRUS 2 BY RT PCR (HOSPITAL ORDER, PERFORMED IN ~~LOC~~ HOSPITAL LAB): SARS Coronavirus 2: NEGATIVE

## 2020-04-03 MED ORDER — CLONAZEPAM 0.25 MG PO TBDP
0.2500 mg | ORAL_TABLET | Freq: Every day | ORAL | Status: DC
  Administered 2020-04-04: 0.25 mg via ORAL
  Filled 2020-04-03: qty 1

## 2020-04-03 NOTE — Progress Notes (Signed)
Physical Therapy Treatment Patient Details Name: Jasmine Ramirez MRN: [id number] DOB: [birth year] Today's Date: 04/03/2020    History of Present Illness Pt is a 54 y/o female admitted secondary to increased lethargy, likely from recent medication change. PMH includes Down's Syndrome, seizures, and dementia.     PT Comments    Patient seen for PT treatment. Pt participates with initiation of mobility by therapist. Pt requires mod-max A +2 for functional transfer training this session. Continue to progress as tolerated with anticipated d/c to SNF for further skilled PT services.     Follow Up Recommendations  Supervision/Assistance - 24 hour;SNF     Equipment Recommendations  None recommended by PT    Recommendations for Other Services       Precautions / Restrictions Precautions Precautions: Fall Restrictions Weight Bearing Restrictions: No    Mobility  Bed Mobility Overal bed mobility: Needs Assistance Bed Mobility: Supine to Sit           General bed mobility comments: total A with HOB elevated; able to maintain sitting up right in long sitting and bed pad utilized to bring hips to EOB  Transfers Overall transfer level: Needs assistance Equipment used: 2 person hand held assist Transfers: Sit to/from [facility] Sit to Stand: +2 physical assistance;Max assist;Mod assist Stand pivot transfers: +2 physical assistance;Mod assist       General transfer comment: pt stood X 2 from EOB with knees blocked and bed pad to facilitate hip extension; bed pad used for pivot transfer as well as pt participates when mobility initiated  Ambulation/Gait                 Stairs             Wheelchair Mobility    Modified Rankin (Stroke Patients Only)       Balance Overall balance assessment: Needs assistance Sitting-balance support: No upper extremity supported;Feet supported Sitting balance-Leahy Scale: Fair     Standing balance  support: Bilateral upper extremity supported;During functional activity Standing balance-Leahy Scale: Poor                              Cognition Arousal/Alertness: Awake/alert Behavior During Therapy: WFL for tasks assessed/performed;Restless Overall Cognitive Status: History of cognitive impairments - at baseline                                 General Comments: Pt with Down's Syndrome and dementia at baseline      Exercises      General Comments        Pertinent Vitals/Pain Pain Assessment: Faces Faces Pain Scale: No hurt    Home Living                      Prior Function            PT Goals (current goals can now be found in the care plan section) Progress towards PT goals: Progressing toward goals    Frequency    Min 1X/week      PT Plan Frequency needs to be updated    Co-evaluation              AM-PAC PT "6 Clicks" Mobility   Outcome Measure  Help needed turning from your back to your side while in a flat bed without using bedrails?: A Lot Help needed moving  from lying on your back to sitting on the side of a flat bed without using bedrails?: A Lot Help needed moving to and from a bed to a chair (including a wheelchair)?: A Lot Help needed standing up from a chair using your arms (e.g., wheelchair or bedside chair)?: A Lot Help needed to walk in hospital room?: Total Help needed climbing 3-5 steps with a railing? : Total 6 Click Score: 10    End of Session Equipment Utilized During Treatment: Gait belt Activity Tolerance: Patient tolerated treatment well Patient left: with call bell/phone within reach;in chair;with chair alarm set Nurse Communication: Mobility status PT Visit Diagnosis: Other abnormalities of gait and mobility (R26.89);Muscle weakness (generalized) (M62.81);Difficulty in walking, not elsewhere classified (R26.2)     Time: 2277-3750 PT Time Calculation (min) (ACUTE ONLY): 14  min  Charges:  $Therapeutic Activity: 8-22 mins                     Erline Levine, PTA Acute Rehabilitation Services Pager: [phone] Office: [phone]     Carolynne Edouard 04/03/2020, 5:59 PM

## 2020-04-03 NOTE — Progress Notes (Signed)
Family Medicine Teaching Service Daily Progress Note Intern Pager: [phone]  Patient name: Jasmine Ramirez Medical record number: [id number] Date of birth: Jul 26, 1966 Age: 54 y.o. Gender: female  Primary Care Provider: Lucretia Field Consultants: Neuro Code Status: Full  Pt Overview and Major Events to Date:  5/10 admitted, hypoxic event  Assessment and Plan: Jasmine Ramirez is a 54 y.o. female presenting with drowsiness. PMH is significant for Down Syndrome, hypothyroidism, seizures, early onset alzheimer's, heart murmur (VSD).    Increased Drowsiness Resolved.  Patient at her neurologic baseline.  Continue low-dose Klonopin.  Patient has not showed any sundowning, agitation or gallop behaviors during her admission.  Follow-up with social work guarding disposition.  Patient medically stable for discharge. -Continue low-dose Klonopin -Tylenol as needed -PT/OT -Appreciate social work assistance with SNF placement   OSA  Hypoxic Events Patient has returned to her neurologic baseline of answering yes and no, and saying "ouch." She is alert, and not oriented (baseline). VSS while awake. Doing well. -sleep at an angle, or on side   Early onset Alzheimer's  Down Syndrome  Patient with a history of early onset Alzheimer's and Down syndrome.  Patient currently lives in a group home.  Home medications include Namenda and Aricept. Minimally verbal at baseline, answers yes or no questions, can say ouch. Recommend that family follow with palliative care outpatient to assess goals of care. -Continue home Aricept -Continue Namenda 7 mg   Hypothyroidism  Home medications include Synthroid 75 mcg -Continue home Synthroid   Hx of Seizures Patient on Keppra 500 mg twice daily.  No recent report of seizure activity. -Continue Keppra 500 mg twice daily   Hyperlipidemia Home medication includes simvastatin 20 mg daily. -Continue simvastatin  FEN/GI: soft diet PPx:  lovenox  Disposition: awaiting SNF/ALF placement  Subjective:  Patient comfortable and with minimal speech (at baseline).   Objective: Temp:  [98 F (36.7 C)-99 F (37.2 C)] 98.2 F (36.8 C) (05/18 0754) Pulse Rate:  [51-69] 51 (05/18 0754) Resp:  [14-18] 18 (05/18 0754) BP: (100-117)/(49-62) 107/62 (05/18 0754) SpO2:  [99 %-100 %] 99 % (05/18 0754)  GEN: resting comfortably in bed in no acute distress  CV: regular rate and rhythm, no audible murmur RESP: no increased work of breathing, clear to ascultation bilaterally ABD: Soft, Nontender, Nondistended.  MSK: non tender, normal muscle tone SKIN: periorbital facial bruising  NEURO: able to say ouch, yes and no (at baseline)       Laboratory: Recent Labs  Lab 03/28/20 0733  WBC 5.4  HGB 12.2  HCT 36.8  PLT 164   Recent Labs  Lab 03/28/20 0733  NA 137  K 4.2  CL 106  CO2 26  BUN 12  CREATININE 0.83  CALCIUM 8.2*  GLUCOSE 110*   Imaging/Diagnostic Tests: No results found.    Katha Cabal, DO 04/03/2020, 8:31 AM PGY-1, [facility] Family Medicine FPTS Intern pager: [phone], text pages welcome

## 2020-04-03 NOTE — Care Management (Signed)
PASRR is still under review.

## 2020-04-03 NOTE — TOC Progression Note (Cosign Needed)
Transition of Care [facility]) - Progression Note    Patient Details  Name: Jasmine Ramirez MRN: [id number] Date of Birth: [birth year]  Transition of Care [facility]) CM/SW Contact  Terrilee Croak, Student-Social Work Phone Number: 04/03/2020, 3:17 PM  Clinical Narrative:    MSW Intern spoke with pt's sister who updated sw that she would like the pt to go to [facility]. Facility notified.   Expected Discharge Plan: Skilled Nursing Facility Barriers to Discharge: Awaiting State Approval [facility]), SNF Pending bed offer  Expected Discharge Plan and Services Expected Discharge Plan: Skilled Nursing Facility In-house Referral: Clinical Social Work     Living arrangements for the past 2 months: Group Home Expected Discharge Date: 03/30/20                                     Social Determinants of Health (SDOH) Interventions    Readmission Risk Interventions No flowsheet data found.

## 2020-04-03 NOTE — Progress Notes (Signed)
Patient has early onset Alzheimers.   Katha Cabal, DO PGY-1, [facility] 04/03/2020

## 2020-04-04 DIAGNOSIS — G4700 Insomnia, unspecified: Secondary | ICD-10-CM | POA: Diagnosis not present

## 2020-04-04 DIAGNOSIS — R52 Pain, unspecified: Secondary | ICD-10-CM | POA: Diagnosis not present

## 2020-04-04 DIAGNOSIS — R4182 Altered mental status, unspecified: Secondary | ICD-10-CM | POA: Diagnosis not present

## 2020-04-04 DIAGNOSIS — R0989 Other specified symptoms and signs involving the circulatory and respiratory systems: Secondary | ICD-10-CM | POA: Diagnosis not present

## 2020-04-04 DIAGNOSIS — M2550 Pain in unspecified joint: Secondary | ICD-10-CM | POA: Diagnosis not present

## 2020-04-04 DIAGNOSIS — R5383 Other fatigue: Secondary | ICD-10-CM | POA: Diagnosis not present

## 2020-04-04 DIAGNOSIS — R142 Eructation: Secondary | ICD-10-CM | POA: Diagnosis not present

## 2020-04-04 DIAGNOSIS — F0280 Dementia in other diseases classified elsewhere without behavioral disturbance: Secondary | ICD-10-CM | POA: Diagnosis not present

## 2020-04-04 DIAGNOSIS — Z7401 Bed confinement status: Secondary | ICD-10-CM | POA: Diagnosis not present

## 2020-04-04 DIAGNOSIS — R627 Adult failure to thrive: Secondary | ICD-10-CM | POA: Diagnosis not present

## 2020-04-04 DIAGNOSIS — R451 Restlessness and agitation: Secondary | ICD-10-CM | POA: Diagnosis not present

## 2020-04-04 DIAGNOSIS — S13131S Dislocation of C2/C3 cervical vertebrae, sequela: Secondary | ICD-10-CM | POA: Diagnosis not present

## 2020-04-04 DIAGNOSIS — F0391 Unspecified dementia with behavioral disturbance: Secondary | ICD-10-CM | POA: Diagnosis not present

## 2020-04-04 DIAGNOSIS — F29 Unspecified psychosis not due to a substance or known physiological condition: Secondary | ICD-10-CM | POA: Diagnosis not present

## 2020-04-04 DIAGNOSIS — G300 Alzheimer's disease with early onset: Secondary | ICD-10-CM | POA: Diagnosis not present

## 2020-04-04 DIAGNOSIS — E785 Hyperlipidemia, unspecified: Secondary | ICD-10-CM | POA: Diagnosis not present

## 2020-04-04 DIAGNOSIS — E559 Vitamin D deficiency, unspecified: Secondary | ICD-10-CM | POA: Diagnosis not present

## 2020-04-04 DIAGNOSIS — R2681 Unsteadiness on feet: Secondary | ICD-10-CM | POA: Diagnosis not present

## 2020-04-04 DIAGNOSIS — G4089 Other seizures: Secondary | ICD-10-CM | POA: Diagnosis not present

## 2020-04-04 DIAGNOSIS — K219 Gastro-esophageal reflux disease without esophagitis: Secondary | ICD-10-CM | POA: Diagnosis not present

## 2020-04-04 DIAGNOSIS — Z9181 History of falling: Secondary | ICD-10-CM | POA: Diagnosis not present

## 2020-04-04 DIAGNOSIS — Q909 Down syndrome, unspecified: Secondary | ICD-10-CM | POA: Diagnosis not present

## 2020-04-04 DIAGNOSIS — F411 Generalized anxiety disorder: Secondary | ICD-10-CM | POA: Diagnosis not present

## 2020-04-04 DIAGNOSIS — R140 Abdominal distension (gaseous): Secondary | ICD-10-CM | POA: Diagnosis not present

## 2020-04-04 DIAGNOSIS — R0902 Hypoxemia: Secondary | ICD-10-CM | POA: Diagnosis not present

## 2020-04-04 DIAGNOSIS — F419 Anxiety disorder, unspecified: Secondary | ICD-10-CM | POA: Diagnosis not present

## 2020-04-04 DIAGNOSIS — R488 Other symbolic dysfunctions: Secondary | ICD-10-CM | POA: Diagnosis not present

## 2020-04-04 DIAGNOSIS — M6281 Muscle weakness (generalized): Secondary | ICD-10-CM | POA: Diagnosis not present

## 2020-04-04 DIAGNOSIS — R531 Weakness: Secondary | ICD-10-CM | POA: Diagnosis not present

## 2020-04-04 DIAGNOSIS — R05 Cough: Secondary | ICD-10-CM | POA: Diagnosis not present

## 2020-04-04 DIAGNOSIS — G309 Alzheimer's disease, unspecified: Secondary | ICD-10-CM | POA: Diagnosis not present

## 2020-04-04 DIAGNOSIS — G4733 Obstructive sleep apnea (adult) (pediatric): Secondary | ICD-10-CM | POA: Diagnosis not present

## 2020-04-04 DIAGNOSIS — E039 Hypothyroidism, unspecified: Secondary | ICD-10-CM | POA: Diagnosis not present

## 2020-04-04 DIAGNOSIS — R41841 Cognitive communication deficit: Secondary | ICD-10-CM | POA: Diagnosis not present

## 2020-04-04 DIAGNOSIS — R1312 Dysphagia, oropharyngeal phase: Secondary | ICD-10-CM | POA: Diagnosis not present

## 2020-04-04 NOTE — TOC Transition Note (Cosign Needed)
Transition of Care [facility]) - CM/SW Discharge Note   Patient Details  Name: Jasmine Ramirez MRN: [id number] Date of Birth: Nov 11, 1966  Transition of Care [facility]) CM/SW Contact:  Terrilee Croak, Student-Social Work Phone Number: 04/04/2020, 12:33 PM   Clinical Narrative:    Nurse to call report to [phone] Rm# [phone]   Final next level of care: Skilled Nursing Facility Barriers to Discharge: SNF Pending discharge orders   Patient Goals and CMS Choice   CMS Medicare.gov Compare Post Acute Care list provided to:: Patient Represenative (must comment)(Nichole) Choice offered to / list presented to : [facility] POA / Guardian(Nichole)  Discharge Placement              Patient chooses bed at: [facility] Patient to be transferred to facility by: [facility] Name of family member notified: Lorelle Formosa Patient and family notified of of transfer: 04/04/20  Discharge Plan and Services In-house Referral: Clinical Social Work                                   Social Determinants of Health (SDOH) Interventions     Readmission Risk Interventions No flowsheet data found.

## 2020-04-04 NOTE — Progress Notes (Signed)
Family Medicine Teaching Service Daily Progress Note Intern Pager: [phone]  Patient name: Jasmine Ramirez Medical record number: [id number] Date of birth: 25-Dec-1965 Age: 54 y.o. Gender: female  Primary Care Provider: Lucretia Field Consultants: Neuro Code Status: Full  Pt Overview and Major Events to Date:  5/10 admitted, hypoxic event  Assessment and Plan: Jasmine Ramirez is a 54 y.o. female presenting with drowsiness. PMH is significant for Down Syndrome, hypothyroidism, seizures, early onset alzheimer's, heart murmur (VSD).    Increased Drowsiness, Resolved  Patient at her neurological baseline.  No aggressive behavior during this admission.  Continue lower dose Klonipin (0.25 mg).  Medically stable for discharge to SNF.  Appreciate social work help with disposition.  -Continue low-dose Klonopin 0.25mg   -Tylenol as needed -PT/OT -Appreciate social work assistance with SNF placement   OSA  Hypoxic Events Patient has returned to her neurologic baseline of answering yes and no, and saying "ouch." She is alert, and not oriented (baseline). VSS while awake.  -sleep at an angle, or on side   Early onset Alzheimer's  Down Syndrome  Patient with a history of early onset Alzheimer's and Down syndrome.  Patient currently lives in a group home.  Home medications include Namenda and Aricept. Minimally verbal at baseline, answers yes or no questions, can say ouch. Recommend that family follow with palliative care outpatient to assess goals of care. -Continue home Aricept -Continue Namenda 7 mg   Hypothyroidism  Home medications include Synthroid 75 mcg -Continue home Synthroid   Hx of Seizures Patient on Keppra 500 mg twice daily.  No recent report of seizure activity. -Continue Keppra 500 mg twice daily   Hyperlipidemia Home medication includes simvastatin 20 mg daily. -Continue simvastatin  FEN/GI: soft diet PPx: lovenox  Disposition: awaiting SNF/ALF  placement  Subjective:  Patient without significant overnight events.    Objective: Temp:  [97.3 F (36.3 C)-98.3 F (36.8 C)] 97.8 F (36.6 C) (05/19 0724) Pulse Rate:  [43-91] 53 (05/19 0724) Resp:  [12-16] 14 (05/19 0724) BP: (97-116)/(44-85) 116/72 (05/19 0724) SpO2:  [97 %-100 %] 98 % (05/19 0724)  GEN: eating breakfast, no acute distress  CV: no murmur appreciated, regular rate and rhythm  RESP: clear to ascultation bilaterally, no increased work of breathing  ABD: soft, non tender  MSK: no edema, non-tender  SKIN: forehead and periorbital bruising  NEURO: at neurological baseline       Laboratory: No results for input(s): WBC, HGB, HCT, PLT in the last 168 hours. No results for input(s): NA, K, CL, CO2, BUN, CREATININE, CALCIUM, PROT, BILITOT, ALKPHOS, ALT, AST, GLUCOSE in the last 168 hours.  Invalid input(s): LABALBU Imaging/Diagnostic Tests: No results found.    Katha Cabal, DO 04/04/2020, 10:28 AM PGY-1, [place] [facility] FPTS Intern pager: [phone], text pages welcome

## 2020-04-04 NOTE — Progress Notes (Signed)
Report called to RN Marcello Moores at [facility]. Awaiting [facility] for transport.

## 2020-04-04 NOTE — Discharge Summary (Addendum)
[facility] Discharge Summary  Patient name: Jasmine Ramirez Medical record number: [id number] Date of birth: [birth year] Age: 54 y.o. Gender: female Date of Admission: 03/25/2020  Date of Discharge: 04/04/20  Admitting Physician: Gladys Damme, MD  Primary Care Provider: Trinidad Curet Consultants: Neurology   Indication for Hospitalization: altered mental status   Discharge Diagnoses/Problem List:  Altered mental status  Early onset Alzheimer's disease  Down Syndrome  Hypothyroidism  Hx of seizures  Hyperlipidemia    Disposition: SNF   Discharge Condition: Stable and improved   Discharge Exam:   GEN: eating breakfast, no acute distress  CV: no murmur appreciated, regular rate and rhythm  RESP: clear to ascultation bilaterally, no increased work of breathing  ABD: soft, non tender  MSK: no edema, non-tender  SKIN: forehead and periorbital bruising  NEURO: at neurological baseline  Taken from Dr. Birdena Crandall progress note on the day of discharge   Brief Hospital Course:  Increased Drowsiness  Altered Mental Status  Patient presented for concern of increased lethargy, drowsiness over the weekend. She also had fallen out of bed on 5/8 and sustained two black eyes, CT head negative for acute process other than periorbital swelling. Patient recently had medication change to klonopin 0.5 mg daily from loarzepam 1mg  daily for agitation/violent behavior related to increasing dementia (benzodiazepines initiated in November, 2020). Family noticed that patient was much more drowsy and lethargic than normal: not alert, head slumped in wheel chair. Initially there was concern for infectious process and patient was treated with broad spectrum antibiotics for one day. These were discontinued with negative blood and urine cultures. Repeat head CT without any acute intracranial process. EEG with baseline static encephalopathy most likely due to dementia, no  seizures or epileptiform activity. I suspect that patient may be more sensitive to klonopin than lorazepam, and benzodiazepines are not preferred in geriatric or dementia patients due to increased risk for delirium, sedation, falls, etc. Patient's was tapered to 0.25mg  Klonipin and was stable on this dose prior to discharge. Patient returned to her neurologic baseline of answering yes and no, and saying "ouch." She is alert, and not oriented (baseline).   OSA  Hypoxic Events Patient notably has quite loud snoring, and when sleeping supine and flat can have apneic events where she desaturates to the 30s, and her MAP and HR can similarly drop. When patient is turned on her side, these events do not occur. Patient was examined after one such apneic event by critical care management, who attribute the cause of the event to OSA. Attempted to use CPAP, but patient cannot tolerate it. She needed a mitten to prevent from removing leads and O2 tubing. Recommend patient sleep with O2 via [place] and at an inclined angle, and/or propped on her side, to prevent apneic events.  Early onset Alzheimer's  Down Syndrome  Patient with a history of early onset Alzheimer's and Down syndrome. Recommend family be referred to palliative care and begin goals of care discussions as dementia progresses.    Issues for Follow Up:  1. Decreased klonopin to 0.25 as she was doing well with this medication as far as AMS and aggression.  2. Prevent OSA by sleeping with head of bead >45*, O2 via nasal cannula 3. Consider referral to palliative care.   Significant Procedures:  None   Significant Labs and Imaging:  No results for input(s): WBC, HGB, HCT, PLT in the last 168 hours. No results for input(s): NA, K, CL, CO2, GLUCOSE, BUN,  CREATININE, CALCIUM, MG, PHOS, ALKPHOS, AST, ALT, ALBUMIN, PROTEIN in the last 168 hours.  Invalid input(s): TBILI    Results/Tests Pending at Time of Discharge: None  Discharge Medications:   Allergies as of 04/04/2020   No Known Allergies     Medication List    STOP taking these medications   clonazePAM 0.5 MG tablet Commonly known as: KLONOPIN Replaced by: clonazePAM 0.25 MG disintegrating tablet     TAKE these medications   acetaminophen 325 MG tablet Commonly known as: TYLENOL Take 650 mg by mouth 2 (two) times daily.   aspirin EC 81 MG tablet Take 81 mg by mouth daily.   CALCIUM 600 PO Take 600 mg by mouth 2 (two) times daily.   clonazePAM 0.25 MG disintegrating tablet Commonly known as: KLONOPIN Take 1 tablet (0.25 mg total) by mouth daily for 4 days. Replaces: clonazePAM 0.5 MG tablet   Denta 5000 Plus 1.1 % Crea dental cream Generic drug: sodium fluoride Place 1 application onto teeth at bedtime.   donepezil 5 MG tablet Commonly known as: ARICEPT Take 5 mg by mouth at bedtime.   Eye Allergy Itch Relief 0.2 % Soln Generic drug: Olopatadine HCl Place 1 drop into both eyes 2 (two) times daily.   levETIRAcetam 500 MG tablet Commonly known as: KEPPRA Take 1 tablet (500 mg total) by mouth 2 (two) times daily.   levothyroxine 75 MCG tablet Commonly known as: SYNTHROID Take 75 mcg by mouth daily before breakfast.   melatonin 5 MG Tabs Take 5 mg by mouth at bedtime.   memantine 7 MG Cp24 24 hr capsule Commonly known as: NAMENDA XR Take 7 mg by mouth daily.   NAC 600 MG Caps Generic drug: Acetylcysteine Take 600 mg by mouth 2 (two) times daily.   Omega-3 1000 MG Caps Take 1,000 mg by mouth 2 (two) times daily.   simvastatin 20 MG tablet Commonly known as: ZOCOR Take 20 mg by mouth every evening.   traZODone 50 MG tablet Commonly known as: DESYREL Take 50 mg by mouth at bedtime.   triamcinolone ointment 0.1 % Commonly known as: KENALOG Apply 1 application topically See admin instructions. Apply topically three times daily until blisters are healed.   Vitamin D3 50 MCG (2000 UT) Tabs Take 2,000 Units by mouth daily.        Discharge Instructions: Please refer to Patient Instructions section of EMR for full details.  Patient was counseled important signs and symptoms that should prompt return to medical care, changes in medications, dietary instructions, activity restrictions, and follow up appointments.   Follow-Up Appointments:  Contact information for follow-up providers    Royals, Gretta Began Follow up.   Specialty: Family Medicine Why: Please follow up hospital visit next [facility] information: [address] [place] [address] [phone]            Contact information for after-discharge care    Destination    [facility] Preferred SNF .   Service: Skilled Nursing Contact information: [address] [place] [address] [phone]                  Katha Cabal, DO 04/04/2020, 2:49 PM PGY-1, [facility]

## 2020-04-05 DIAGNOSIS — R531 Weakness: Secondary | ICD-10-CM | POA: Diagnosis not present

## 2020-04-05 DIAGNOSIS — E039 Hypothyroidism, unspecified: Secondary | ICD-10-CM | POA: Diagnosis not present

## 2020-04-05 DIAGNOSIS — G309 Alzheimer's disease, unspecified: Secondary | ICD-10-CM | POA: Diagnosis not present

## 2020-04-05 DIAGNOSIS — Q909 Down syndrome, unspecified: Secondary | ICD-10-CM | POA: Diagnosis not present

## 2020-04-09 ENCOUNTER — Other Ambulatory Visit: Payer: Self-pay

## 2020-04-09 ENCOUNTER — Non-Acute Institutional Stay: Payer: Medicare Other

## 2020-04-09 DIAGNOSIS — Z515 Encounter for palliative care: Secondary | ICD-10-CM

## 2020-04-10 NOTE — Progress Notes (Signed)
COMMUNITY PALLIATIVE CARE SW NOTE  PATIENT NAME: Jasmine Ramirez DOB: [birth year] MRN: [id number]  PRIMARY CARE PROVIDER: Lucretia Field  RESPONSIBLE PARTY:  Acct ID - Guarantor Home Phone Work Phone Relationship Acct Type  [id number] - Devincentis,J* [phone]  Self P/F     [address], [place], [place] [address]     PLAN OF CARE and INTERVENTIONS:             1. GOALS OF CARE/ ADVANCE CARE PLANNING:  Goal is for patient to remain at the facility for long-term placement. Patient is a FULL CODE.  2. SOCIAL/EMOTIONAL/SPIRITUAL ASSESSMENT/ INTERVENTIONS:  SW completed face-to-face visit with patient at the facility [facility]). Patient was found in bed. She was awake. She gave SW eye contact as SW greeted her and introduced herself. She was not verbal during this visit, despite prompting by Child psychotherapist. Patient did not appear to be in any pain or discomfort. SW consulted with the facility LPN-Charles.  Leonette Most report that patient is intellectually challenged and is also non-verbal with him. He report that she is receiving physical therapy. Patient is a FULL CODE. Her appetite is good. Her sister-Nicole is her PCG and a message was left for her extending support to her. SW provided supportive presence, assessed needs and comfort of patient, observation, verbal prompting and consultation with staff. 3. PATIENT/CAREGIVER EDUCATION/ COPING: Due to patient's intellectual disability and non-verbal, SW was unable to assess coping needs. However she did not appear to be in any distress or discomfort and staff reported no concerns.  4. PERSONAL EMERGENCY PLAN:  Per facility protocol. 5. COMMUNITY RESOURCES COORDINATION/ HEALTH CARE NAVIGATION:  Patient is currently receiving physical therapy.  6. FINANCIAL/LEGAL CONCERNS/INTERVENTIONS:  No financial or legal issues presented.      SOCIAL HX:  Social History   Tobacco Use  . Smoking status: Never Smoker  . Smokeless tobacco: Never  Used  Substance Use Topics  . Alcohol use: Never    CODE STATUS: FULL CODE ADVANCED DIRECTIVES: No MOST FORM COMPLETE:  No HOSPICE EDUCATION PROVIDED: No  PPS: Patient is intellectually challenged. She is currently receiving physical therapy.    Duration of visit and documentation 35 minutes.   [address] [place], [place]

## 2020-04-20 DIAGNOSIS — R05 Cough: Secondary | ICD-10-CM | POA: Diagnosis not present

## 2020-04-20 DIAGNOSIS — Q909 Down syndrome, unspecified: Secondary | ICD-10-CM | POA: Diagnosis not present

## 2020-04-20 DIAGNOSIS — F411 Generalized anxiety disorder: Secondary | ICD-10-CM | POA: Diagnosis not present

## 2020-04-20 DIAGNOSIS — R0989 Other specified symptoms and signs involving the circulatory and respiratory systems: Secondary | ICD-10-CM | POA: Diagnosis not present

## 2020-04-20 DIAGNOSIS — R451 Restlessness and agitation: Secondary | ICD-10-CM | POA: Diagnosis not present

## 2020-04-24 DIAGNOSIS — Q909 Down syndrome, unspecified: Secondary | ICD-10-CM | POA: Diagnosis not present

## 2020-04-24 DIAGNOSIS — R451 Restlessness and agitation: Secondary | ICD-10-CM | POA: Diagnosis not present

## 2020-04-24 DIAGNOSIS — F0391 Unspecified dementia with behavioral disturbance: Secondary | ICD-10-CM | POA: Diagnosis not present

## 2020-04-25 ENCOUNTER — Other Ambulatory Visit: Payer: Self-pay | Admitting: *Deleted

## 2020-04-25 DIAGNOSIS — G309 Alzheimer's disease, unspecified: Secondary | ICD-10-CM | POA: Diagnosis not present

## 2020-04-25 DIAGNOSIS — R451 Restlessness and agitation: Secondary | ICD-10-CM | POA: Diagnosis not present

## 2020-04-25 DIAGNOSIS — Q909 Down syndrome, unspecified: Secondary | ICD-10-CM | POA: Diagnosis not present

## 2020-04-25 DIAGNOSIS — F411 Generalized anxiety disorder: Secondary | ICD-10-CM | POA: Diagnosis not present

## 2020-04-25 DIAGNOSIS — R627 Adult failure to thrive: Secondary | ICD-10-CM | POA: Diagnosis not present

## 2020-04-25 NOTE — Patient Outreach (Signed)
Screened for potential [facility] Care Management needs as a benefit of  [employer].  Member is currently receiving skilled therapy at [facility].  Writer attended telephonic interdisciplinary team meeting to assess for disposition needs and transition plan for resident.   Facility reports member is from a group home. Transition plan will be to remain long term care.   Raiford Noble, MSN-Ed, RN,BSN [facility] Post Acute Care Coordinator [phone] [facility]) [phone]  (Toll free office)

## 2020-04-26 DIAGNOSIS — R05 Cough: Secondary | ICD-10-CM | POA: Diagnosis not present

## 2020-04-26 DIAGNOSIS — R1312 Dysphagia, oropharyngeal phase: Secondary | ICD-10-CM | POA: Diagnosis not present

## 2020-04-26 DIAGNOSIS — R451 Restlessness and agitation: Secondary | ICD-10-CM | POA: Diagnosis not present

## 2020-04-26 DIAGNOSIS — R142 Eructation: Secondary | ICD-10-CM | POA: Diagnosis not present

## 2020-04-26 DIAGNOSIS — R140 Abdominal distension (gaseous): Secondary | ICD-10-CM | POA: Diagnosis not present

## 2020-04-26 DIAGNOSIS — F411 Generalized anxiety disorder: Secondary | ICD-10-CM | POA: Diagnosis not present

## 2020-04-26 DIAGNOSIS — F0391 Unspecified dementia with behavioral disturbance: Secondary | ICD-10-CM | POA: Diagnosis not present

## 2020-05-07 ENCOUNTER — Other Ambulatory Visit: Payer: Self-pay | Admitting: *Deleted

## 2020-05-07 NOTE — Patient Outreach (Signed)
[facility] Post- Acute Care Coordinator follow up  Member has transitioned to long term care at [facility].   No identifiable [facility] Care Management needs at this time.   Raiford Noble, MSN-Ed, RN,BSN [facility] Post Acute Care Coordinator [phone] [facility]) [phone]  (Toll free office)

## 2020-05-11 ENCOUNTER — Other Ambulatory Visit: Payer: Self-pay

## 2020-05-11 ENCOUNTER — Non-Acute Institutional Stay: Payer: Medicare Other

## 2020-05-11 VITALS — Wt 180.1 lb

## 2020-05-11 DIAGNOSIS — Z515 Encounter for palliative care: Secondary | ICD-10-CM

## 2020-05-17 NOTE — Progress Notes (Signed)
PATIENT NAME: Jasmine Ramirez DOB: Mar 14, 1966 MRN: [id number]  PRIMARY CARE PROVIDER: Lucretia Field  RESPONSIBLE PARTY:  Acct ID - Guarantor Home Phone Work Phone Relationship Acct Type  [id number] - Matthew,J* [phone]  Self P/F     [address], [place], [place] [address]    PLAN OF CARE and INTERVENTIONS:               1.  GOALS OF CARE/ ADVANCE CARE PLANNING: Goal is for patient to remain at the facility for long-term placement. Patient is a FULL CODE.                2.  PATIENT/CAREGIVER EDUCATION: Education provided regarding behaviors as communicating needs, medications utilized to decrease anxiety                             3.  DISEASE STATUS: RN Palliative care visit. Observed patient in hallway coming back from lunch in her w/c. Patient is yelling out and pushing herself backwards in her chair. Poor safety awareness. Gently approached patient and assisted her to her room. Patient was able to make eye contact and say a few words. Speech is slightly garbled and at times difficult to understand. Patient would not allow physical assessment to be done. Became tearful and stated "leave me alone" Spoke with facility nurse who shared that patient is receiving prn ativan for agitation. Past Medical History includes but is not limited to Early Onset of Alzheimers, Down Syndrome, hypothyroidism, hx of seizures and hyperlipidemia.   HISTORY OF PRESENT ILLNESS:    CODE STATUS: Full Code ADVANCED DIRECTIVES: N MOST FORM: No PPS: 30%    (Duration of visit and charting 60 min)    Estanislado Pandy, RN

## 2020-05-21 ENCOUNTER — Other Ambulatory Visit: Payer: Self-pay

## 2020-05-21 ENCOUNTER — Non-Acute Institutional Stay: Payer: Medicare Other

## 2020-05-21 DIAGNOSIS — Z515 Encounter for palliative care: Secondary | ICD-10-CM

## 2020-05-22 NOTE — Progress Notes (Signed)
COMMUNITY PALLIATIVE CARE SW NOTE  PATIENT NAME: Jasmine Ramirez DOB: Jun 15, 1966 MRN: [id number]  PRIMARY CARE PROVIDER: Lucretia Field  RESPONSIBLE PARTY:  Acct ID - Guarantor Home Phone Work Phone Relationship Acct Type  [id number] - Stall,J* [phone]  Self P/F     [address] [place], [place] [address]     PLAN OF CARE and INTERVENTIONS:             1. GOALS OF CARE/ ADVANCE CARE PLANNING:  Goal is for patient to remain at the facility for long-term placement. Patient is a FULL CODE.  2. SOCIAL/EMOTIONAL/SPIRITUAL ASSESSMENT/ INTERVENTIONS:  SW completed a monthly visit with patient at the facility [facility]). Patient was sitting in the hallway in her wheelchair. Patient was with the physical therapy staff and they were playing [place] for her. Patient was verbally responsive to simple yes/no questions. Her verbalizations were delayed, but appropriate. SW consulted with the CNA-Tish who reported that patient is eating and sleeping well. Patient is getting up and out of her room more. Patient is mentally challenged, but make attempt to be present for activities and she enjoys music. Patient has intermittent periods of agitation where she will yell our and refuse care and med. Those behaviors are managed with Ativan. She remains a FULL CODE.  3. PATIENT/CAREGIVER EDUCATION/ COPING:  Due to patient's intellectual disability (Down Syndrome) and non-verbal, SW was unable to assess coping needs. However she did not appear to be in any distress or discomfort and staff reported no concerns. Her sister is very supportive. 4. PERSONAL EMERGENCY PLAN:  Per facility protocol. 5. COMMUNITY RESOURCES COORDINATION/ HEALTH CARE NAVIGATION:  Patient is receiving physical therapy but 6. FINANCIAL/LEGAL CONCERNS/INTERVENTIONS:  No legal or financial concerns.     SOCIAL HX:  Social History   Tobacco Use  . Smoking status: Never Smoker  . Smokeless tobacco: Never Used   Substance Use Topics  . Alcohol use: Never    CODE STATUS: FULL CODE ADVANCED DIRECTIVES: No MOST FORM COMPLETE:  No HOSPICE EDUCATION PROVIDED: No  PPS: Patient is intellectually challenged due to Down Syndrome, but is alert and oriented to self only. She is currently receiving physical therapy.     Duration of visit and documentation: 45 minutes   Clydia Llano, LCSW

## 2020-05-25 DIAGNOSIS — G309 Alzheimer's disease, unspecified: Secondary | ICD-10-CM | POA: Diagnosis not present

## 2020-05-25 DIAGNOSIS — F411 Generalized anxiety disorder: Secondary | ICD-10-CM | POA: Diagnosis not present

## 2020-05-25 DIAGNOSIS — Q909 Down syndrome, unspecified: Secondary | ICD-10-CM | POA: Diagnosis not present

## 2020-05-25 DIAGNOSIS — R451 Restlessness and agitation: Secondary | ICD-10-CM | POA: Diagnosis not present

## 2020-06-07 DIAGNOSIS — G4701 Insomnia due to medical condition: Secondary | ICD-10-CM | POA: Diagnosis not present

## 2020-06-07 DIAGNOSIS — Q909 Down syndrome, unspecified: Secondary | ICD-10-CM | POA: Diagnosis not present

## 2020-06-07 DIAGNOSIS — G300 Alzheimer's disease with early onset: Secondary | ICD-10-CM | POA: Diagnosis not present

## 2020-06-07 DIAGNOSIS — F064 Anxiety disorder due to known physiological condition: Secondary | ICD-10-CM | POA: Diagnosis not present

## 2020-06-13 DIAGNOSIS — F411 Generalized anxiety disorder: Secondary | ICD-10-CM | POA: Diagnosis not present

## 2020-06-13 DIAGNOSIS — G4700 Insomnia, unspecified: Secondary | ICD-10-CM | POA: Diagnosis not present

## 2020-06-13 DIAGNOSIS — R627 Adult failure to thrive: Secondary | ICD-10-CM | POA: Diagnosis not present

## 2020-06-13 DIAGNOSIS — E039 Hypothyroidism, unspecified: Secondary | ICD-10-CM | POA: Diagnosis not present

## 2020-06-13 DIAGNOSIS — E785 Hyperlipidemia, unspecified: Secondary | ICD-10-CM | POA: Diagnosis not present

## 2020-06-13 DIAGNOSIS — Z6836 Body mass index (BMI) 36.0-36.9, adult: Secondary | ICD-10-CM | POA: Diagnosis not present

## 2020-06-13 DIAGNOSIS — K219 Gastro-esophageal reflux disease without esophagitis: Secondary | ICD-10-CM | POA: Diagnosis not present

## 2020-06-13 DIAGNOSIS — G4089 Other seizures: Secondary | ICD-10-CM | POA: Diagnosis not present

## 2020-06-13 DIAGNOSIS — E559 Vitamin D deficiency, unspecified: Secondary | ICD-10-CM | POA: Diagnosis not present

## 2020-06-13 DIAGNOSIS — R451 Restlessness and agitation: Secondary | ICD-10-CM | POA: Diagnosis not present

## 2020-06-13 DIAGNOSIS — Q909 Down syndrome, unspecified: Secondary | ICD-10-CM | POA: Diagnosis not present

## 2020-06-13 DIAGNOSIS — G309 Alzheimer's disease, unspecified: Secondary | ICD-10-CM | POA: Diagnosis not present

## 2020-06-15 DIAGNOSIS — I10 Essential (primary) hypertension: Secondary | ICD-10-CM | POA: Diagnosis not present

## 2020-06-19 DIAGNOSIS — G309 Alzheimer's disease, unspecified: Secondary | ICD-10-CM | POA: Diagnosis not present

## 2020-06-19 DIAGNOSIS — R451 Restlessness and agitation: Secondary | ICD-10-CM | POA: Diagnosis not present

## 2020-06-19 DIAGNOSIS — Q909 Down syndrome, unspecified: Secondary | ICD-10-CM | POA: Diagnosis not present

## 2020-06-19 DIAGNOSIS — F411 Generalized anxiety disorder: Secondary | ICD-10-CM | POA: Diagnosis not present

## 2020-06-19 DIAGNOSIS — R5383 Other fatigue: Secondary | ICD-10-CM | POA: Diagnosis not present

## 2020-06-20 DIAGNOSIS — H26493 Other secondary cataract, bilateral: Secondary | ICD-10-CM | POA: Diagnosis not present

## 2020-06-20 DIAGNOSIS — Z7951 Long term (current) use of inhaled steroids: Secondary | ICD-10-CM | POA: Diagnosis not present

## 2020-06-20 DIAGNOSIS — Z961 Presence of intraocular lens: Secondary | ICD-10-CM | POA: Diagnosis not present

## 2020-06-21 DIAGNOSIS — G309 Alzheimer's disease, unspecified: Secondary | ICD-10-CM | POA: Diagnosis not present

## 2020-06-21 DIAGNOSIS — R451 Restlessness and agitation: Secondary | ICD-10-CM | POA: Diagnosis not present

## 2020-06-21 DIAGNOSIS — Q909 Down syndrome, unspecified: Secondary | ICD-10-CM | POA: Diagnosis not present

## 2020-06-21 DIAGNOSIS — R05 Cough: Secondary | ICD-10-CM | POA: Diagnosis not present

## 2020-06-21 DIAGNOSIS — R1312 Dysphagia, oropharyngeal phase: Secondary | ICD-10-CM | POA: Diagnosis not present

## 2020-06-21 DIAGNOSIS — G4733 Obstructive sleep apnea (adult) (pediatric): Secondary | ICD-10-CM | POA: Diagnosis not present

## 2020-06-21 DIAGNOSIS — F411 Generalized anxiety disorder: Secondary | ICD-10-CM | POA: Diagnosis not present

## 2020-06-21 DIAGNOSIS — R142 Eructation: Secondary | ICD-10-CM | POA: Diagnosis not present

## 2020-06-22 DIAGNOSIS — R1312 Dysphagia, oropharyngeal phase: Secondary | ICD-10-CM | POA: Diagnosis not present

## 2020-06-22 DIAGNOSIS — G4733 Obstructive sleep apnea (adult) (pediatric): Secondary | ICD-10-CM | POA: Diagnosis not present

## 2020-06-22 DIAGNOSIS — G309 Alzheimer's disease, unspecified: Secondary | ICD-10-CM | POA: Diagnosis not present

## 2020-06-22 DIAGNOSIS — Q909 Down syndrome, unspecified: Secondary | ICD-10-CM | POA: Diagnosis not present

## 2020-06-24 DIAGNOSIS — G309 Alzheimer's disease, unspecified: Secondary | ICD-10-CM | POA: Diagnosis not present

## 2020-06-24 DIAGNOSIS — Q909 Down syndrome, unspecified: Secondary | ICD-10-CM | POA: Diagnosis not present

## 2020-06-24 DIAGNOSIS — G4733 Obstructive sleep apnea (adult) (pediatric): Secondary | ICD-10-CM | POA: Diagnosis not present

## 2020-06-24 DIAGNOSIS — R1312 Dysphagia, oropharyngeal phase: Secondary | ICD-10-CM | POA: Diagnosis not present

## 2020-06-25 DIAGNOSIS — G4733 Obstructive sleep apnea (adult) (pediatric): Secondary | ICD-10-CM | POA: Diagnosis not present

## 2020-06-25 DIAGNOSIS — Q909 Down syndrome, unspecified: Secondary | ICD-10-CM | POA: Diagnosis not present

## 2020-06-25 DIAGNOSIS — G309 Alzheimer's disease, unspecified: Secondary | ICD-10-CM | POA: Diagnosis not present

## 2020-06-25 DIAGNOSIS — R1312 Dysphagia, oropharyngeal phase: Secondary | ICD-10-CM | POA: Diagnosis not present

## 2020-06-26 DIAGNOSIS — Q909 Down syndrome, unspecified: Secondary | ICD-10-CM | POA: Diagnosis not present

## 2020-06-26 DIAGNOSIS — G4733 Obstructive sleep apnea (adult) (pediatric): Secondary | ICD-10-CM | POA: Diagnosis not present

## 2020-06-26 DIAGNOSIS — G309 Alzheimer's disease, unspecified: Secondary | ICD-10-CM | POA: Diagnosis not present

## 2020-06-26 DIAGNOSIS — R1312 Dysphagia, oropharyngeal phase: Secondary | ICD-10-CM | POA: Diagnosis not present

## 2020-06-27 DIAGNOSIS — Q909 Down syndrome, unspecified: Secondary | ICD-10-CM | POA: Diagnosis not present

## 2020-06-27 DIAGNOSIS — R1312 Dysphagia, oropharyngeal phase: Secondary | ICD-10-CM | POA: Diagnosis not present

## 2020-06-27 DIAGNOSIS — G309 Alzheimer's disease, unspecified: Secondary | ICD-10-CM | POA: Diagnosis not present

## 2020-06-27 DIAGNOSIS — G4733 Obstructive sleep apnea (adult) (pediatric): Secondary | ICD-10-CM | POA: Diagnosis not present

## 2020-06-28 DIAGNOSIS — R1312 Dysphagia, oropharyngeal phase: Secondary | ICD-10-CM | POA: Diagnosis not present

## 2020-06-28 DIAGNOSIS — Q909 Down syndrome, unspecified: Secondary | ICD-10-CM | POA: Diagnosis not present

## 2020-06-28 DIAGNOSIS — G4733 Obstructive sleep apnea (adult) (pediatric): Secondary | ICD-10-CM | POA: Diagnosis not present

## 2020-06-28 DIAGNOSIS — G309 Alzheimer's disease, unspecified: Secondary | ICD-10-CM | POA: Diagnosis not present

## 2020-06-29 DIAGNOSIS — F064 Anxiety disorder due to known physiological condition: Secondary | ICD-10-CM | POA: Diagnosis not present

## 2020-06-29 DIAGNOSIS — G4701 Insomnia due to medical condition: Secondary | ICD-10-CM | POA: Diagnosis not present

## 2020-06-29 DIAGNOSIS — G300 Alzheimer's disease with early onset: Secondary | ICD-10-CM | POA: Diagnosis not present

## 2020-06-29 DIAGNOSIS — F0281 Dementia in other diseases classified elsewhere with behavioral disturbance: Secondary | ICD-10-CM | POA: Diagnosis not present

## 2020-06-29 DIAGNOSIS — Q909 Down syndrome, unspecified: Secondary | ICD-10-CM | POA: Diagnosis not present

## 2020-07-02 DIAGNOSIS — Q909 Down syndrome, unspecified: Secondary | ICD-10-CM | POA: Diagnosis not present

## 2020-07-02 DIAGNOSIS — G4733 Obstructive sleep apnea (adult) (pediatric): Secondary | ICD-10-CM | POA: Diagnosis not present

## 2020-07-02 DIAGNOSIS — R1312 Dysphagia, oropharyngeal phase: Secondary | ICD-10-CM | POA: Diagnosis not present

## 2020-07-02 DIAGNOSIS — G309 Alzheimer's disease, unspecified: Secondary | ICD-10-CM | POA: Diagnosis not present

## 2020-07-03 DIAGNOSIS — Q909 Down syndrome, unspecified: Secondary | ICD-10-CM | POA: Diagnosis not present

## 2020-07-03 DIAGNOSIS — G309 Alzheimer's disease, unspecified: Secondary | ICD-10-CM | POA: Diagnosis not present

## 2020-07-03 DIAGNOSIS — R1312 Dysphagia, oropharyngeal phase: Secondary | ICD-10-CM | POA: Diagnosis not present

## 2020-07-03 DIAGNOSIS — G4733 Obstructive sleep apnea (adult) (pediatric): Secondary | ICD-10-CM | POA: Diagnosis not present

## 2020-07-04 DIAGNOSIS — G4733 Obstructive sleep apnea (adult) (pediatric): Secondary | ICD-10-CM | POA: Diagnosis not present

## 2020-07-04 DIAGNOSIS — Q909 Down syndrome, unspecified: Secondary | ICD-10-CM | POA: Diagnosis not present

## 2020-07-04 DIAGNOSIS — R1312 Dysphagia, oropharyngeal phase: Secondary | ICD-10-CM | POA: Diagnosis not present

## 2020-07-04 DIAGNOSIS — G309 Alzheimer's disease, unspecified: Secondary | ICD-10-CM | POA: Diagnosis not present

## 2020-07-11 ENCOUNTER — Non-Acute Institutional Stay: Payer: Medicare Other

## 2020-07-11 ENCOUNTER — Other Ambulatory Visit: Payer: Self-pay

## 2020-07-11 DIAGNOSIS — Z515 Encounter for palliative care: Secondary | ICD-10-CM

## 2020-07-11 NOTE — Progress Notes (Signed)
COMMUNITY PALLIATIVE CARE SW NOTE  PATIENT NAME: Jasmine Ramirez DOB: [birth year] MRN: [id number]  PRIMARY CARE PROVIDER: Lucretia Field  RESPONSIBLE PARTY:  Acct ID - Guarantor Home Phone Work Phone Relationship Acct Type  [id number] - Woollard,J* [phone]  Self P/F     [address] [place], [place] [address]     PLAN OF CARE and INTERVENTIONS:             1. GOALS OF CARE/ ADVANCE CARE PLANNING:  Goal is for patient to remain at the facility for long-term placement. Patient is a FULL CODE. 2. SOCIAL/EMOTIONAL/SPIRITUAL ASSESSMENT/ INTERVENTIONS:    SW completed a monthly visit with patient at the facility [facility]). Patient was in bed sleeping. She did not arouse to SW verbal prompts. However a staff came into feed her and patient aroused. The staff stated that patient has been sleepy and sleeping most of the day. Despite prompting, patient was not verbal with SW or facility staff. She ate her food well. SW consulted with the facility nurse-Heather, LPN. Patient's intake is about 60% of all meals. Patient's current weight is 146 lbs which is down from 167 lbs in July. Patient will be re-weighed to determine accuracy.She has intermittent moments where she yells out and is resistant to care. She is taking Depakote 125 mg and PRN Ativan to mange her agitation and behaviors. Patient is a 1-2 person assist. Facility care plan meeting for patient was yesterday and her family did not attend. SW provided supportive presence, verbal prompts, observation, assessment of patient needs and comfort, care coordination and consultation with facility staff. SW to provide ongoing psychosocial assessment of patient needs and provide support.  3. PATIENT/CAREGIVER EDUCATION/ COPING:  Due to patient's intellectual disability (Down Syndrome) and non-verbal, SW was unable to assess coping needs. However she did not appear to be in any distress or discomfort and staff reported no concerns. Her  sister is very supportive 4. PERSONAL EMERGENCY PLAN:  Per facility protocol. 5. COMMUNITY RESOURCES COORDINATION/ HEALTH CARE NAVIGATION:  Due to facility COVID restrictions, patient is spending time in room. 6. FINANCIAL/LEGAL CONCERNS/INTERVENTIONS:  None.     SOCIAL HX:  Social History   Tobacco Use  . Smoking status: Never Smoker  . Smokeless tobacco: Never Used  Substance Use Topics  . Alcohol use: Never    CODE STATUS: FULL CODE ADVANCED DIRECTIVES: No MOST FORM COMPLETE:  No HOSPICE EDUCATION PROVIDED: No  PPS: Patient is intellectually challenged due to Down Syndrome, but is alert and oriented to self only.   Duration of visit and documentation: 30 minutes.      [address] [place], [place]

## 2020-07-20 DIAGNOSIS — G4701 Insomnia due to medical condition: Secondary | ICD-10-CM | POA: Diagnosis not present

## 2020-07-20 DIAGNOSIS — Z5181 Encounter for therapeutic drug level monitoring: Secondary | ICD-10-CM | POA: Diagnosis not present

## 2020-07-20 DIAGNOSIS — F064 Anxiety disorder due to known physiological condition: Secondary | ICD-10-CM | POA: Diagnosis not present

## 2020-07-20 DIAGNOSIS — Q909 Down syndrome, unspecified: Secondary | ICD-10-CM | POA: Diagnosis not present

## 2020-07-20 DIAGNOSIS — F0281 Dementia in other diseases classified elsewhere with behavioral disturbance: Secondary | ICD-10-CM | POA: Diagnosis not present

## 2020-07-20 DIAGNOSIS — G300 Alzheimer's disease with early onset: Secondary | ICD-10-CM | POA: Diagnosis not present

## 2020-08-03 DIAGNOSIS — G300 Alzheimer's disease with early onset: Secondary | ICD-10-CM | POA: Diagnosis not present

## 2020-08-03 DIAGNOSIS — Q909 Down syndrome, unspecified: Secondary | ICD-10-CM | POA: Diagnosis not present

## 2020-08-03 DIAGNOSIS — F064 Anxiety disorder due to known physiological condition: Secondary | ICD-10-CM | POA: Diagnosis not present

## 2020-08-03 DIAGNOSIS — F0281 Dementia in other diseases classified elsewhere with behavioral disturbance: Secondary | ICD-10-CM | POA: Diagnosis not present

## 2020-08-03 DIAGNOSIS — G4701 Insomnia due to medical condition: Secondary | ICD-10-CM | POA: Diagnosis not present

## 2020-08-03 DIAGNOSIS — Z5181 Encounter for therapeutic drug level monitoring: Secondary | ICD-10-CM | POA: Diagnosis not present

## 2020-08-10 DIAGNOSIS — E785 Hyperlipidemia, unspecified: Secondary | ICD-10-CM | POA: Diagnosis not present

## 2020-08-10 DIAGNOSIS — G309 Alzheimer's disease, unspecified: Secondary | ICD-10-CM | POA: Diagnosis not present

## 2020-08-10 DIAGNOSIS — F411 Generalized anxiety disorder: Secondary | ICD-10-CM | POA: Diagnosis not present

## 2020-08-10 DIAGNOSIS — E039 Hypothyroidism, unspecified: Secondary | ICD-10-CM | POA: Diagnosis not present

## 2020-08-10 DIAGNOSIS — R627 Adult failure to thrive: Secondary | ICD-10-CM | POA: Diagnosis not present

## 2020-08-10 DIAGNOSIS — Z9181 History of falling: Secondary | ICD-10-CM | POA: Diagnosis not present

## 2020-08-10 DIAGNOSIS — G4089 Other seizures: Secondary | ICD-10-CM | POA: Diagnosis not present

## 2020-08-10 DIAGNOSIS — E559 Vitamin D deficiency, unspecified: Secondary | ICD-10-CM | POA: Diagnosis not present

## 2020-08-10 DIAGNOSIS — R451 Restlessness and agitation: Secondary | ICD-10-CM | POA: Diagnosis not present

## 2020-08-10 DIAGNOSIS — Z6833 Body mass index (BMI) 33.0-33.9, adult: Secondary | ICD-10-CM | POA: Diagnosis not present

## 2020-08-10 DIAGNOSIS — K219 Gastro-esophageal reflux disease without esophagitis: Secondary | ICD-10-CM | POA: Diagnosis not present

## 2020-08-10 DIAGNOSIS — G4700 Insomnia, unspecified: Secondary | ICD-10-CM | POA: Diagnosis not present

## 2020-08-17 DIAGNOSIS — Q909 Down syndrome, unspecified: Secondary | ICD-10-CM | POA: Diagnosis not present

## 2020-08-17 DIAGNOSIS — F0281 Dementia in other diseases classified elsewhere with behavioral disturbance: Secondary | ICD-10-CM | POA: Diagnosis not present

## 2020-08-17 DIAGNOSIS — G4701 Insomnia due to medical condition: Secondary | ICD-10-CM | POA: Diagnosis not present

## 2020-08-17 DIAGNOSIS — F064 Anxiety disorder due to known physiological condition: Secondary | ICD-10-CM | POA: Diagnosis not present

## 2020-08-17 DIAGNOSIS — G300 Alzheimer's disease with early onset: Secondary | ICD-10-CM | POA: Diagnosis not present

## 2020-09-13 DIAGNOSIS — F0281 Dementia in other diseases classified elsewhere with behavioral disturbance: Secondary | ICD-10-CM | POA: Diagnosis not present

## 2020-09-13 DIAGNOSIS — G4701 Insomnia due to medical condition: Secondary | ICD-10-CM | POA: Diagnosis not present

## 2020-09-13 DIAGNOSIS — Q909 Down syndrome, unspecified: Secondary | ICD-10-CM | POA: Diagnosis not present

## 2020-09-13 DIAGNOSIS — F064 Anxiety disorder due to known physiological condition: Secondary | ICD-10-CM | POA: Diagnosis not present

## 2020-09-20 DIAGNOSIS — G4733 Obstructive sleep apnea (adult) (pediatric): Secondary | ICD-10-CM | POA: Diagnosis not present

## 2020-09-20 DIAGNOSIS — G309 Alzheimer's disease, unspecified: Secondary | ICD-10-CM | POA: Diagnosis not present

## 2020-09-20 DIAGNOSIS — R1312 Dysphagia, oropharyngeal phase: Secondary | ICD-10-CM | POA: Diagnosis not present

## 2020-09-20 DIAGNOSIS — Q909 Down syndrome, unspecified: Secondary | ICD-10-CM | POA: Diagnosis not present

## 2020-09-20 DIAGNOSIS — Z23 Encounter for immunization: Secondary | ICD-10-CM | POA: Diagnosis not present

## 2020-09-20 DIAGNOSIS — M6281 Muscle weakness (generalized): Secondary | ICD-10-CM | POA: Diagnosis not present

## 2020-09-21 DIAGNOSIS — Z23 Encounter for immunization: Secondary | ICD-10-CM | POA: Diagnosis not present

## 2020-09-21 DIAGNOSIS — G309 Alzheimer's disease, unspecified: Secondary | ICD-10-CM | POA: Diagnosis not present

## 2020-09-21 DIAGNOSIS — G4733 Obstructive sleep apnea (adult) (pediatric): Secondary | ICD-10-CM | POA: Diagnosis not present

## 2020-09-21 DIAGNOSIS — Q909 Down syndrome, unspecified: Secondary | ICD-10-CM | POA: Diagnosis not present

## 2020-09-21 DIAGNOSIS — M6281 Muscle weakness (generalized): Secondary | ICD-10-CM | POA: Diagnosis not present

## 2020-09-21 DIAGNOSIS — R1312 Dysphagia, oropharyngeal phase: Secondary | ICD-10-CM | POA: Diagnosis not present

## 2020-09-21 IMAGING — CT CT CERVICAL SPINE W/O CM
3 of 6 series · 11 of 33 positions shown, 12 images · non-contrast
Comparison: June 11, 2018

CLINICAL DATA: Status post fall with altered mental status.

EXAM:
CT CERVICAL SPINE WITHOUT CONTRAST
TECHNIQUE: Multidetector CT imaging of the cervical spine was performed without
intravenous contrast. Multiplanar CT image reconstructions were also
generated.

[Series 11: coronals · coronal · 0.29mm/px · 3 of 48 slices shown]
[im 11/48  bone]
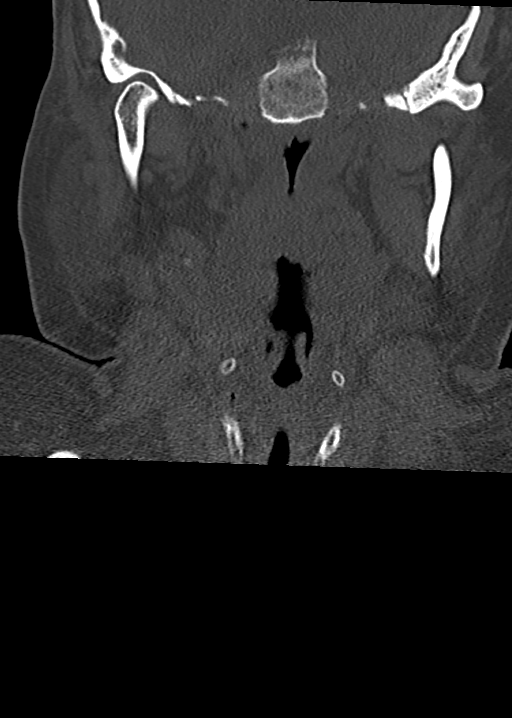
[im 20/48  bone]
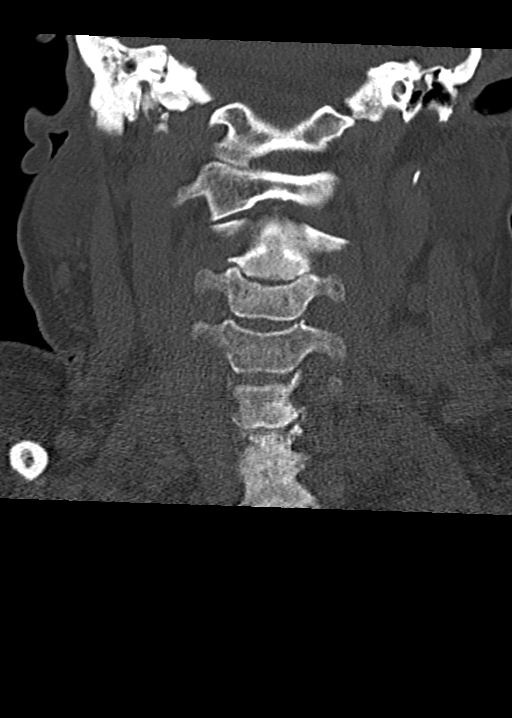
[im 28/48  bone]
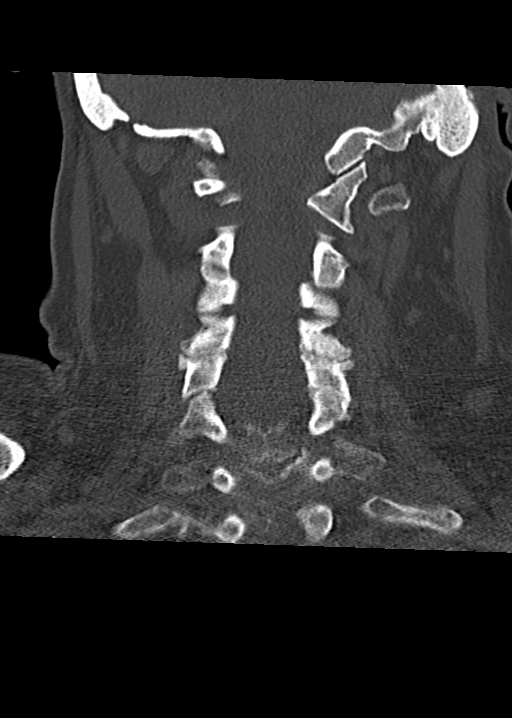

[Series 12: sagittals · sagittal · 0.19mm/px · 5 of 66 slices shown]
[im 11/66  bone]
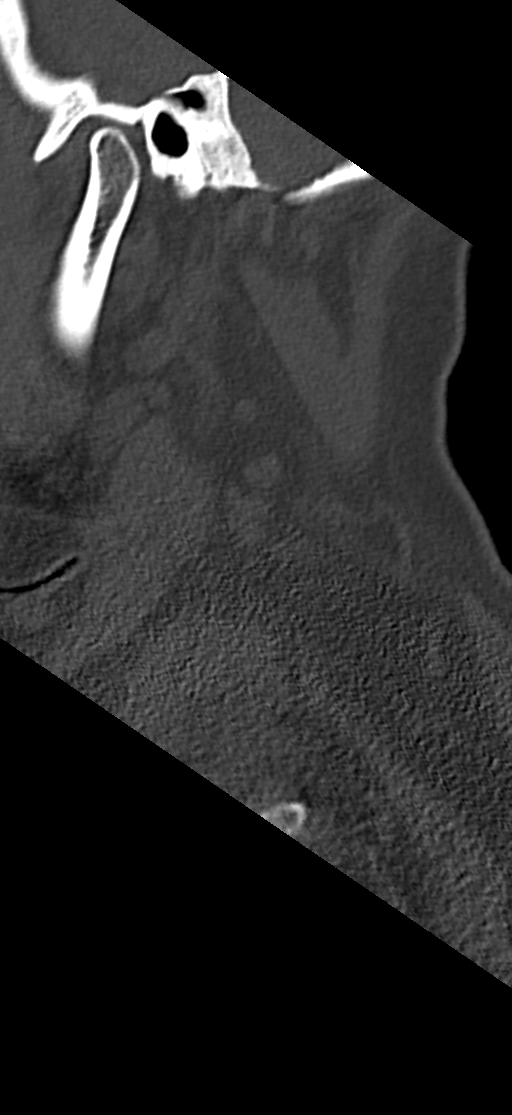
[im 22/66  bone]
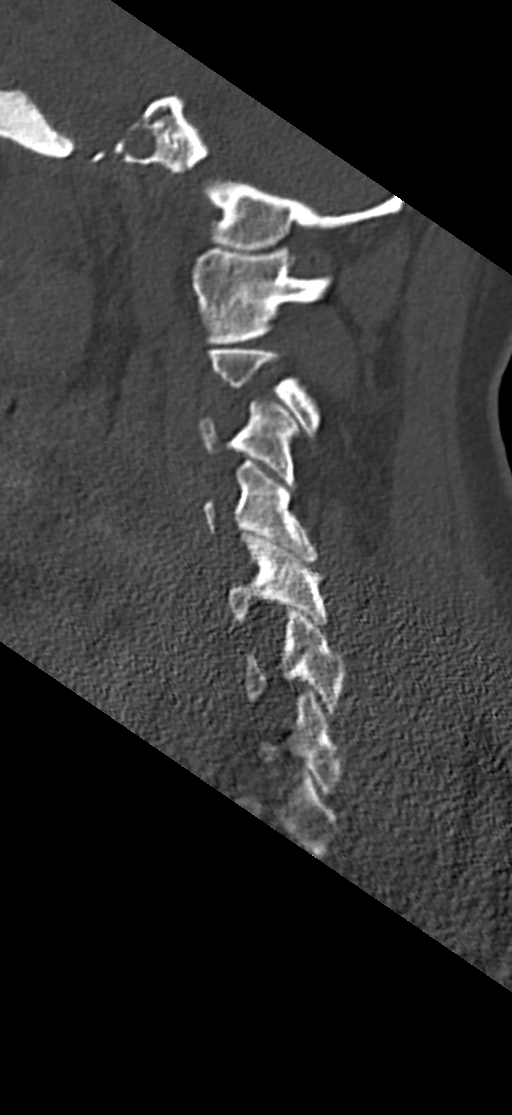
[im 33/66  bone]
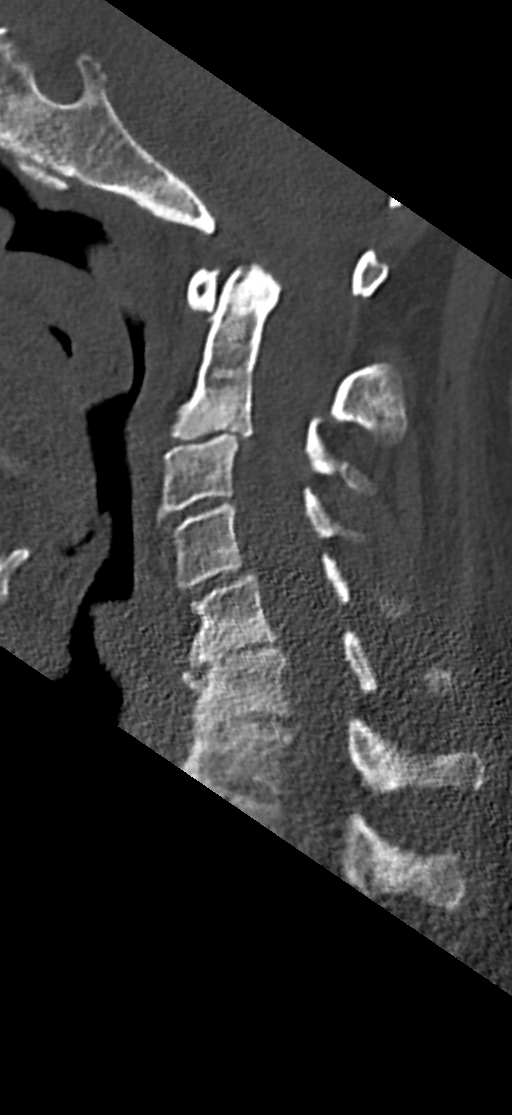
[im 44/66  bone]
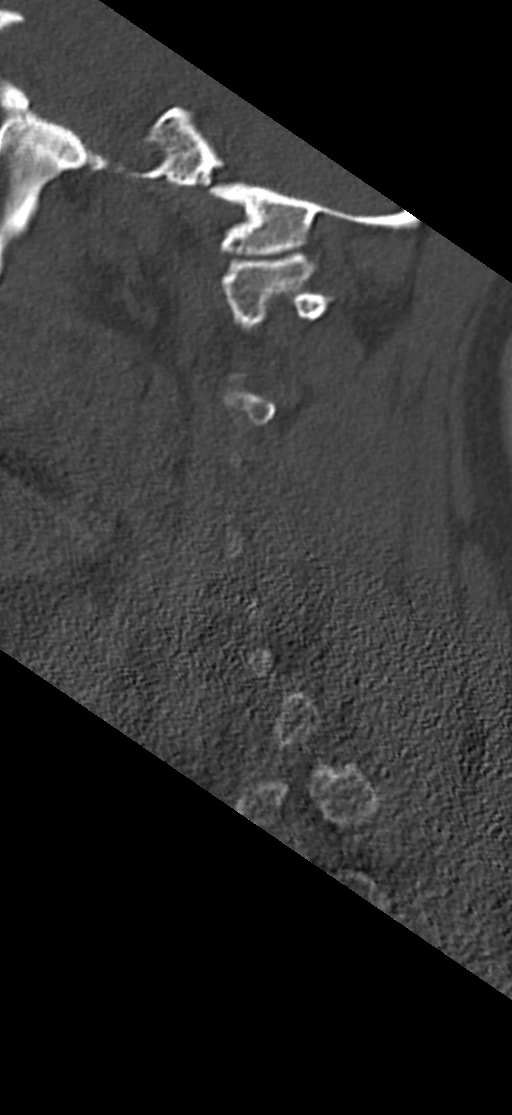
[im 55/66  bone]
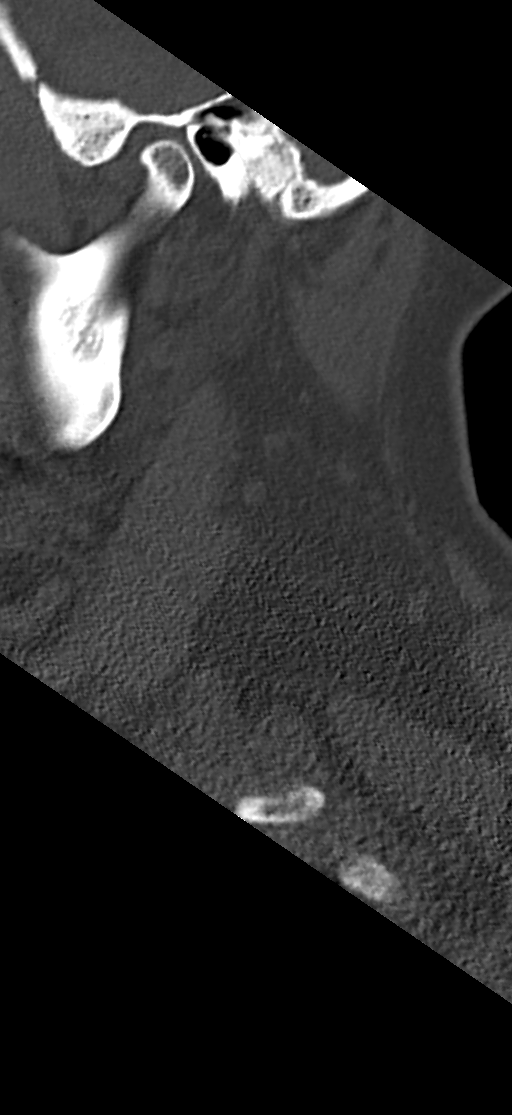

[Series 13: orthogonals · axial · 0.27mm/px · z∈[-288,-192]mm · 3 of 88 slices shown, 4 images]
[im 15/88  soft-tissue]
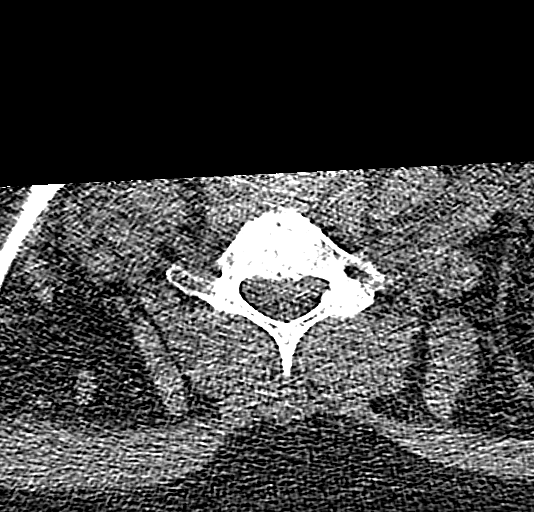
[im 15/88  bone]
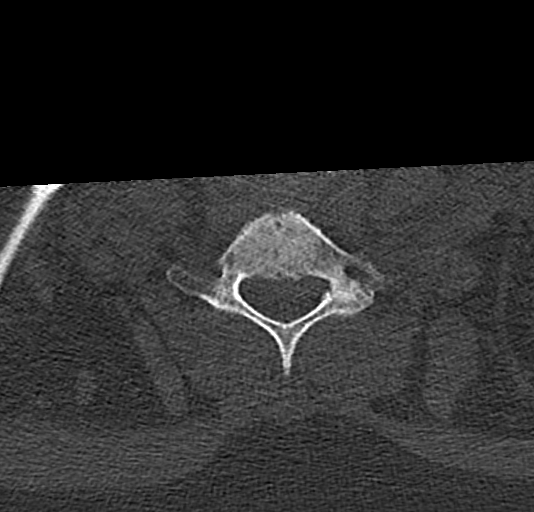
[im 44/88  bone]
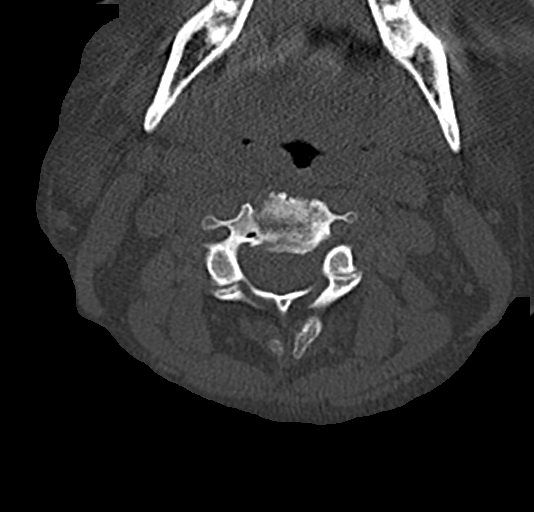
[im 73/88  bone]
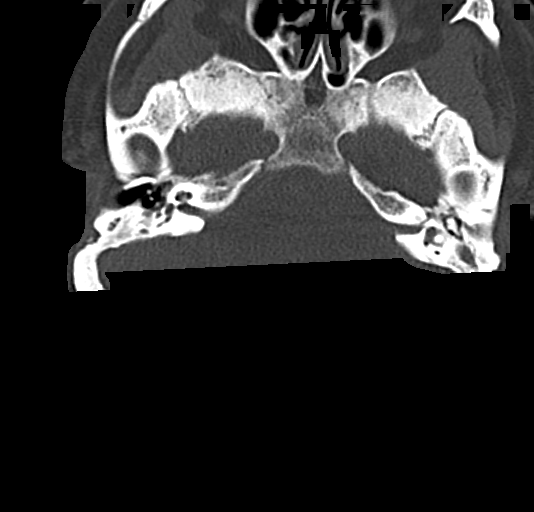

[11 of 33 positions shown; findings below may reference images not displayed]

FINDINGS: Please note that the study is severely limited due to motion
artifact, despite of repeated attempts.

Alignment: Minimal retrolistheses of C2 on C3 and minimal
anterolisthesis of C4 on C5. Reversal of cervical lordosis.

Skull base and vertebrae: No evidence of fracture, accounting for
the limitations of this study.

Soft tissues and spinal canal: No prevertebral fluid or swelling. No
visible canal hematoma.

Disc levels: Multilevel osteoarthritic changes and posterior facet
arthropathy.

Upper chest: Negative.

Other: None.
IMPRESSION: 1. Please note that this study is severely limited due to motion
artifact, despite of repeated scanning attempts. No evidence of
fracture, accounting for these limitations.
2. Minimal chronic retrolistheses of C2 on C3 and minimal chronic
anterolisthesis of C4 on C5, degenerative in etiology.
3. Multilevel osteoarthritic changes and posterior facet
arthropathy.

## 2020-09-23 DIAGNOSIS — Z23 Encounter for immunization: Secondary | ICD-10-CM | POA: Diagnosis not present

## 2020-09-23 DIAGNOSIS — Q909 Down syndrome, unspecified: Secondary | ICD-10-CM | POA: Diagnosis not present

## 2020-09-23 DIAGNOSIS — R1312 Dysphagia, oropharyngeal phase: Secondary | ICD-10-CM | POA: Diagnosis not present

## 2020-09-23 DIAGNOSIS — M6281 Muscle weakness (generalized): Secondary | ICD-10-CM | POA: Diagnosis not present

## 2020-09-23 DIAGNOSIS — G309 Alzheimer's disease, unspecified: Secondary | ICD-10-CM | POA: Diagnosis not present

## 2020-09-23 DIAGNOSIS — G4733 Obstructive sleep apnea (adult) (pediatric): Secondary | ICD-10-CM | POA: Diagnosis not present

## 2020-09-25 DIAGNOSIS — M6281 Muscle weakness (generalized): Secondary | ICD-10-CM | POA: Diagnosis not present

## 2020-09-25 DIAGNOSIS — R1312 Dysphagia, oropharyngeal phase: Secondary | ICD-10-CM | POA: Diagnosis not present

## 2020-09-25 DIAGNOSIS — G4733 Obstructive sleep apnea (adult) (pediatric): Secondary | ICD-10-CM | POA: Diagnosis not present

## 2020-09-25 DIAGNOSIS — G309 Alzheimer's disease, unspecified: Secondary | ICD-10-CM | POA: Diagnosis not present

## 2020-09-25 DIAGNOSIS — Q909 Down syndrome, unspecified: Secondary | ICD-10-CM | POA: Diagnosis not present

## 2020-09-25 DIAGNOSIS — Z23 Encounter for immunization: Secondary | ICD-10-CM | POA: Diagnosis not present

## 2020-09-26 DIAGNOSIS — G309 Alzheimer's disease, unspecified: Secondary | ICD-10-CM | POA: Diagnosis not present

## 2020-09-26 DIAGNOSIS — Q909 Down syndrome, unspecified: Secondary | ICD-10-CM | POA: Diagnosis not present

## 2020-09-26 DIAGNOSIS — R1312 Dysphagia, oropharyngeal phase: Secondary | ICD-10-CM | POA: Diagnosis not present

## 2020-09-26 DIAGNOSIS — M6281 Muscle weakness (generalized): Secondary | ICD-10-CM | POA: Diagnosis not present

## 2020-09-26 DIAGNOSIS — Z23 Encounter for immunization: Secondary | ICD-10-CM | POA: Diagnosis not present

## 2020-09-26 DIAGNOSIS — G4733 Obstructive sleep apnea (adult) (pediatric): Secondary | ICD-10-CM | POA: Diagnosis not present

## 2020-09-27 DIAGNOSIS — R1312 Dysphagia, oropharyngeal phase: Secondary | ICD-10-CM | POA: Diagnosis not present

## 2020-09-27 DIAGNOSIS — M6281 Muscle weakness (generalized): Secondary | ICD-10-CM | POA: Diagnosis not present

## 2020-09-27 DIAGNOSIS — G309 Alzheimer's disease, unspecified: Secondary | ICD-10-CM | POA: Diagnosis not present

## 2020-09-27 DIAGNOSIS — Q909 Down syndrome, unspecified: Secondary | ICD-10-CM | POA: Diagnosis not present

## 2020-09-27 DIAGNOSIS — G4733 Obstructive sleep apnea (adult) (pediatric): Secondary | ICD-10-CM | POA: Diagnosis not present

## 2020-09-27 DIAGNOSIS — Z23 Encounter for immunization: Secondary | ICD-10-CM | POA: Diagnosis not present

## 2020-09-28 DIAGNOSIS — G309 Alzheimer's disease, unspecified: Secondary | ICD-10-CM | POA: Diagnosis not present

## 2020-09-28 DIAGNOSIS — G4733 Obstructive sleep apnea (adult) (pediatric): Secondary | ICD-10-CM | POA: Diagnosis not present

## 2020-09-28 DIAGNOSIS — M6281 Muscle weakness (generalized): Secondary | ICD-10-CM | POA: Diagnosis not present

## 2020-09-28 DIAGNOSIS — Z23 Encounter for immunization: Secondary | ICD-10-CM | POA: Diagnosis not present

## 2020-09-28 DIAGNOSIS — Q909 Down syndrome, unspecified: Secondary | ICD-10-CM | POA: Diagnosis not present

## 2020-09-28 DIAGNOSIS — R1312 Dysphagia, oropharyngeal phase: Secondary | ICD-10-CM | POA: Diagnosis not present

## 2020-10-01 DIAGNOSIS — M6281 Muscle weakness (generalized): Secondary | ICD-10-CM | POA: Diagnosis not present

## 2020-10-01 DIAGNOSIS — Z23 Encounter for immunization: Secondary | ICD-10-CM | POA: Diagnosis not present

## 2020-10-01 DIAGNOSIS — G309 Alzheimer's disease, unspecified: Secondary | ICD-10-CM | POA: Diagnosis not present

## 2020-10-01 DIAGNOSIS — R1312 Dysphagia, oropharyngeal phase: Secondary | ICD-10-CM | POA: Diagnosis not present

## 2020-10-01 DIAGNOSIS — Q909 Down syndrome, unspecified: Secondary | ICD-10-CM | POA: Diagnosis not present

## 2020-10-01 DIAGNOSIS — G4733 Obstructive sleep apnea (adult) (pediatric): Secondary | ICD-10-CM | POA: Diagnosis not present

## 2020-10-02 DIAGNOSIS — Z23 Encounter for immunization: Secondary | ICD-10-CM | POA: Diagnosis not present

## 2020-10-02 DIAGNOSIS — M6281 Muscle weakness (generalized): Secondary | ICD-10-CM | POA: Diagnosis not present

## 2020-10-02 DIAGNOSIS — G309 Alzheimer's disease, unspecified: Secondary | ICD-10-CM | POA: Diagnosis not present

## 2020-10-02 DIAGNOSIS — G4733 Obstructive sleep apnea (adult) (pediatric): Secondary | ICD-10-CM | POA: Diagnosis not present

## 2020-10-02 DIAGNOSIS — Q909 Down syndrome, unspecified: Secondary | ICD-10-CM | POA: Diagnosis not present

## 2020-10-02 DIAGNOSIS — R1312 Dysphagia, oropharyngeal phase: Secondary | ICD-10-CM | POA: Diagnosis not present

## 2020-10-03 DIAGNOSIS — G309 Alzheimer's disease, unspecified: Secondary | ICD-10-CM | POA: Diagnosis not present

## 2020-10-03 DIAGNOSIS — R1312 Dysphagia, oropharyngeal phase: Secondary | ICD-10-CM | POA: Diagnosis not present

## 2020-10-03 DIAGNOSIS — Z23 Encounter for immunization: Secondary | ICD-10-CM | POA: Diagnosis not present

## 2020-10-03 DIAGNOSIS — G4733 Obstructive sleep apnea (adult) (pediatric): Secondary | ICD-10-CM | POA: Diagnosis not present

## 2020-10-03 DIAGNOSIS — Q909 Down syndrome, unspecified: Secondary | ICD-10-CM | POA: Diagnosis not present

## 2020-10-03 DIAGNOSIS — M6281 Muscle weakness (generalized): Secondary | ICD-10-CM | POA: Diagnosis not present

## 2020-10-16 DIAGNOSIS — G4701 Insomnia due to medical condition: Secondary | ICD-10-CM | POA: Diagnosis not present

## 2020-10-16 DIAGNOSIS — G309 Alzheimer's disease, unspecified: Secondary | ICD-10-CM | POA: Diagnosis not present

## 2020-10-16 DIAGNOSIS — G909 Disorder of the autonomic nervous system, unspecified: Secondary | ICD-10-CM | POA: Diagnosis not present

## 2020-10-16 DIAGNOSIS — F064 Anxiety disorder due to known physiological condition: Secondary | ICD-10-CM | POA: Diagnosis not present

## 2020-10-16 DIAGNOSIS — F0281 Dementia in other diseases classified elsewhere with behavioral disturbance: Secondary | ICD-10-CM | POA: Diagnosis not present

## 2020-10-16 DIAGNOSIS — R682 Dry mouth, unspecified: Secondary | ICD-10-CM | POA: Diagnosis not present

## 2020-10-16 DIAGNOSIS — G300 Alzheimer's disease with early onset: Secondary | ICD-10-CM | POA: Diagnosis not present

## 2020-10-16 DIAGNOSIS — Q909 Down syndrome, unspecified: Secondary | ICD-10-CM | POA: Diagnosis not present

## 2020-10-16 DIAGNOSIS — F411 Generalized anxiety disorder: Secondary | ICD-10-CM | POA: Diagnosis not present

## 2020-10-16 DIAGNOSIS — R451 Restlessness and agitation: Secondary | ICD-10-CM | POA: Diagnosis not present

## 2020-10-31 DIAGNOSIS — Z5181 Encounter for therapeutic drug level monitoring: Secondary | ICD-10-CM | POA: Diagnosis not present

## 2020-11-15 DIAGNOSIS — F064 Anxiety disorder due to known physiological condition: Secondary | ICD-10-CM | POA: Diagnosis not present

## 2020-11-15 DIAGNOSIS — G300 Alzheimer's disease with early onset: Secondary | ICD-10-CM | POA: Diagnosis not present

## 2020-11-15 DIAGNOSIS — G4701 Insomnia due to medical condition: Secondary | ICD-10-CM | POA: Diagnosis not present

## 2020-11-15 DIAGNOSIS — F0281 Dementia in other diseases classified elsewhere with behavioral disturbance: Secondary | ICD-10-CM | POA: Diagnosis not present

## 2020-12-02 ENCOUNTER — Other Ambulatory Visit: Payer: Self-pay

## 2020-12-02 ENCOUNTER — Encounter (HOSPITAL_COMMUNITY): Payer: Self-pay | Admitting: Emergency Medicine

## 2020-12-02 ENCOUNTER — Emergency Department (HOSPITAL_COMMUNITY): Payer: Medicare Other

## 2020-12-02 ENCOUNTER — Emergency Department (HOSPITAL_COMMUNITY)
Admission: EM | Admit: 2020-12-02 | Discharge: 2020-12-03 | Disposition: A | Discharge status: Home / Self Care | Payer: Medicare Other | Attending: Emergency Medicine | Admitting: Emergency Medicine

## 2020-12-02 DIAGNOSIS — G300 Alzheimer's disease with early onset: Secondary | ICD-10-CM | POA: Insufficient documentation

## 2020-12-02 DIAGNOSIS — E860 Dehydration: Secondary | ICD-10-CM | POA: Insufficient documentation

## 2020-12-02 DIAGNOSIS — U071 COVID-19: Secondary | ICD-10-CM | POA: Insufficient documentation

## 2020-12-02 DIAGNOSIS — Z7982 Long term (current) use of aspirin: Secondary | ICD-10-CM | POA: Insufficient documentation

## 2020-12-02 DIAGNOSIS — R509 Fever, unspecified: Secondary | ICD-10-CM | POA: Diagnosis present

## 2020-12-02 MED ORDER — SODIUM CHLORIDE 0.9 % IV BOLUS
1000.0000 mL | Freq: Once | INTRAVENOUS | Status: AC
  Administered 2020-12-03: 1000 mL via INTRAVENOUS

## 2020-12-02 NOTE — ED Triage Notes (Signed)
Patient arrives from [facility], hx down syndrome, non-verbal, and sleep apnea, presents with a fever, increased secretions per SNF, and cough. Patient noted to have multiple decubitus ulcers to bilateral buttock and legs

## 2020-12-03 DIAGNOSIS — U071 COVID-19: Secondary | ICD-10-CM | POA: Diagnosis not present

## 2020-12-03 LAB — URINALYSIS, ROUTINE W REFLEX MICROSCOPIC
Bacteria, UA: NONE SEEN
Bilirubin Urine: NEGATIVE
Glucose, UA: NEGATIVE mg/dL
Hgb urine dipstick: NEGATIVE
Ketones, ur: 20 mg/dL — AB
Leukocytes,Ua: NEGATIVE
Nitrite: NEGATIVE
Protein, ur: 30 mg/dL — AB
Specific Gravity, Urine: 1.033 — ABNORMAL HIGH (ref 1.005–1.030)
pH: 6 (ref 5.0–8.0)

## 2020-12-03 LAB — CBC WITH DIFFERENTIAL/PLATELET
Abs Immature Granulocytes: 0.26 10*3/uL — ABNORMAL HIGH (ref 0.00–0.07)
Basophils Absolute: 0.1 10*3/uL (ref 0.0–0.1)
Basophils Relative: 1 %
Eosinophils Absolute: 0 10*3/uL (ref 0.0–0.5)
Eosinophils Relative: 0 %
HCT: 46.4 % — ABNORMAL HIGH (ref 36.0–46.0)
Hemoglobin: 14.9 g/dL (ref 12.0–15.0)
Immature Granulocytes: 3 %
Lymphocytes Relative: 22 %
Lymphs Abs: 1.8 10*3/uL (ref 0.7–4.0)
MCH: 32.5 pg (ref 26.0–34.0)
MCHC: 32.1 g/dL (ref 30.0–36.0)
MCV: 101.3 fL — ABNORMAL HIGH (ref 80.0–100.0)
Monocytes Absolute: 0.5 10*3/uL (ref 0.1–1.0)
Monocytes Relative: 7 %
Neutro Abs: 5.4 10*3/uL (ref 1.7–7.7)
Neutrophils Relative %: 67 %
Platelets: 209 10*3/uL (ref 150–400)
RBC: 4.58 MIL/uL (ref 3.87–5.11)
RDW: 17.6 % — ABNORMAL HIGH (ref 11.5–15.5)
WBC: 8.2 10*3/uL (ref 4.0–10.5)
nRBC: 0 % (ref 0.0–0.2)

## 2020-12-03 LAB — COMPREHENSIVE METABOLIC PANEL
ALT: <5 U/L (ref 0–44)
AST: 30 U/L (ref 15–41)
Albumin: 2.5 g/dL — ABNORMAL LOW (ref 3.5–5.0)
Alkaline Phosphatase: 50 U/L (ref 38–126)
Anion gap: 13 (ref 5–15)
BUN: 15 mg/dL (ref 6–20)
CO2: 25 mmol/L (ref 22–32)
Calcium: 8.3 mg/dL — ABNORMAL LOW (ref 8.9–10.3)
Chloride: 112 mmol/L — ABNORMAL HIGH (ref 98–111)
Creatinine, Ser: 0.79 mg/dL (ref 0.44–1.00)
GFR, Estimated: >60 mL/min (ref >60)
Glucose, Bld: 78 mg/dL (ref 70–99)
Potassium: 4.5 mmol/L (ref 3.5–5.1)
Sodium: 150 mmol/L — ABNORMAL HIGH (ref 135–145)
Total Bilirubin: 2 mg/dL — ABNORMAL HIGH (ref 0.3–1.2)
Total Protein: 6.1 g/dL — ABNORMAL LOW (ref 6.5–8.1)

## 2020-12-03 LAB — RESP PANEL BY RT-PCR (FLU A&B, COVID) ARPGX2
Influenza A by PCR: NEGATIVE
Influenza B by PCR: NEGATIVE
SARS Coronavirus 2 by RT PCR: POSITIVE — AB

## 2020-12-03 LAB — LACTIC ACID, PLASMA: Lactic Acid, Venous: 1.6 mmol/L (ref 0.5–1.9)

## 2020-12-03 MED ORDER — SODIUM CHLORIDE 0.9 % IV SOLN: INTRAVENOUS | Status: DC

## 2020-12-03 MED ORDER — SODIUM CHLORIDE 0.9 % IV SOLN: Freq: Once | INTRAVENOUS | Status: AC

## 2020-12-03 MED ORDER — LIP MEDEX EX OINT
TOPICAL_OINTMENT | Freq: Once | CUTANEOUS | Status: AC
  Administered 2020-12-03: 1 via TOPICAL
  Filled 2020-12-03: qty 7

## 2020-12-03 NOTE — ED Notes (Signed)
X-ray at bedside

## 2020-12-03 NOTE — Discharge Instructions (Signed)
The patient has been diagnosed with COVID-19. He labs and chest x-ray are reassuring and show only dehydration. No hypoxia in the ED.   Please keep a close watch on her oxygen level, treat her fever appropriately, and keep her hydrated. Her doctor should recheck her condition in 24-48 hours.

## 2020-12-03 NOTE — ED Provider Notes (Signed)
[facility]-EMERGENCY DEPT Provider Note   CSN: [id number] Arrival date & time: 12/02/20  2301     History Chief Complaint  Patient presents with  . Fever    Jasmine Ramirez is a 55 y.o. female.  Patient with history of Down's Syndrome, early onset Alzheimers, [facility]. Per EMS, not much detail provided at time of their arrival. Unknown when fever began. It was reported to EMS that she is at her baseline mental status but had started a fever in the setting of increased nasal congestion. No reported COVID outbreaks in the facility.   The history is provided by the EMS personnel and medical records. No language interpreter was used.       Past Medical History:  Diagnosis Date  . Allergy 06/11/2018  . Chicken pox 06/11/2018  . Down syndrome 06/11/2018  . Heart murmur 06/11/2018  . Intellectual disability   . Myopia of both eyes     Patient Active Problem List   Diagnosis Date Noted  . Generalized weakness   . AMS (altered mental status) 03/26/2020  . Altered mental status 03/26/2020  . Hypoxia   . OSA (obstructive sleep apnea)   . Seizure (HCC) 08/26/2018  . VSD (ventricular septal defect), perimembranous   . Hyperextension injury of cervical spine, initial encounter 06/11/2018  . Alzheimer's disease with early onset (HCC) 06/11/2018  . Spinal cord injury to cervical region without bone injury (HCC) 06/11/2018  . Well adult exam 10/09/2015  . Abnormal TSH 10/09/2015  . Rash and nonspecific skin eruption 10/09/2015  . Depression 02/27/2014  . Down's syndrome 12/14/2013    Past Surgical History:  Procedure Laterality Date  . KNEE SURGERY  2013  . RADIOLOGY WITH ANESTHESIA N/A 06/12/2018   Procedure: MRI WITH ANESTHESIA;  Surgeon: Radiologist, Medication, MD;  Location: [facility] OR;  Service: Radiology;  Laterality: N/A;     OB History    Gravida  0   Para  0   Term  0   Preterm  0   AB  0    Living  0     SAB  0   IAB  0   Ectopic  0   Multiple  0   Live Births           Obstetric Comments  n/a        Family History  Problem Relation Age of Onset  . Cancer Father        Prostate  . Diabetes Father   . Cancer Maternal Grandmother        Breast  . Cancer Paternal Grandfather        Colon    Social History   Tobacco Use  . Smoking status: Never Smoker  . Smokeless tobacco: Never Used  Vaping Use  . Vaping Use: Never used  Substance Use Topics  . Alcohol use: Never  . Drug use: Never    Home Medications Prior to Admission medications   Medication Sig Start Date End Date Taking? Authorizing Provider  acetaminophen (TYLENOL) 325 MG tablet Take 650 mg by mouth 2 (two) times daily.     [provider]  Acetylcysteine (NAC) 600 MG CAPS Take 600 mg by mouth 2 (two) times daily.    [provider]  aspirin EC 81 MG tablet Take 81 mg by mouth daily.    [provider]  Calcium Carbonate (CALCIUM 600 PO) Take 600 mg by mouth 2 (  two) times daily.    [provider]  Cholecalciferol (VITAMIN D3) 2000 units TABS Take 2,000 Units by mouth daily.    [provider]  clonazePAM (KLONOPIN) 0.25 MG disintegrating tablet Take 1 tablet (0.25 mg total) by mouth daily for 4 days. 03/29/20 04/02/20  Sandre Kitty, MD  donepezil (ARICEPT) 5 MG tablet Take 5 mg by mouth at bedtime. 03/17/20   [provider]  levETIRAcetam (KEPPRA) 500 MG tablet Take 1 tablet (500 mg total) by mouth 2 (two) times daily. 09/25/18 03/25/20  Rehman, Areeg N, DO  levothyroxine (SYNTHROID) 75 MCG tablet Take 75 mcg by mouth daily before breakfast. 03/17/20   [provider]  melatonin 5 MG TABS Take 5 mg by mouth at bedtime.    [provider]  memantine (NAMENDA XR) 7 MG CP24 24 hr capsule Take 7 mg by mouth daily. 03/17/20   [provider]  Olopatadine HCl (EYE ALLERGY ITCH RELIEF) 0.2 % SOLN Place 1 drop into both  eyes 2 (two) times daily.    [provider]  Omega-3 1000 MG CAPS Take 1,000 mg by mouth 2 (two) times daily.    [provider]  simvastatin (ZOCOR) 20 MG tablet Take 20 mg by mouth every evening. 03/24/20   [provider]  sodium fluoride (DENTA 5000 PLUS) 1.1 % CREA dental cream Place 1 application onto teeth at bedtime.    [provider]  traZODone (DESYREL) 50 MG tablet Take 50 mg by mouth at bedtime. 03/17/20   [provider]  triamcinolone ointment (KENALOG) 0.1 % Apply 1 application topically See admin instructions. Apply topically three times daily until blisters are healed. 03/07/20   [provider]  pantoprazole (PROTONIX) 40 MG tablet Take 1 tablet (40 mg total) by mouth daily. Patient not taking: Reported on 08/16/2018 06/19/18 03/24/20  Alba Cory, MD    Allergies    Patient has no known allergies.  Review of Systems   Review of Systems  Unable to perform ROS: Patient nonverbal    Physical Exam Updated Vital Signs BP 98/67   Pulse 91   Temp (!) 102 F (38.9 C) (Rectal)   Resp (!) 22   Ht 4\' 8"  (1.422 m)   Wt 81.6 kg   SpO2 100%   BMI 40.36 kg/m   Physical Exam Vitals and nursing note reviewed.  Constitutional:      General: She is not in acute distress.    Appearance: She is well-developed and well-nourished. She is obese.  HENT:     Head: Normocephalic and atraumatic.     Mouth/Throat:     Mouth: Mucous membranes are dry.     Comments: Crusting on lips, thickened saliva.  Eyes:     Conjunctiva/sclera: Conjunctivae normal.  Cardiovascular:     Rate and Rhythm: Regular rhythm. Tachycardia present.  Pulmonary:     Effort: Pulmonary effort is normal.     Breath sounds: Normal breath sounds.     Comments: Poor effort on exam. Abdominal:     General: Bowel sounds are normal.     Palpations: Abdomen is soft.     Tenderness: There is no abdominal tenderness. There is no guarding or rebound.   Musculoskeletal:        General: Normal range of motion.     Cervical back: Normal range of motion and neck supple.  Skin:    General: Skin is warm and dry.     Findings: No erythema  or rash.     Comments: Multiple decubiti on buttocks, without surrounding redness or drainage. No malodor. Partial thickness.  There are 2 shallow ulcerations to right posterior leg. Dry bandages. No redness.   Neurological:     Mental Status: She is oriented to person, place, and time.  Psychiatric:        Mood and Affect: Mood and affect normal.     ED Results / Procedures / Treatments   Labs (all labs ordered are listed, but only abnormal results are displayed) Labs Reviewed  RESP PANEL BY RT-PCR (FLU A&B, COVID) ARPGX2  URINE CULTURE  CULTURE, BLOOD (ROUTINE X 2)  CULTURE, BLOOD (ROUTINE X 2)  CBC WITH DIFFERENTIAL/PLATELET  COMPREHENSIVE METABOLIC PANEL  LACTIC ACID, PLASMA  LACTIC ACID, PLASMA  URINALYSIS, ROUTINE W REFLEX MICROSCOPIC   Results for orders placed or performed during the hospital encounter of 12/02/20  Resp Panel by RT-PCR (Flu A&B, Covid) Nasopharyngeal Swab   Specimen: Nasopharyngeal Swab; Nasopharyngeal(NP) swabs in vial transport medium  Result Value Ref Range   SARS Coronavirus 2 by RT PCR POSITIVE (A) NEGATIVE   Influenza A by PCR NEGATIVE NEGATIVE   Influenza B by PCR NEGATIVE NEGATIVE  CBC with Differential  Result Value Ref Range   WBC 8.2 4.0 - 10.5 K/uL   RBC 4.58 3.87 - 5.11 MIL/uL   Hemoglobin 14.9 12.0 - 15.0 g/dL   HCT 16.9 (H) 67.8 - 93.8 %   MCV 101.3 (H) 80.0 - 100.0 fL   MCH 32.5 26.0 - 34.0 pg   MCHC 32.1 30.0 - 36.0 g/dL   RDW 10.1 (H) 75.1 - 02.5 %   Platelets 209 150 - 400 K/uL   nRBC 0.0 0.0 - 0.2 %   Neutrophils Relative % 67 %   Neutro Abs 5.4 1.7 - 7.7 K/uL   Lymphocytes Relative 22 %   Lymphs Abs 1.8 0.7 - 4.0 K/uL   Monocytes Relative 7 %   Monocytes Absolute 0.5 0.1 - 1.0 K/uL   Eosinophils Relative 0 %   Eosinophils Absolute 0.0  0.0 - 0.5 K/uL   Basophils Relative 1 %   Basophils Absolute 0.1 0.0 - 0.1 K/uL   Immature Granulocytes 3 %   Abs Immature Granulocytes 0.26 (H) 0.00 - 0.07 K/uL  Comprehensive metabolic panel  Result Value Ref Range   Sodium 150 (H) 135 - 145 mmol/L   Potassium 4.5 3.5 - 5.1 mmol/L   Chloride 112 (H) 98 - 111 mmol/L   CO2 25 22 - 32 mmol/L   Glucose, Bld 78 70 - 99 mg/dL   BUN 15 6 - 20 mg/dL   Creatinine, Ser 8.52 0.44 - 1.00 mg/dL   Calcium 8.3 (L) 8.9 - 10.3 mg/dL   Total Protein 6.1 (L) 6.5 - 8.1 g/dL   Albumin 2.5 (L) 3.5 - 5.0 g/dL   AST 30 15 - 41 U/L   ALT <5 0 - 44 U/L   Alkaline Phosphatase 50 38 - 126 U/L   Total Bilirubin 2.0 (H) 0.3 - 1.2 mg/dL   GFR, Estimated >77 >82 mL/min   Anion gap 13 5 - 15  Lactic acid, plasma  Result Value Ref Range   Lactic Acid, Venous 1.6 0.5 - 1.9 mmol/L  Urinalysis, Routine w reflex microscopic Urine, Clean Catch  Result Value Ref Range   Color, Urine AMBER (A) YELLOW   APPearance CLEAR CLEAR   Specific Gravity, Urine 1.033 (H) 1.005 - 1.030   pH 6.0 5.0 -  8.0   Glucose, UA NEGATIVE NEGATIVE mg/dL   Hgb urine dipstick NEGATIVE NEGATIVE   Bilirubin Urine NEGATIVE NEGATIVE   Ketones, ur 20 (A) NEGATIVE mg/dL   Protein, ur 30 (A) NEGATIVE mg/dL   Nitrite NEGATIVE NEGATIVE   Leukocytes,Ua NEGATIVE NEGATIVE   RBC / HPF 0-5 0 - 5 RBC/hpf   WBC, UA 0-5 0 - 5 WBC/hpf   Bacteria, UA NONE SEEN NONE SEEN   Squamous Epithelial / LPF 0-5 0 - 5   Mucus PRESENT     EKG None  Radiology No results found. DG Chest Portable 1 View  Result Date: 12/03/2020 CLINICAL DATA:  Fever and cough. EXAM: PORTABLE CHEST 1 VIEW COMPARISON:  Mar 25, 2020 FINDINGS: The heart size and mediastinal contours are within normal limits. Both lungs are clear. The visualized skeletal structures are unremarkable. IMPRESSION: No active disease. Electronically Signed   By: Katherine Mantle M.D.   On: 12/03/2020 00:43    Procedures Procedures (including  critical care time)  Medications Ordered in ED Medications  sodium chloride 0.9 % bolus 1,000 mL (1,000 mLs Intravenous New Bag/Given 12/03/20 0002)    ED Course  I have reviewed the triage vital signs and the nursing notes.  Pertinent labs & imaging results that were available during my care of the patient were reviewed by me and considered in my medical decision making (see chart for details).    MDM Rules/Calculators/A&P                          Patient to ED from [facility] with concern for fever, increased congestion. Patient is reported to be nonverbal at baseline.   Patient with tachycardia on arrival, febrile, hypotension. Sepsis workup started. She does not appear in any distress. No hypoxia.   Labs are overall reassuring, c/w significant dehydration, COVID positive. CXR does not show any disease. Lactic acid negative. Chart reviewed. Blood pressure tonight consistent with documented normal levels. Doubt sepsis.   The patient is bathed. Fever better with Tylenol. Appears more comfortable. She has been given IV hydration. She has not required supplemental oxygen. She is felt appropriate to be returned to the assisted living facility for further care.   Final Clinical Impression(s) / ED Diagnoses Final diagnoses:  None   1. COVID positive 2. Dehydration  Rx / DC Orders ED Discharge Orders    None       Elpidio Anis, Cordelia Poche 12/04/20 0439    Palumbo, April, MD 12/04/20 [phone]

## 2020-12-04 LAB — URINE CULTURE: Culture: NO GROWTH

## 2020-12-08 LAB — CULTURE, BLOOD (ROUTINE X 2)
Culture: NO GROWTH
Culture: NO GROWTH
Special Requests: ADEQUATE

## 2020-12-18 DEATH — deceased
